# Patient Record
Sex: Female | Born: 1940 | Race: White | Hispanic: No | Marital: Married | Smoking: Never smoker
Health system: Southern US, Community
[De-identification: ages and names within clinical notes are randomized; demographics above are authoritative.]

## PROBLEM LIST (undated history)

## (undated) DIAGNOSIS — F32A Depression, unspecified: Secondary | ICD-10-CM

## (undated) DIAGNOSIS — C49A4 Gastrointestinal stromal tumor of large intestine: Secondary | ICD-10-CM

## (undated) DIAGNOSIS — R251 Tremor, unspecified: Secondary | ICD-10-CM

## (undated) DIAGNOSIS — Z87442 Personal history of urinary calculi: Secondary | ICD-10-CM

## (undated) DIAGNOSIS — K219 Gastro-esophageal reflux disease without esophagitis: Secondary | ICD-10-CM

## (undated) DIAGNOSIS — C801 Malignant (primary) neoplasm, unspecified: Secondary | ICD-10-CM

## (undated) DIAGNOSIS — F329 Major depressive disorder, single episode, unspecified: Secondary | ICD-10-CM

## (undated) DIAGNOSIS — K589 Irritable bowel syndrome without diarrhea: Secondary | ICD-10-CM

## (undated) DIAGNOSIS — F419 Anxiety disorder, unspecified: Secondary | ICD-10-CM

## (undated) DIAGNOSIS — D649 Anemia, unspecified: Secondary | ICD-10-CM

## (undated) DIAGNOSIS — M1990 Unspecified osteoarthritis, unspecified site: Secondary | ICD-10-CM

## (undated) DIAGNOSIS — I10 Essential (primary) hypertension: Secondary | ICD-10-CM

## (undated) DIAGNOSIS — Z8719 Personal history of other diseases of the digestive system: Secondary | ICD-10-CM

## (undated) HISTORY — PX: BREAST BIOPSY: SHX20

## (undated) HISTORY — PX: COLON SURGERY: SHX602

## (undated) HISTORY — PX: STOMACH SURGERY: SHX791

## (undated) HISTORY — PX: ABDOMINAL HYSTERECTOMY: SHX81

## (undated) HISTORY — PX: CHOLECYSTECTOMY: SHX55

---

## 2004-02-20 ENCOUNTER — Ambulatory Visit: Payer: Self-pay | Admitting: Internal Medicine

## 2005-02-24 ENCOUNTER — Ambulatory Visit: Payer: Self-pay | Admitting: Internal Medicine

## 2005-03-09 ENCOUNTER — Ambulatory Visit: Payer: Self-pay | Admitting: Internal Medicine

## 2005-11-03 ENCOUNTER — Ambulatory Visit: Payer: Self-pay | Admitting: Internal Medicine

## 2006-03-01 ENCOUNTER — Ambulatory Visit: Payer: Self-pay | Admitting: Internal Medicine

## 2007-03-07 ENCOUNTER — Ambulatory Visit: Payer: Self-pay | Admitting: Internal Medicine

## 2008-03-12 ENCOUNTER — Ambulatory Visit: Payer: Self-pay | Admitting: Internal Medicine

## 2008-08-21 ENCOUNTER — Ambulatory Visit: Payer: Self-pay | Admitting: Otolaryngology

## 2008-11-06 ENCOUNTER — Ambulatory Visit: Payer: Self-pay | Admitting: Internal Medicine

## 2008-12-25 ENCOUNTER — Encounter: Payer: Self-pay | Admitting: General Practice

## 2009-01-21 ENCOUNTER — Encounter: Payer: Self-pay | Admitting: General Practice

## 2009-03-14 ENCOUNTER — Ambulatory Visit: Payer: Self-pay | Admitting: Internal Medicine

## 2009-10-16 ENCOUNTER — Ambulatory Visit: Payer: Self-pay | Admitting: Obstetrics and Gynecology

## 2009-10-29 ENCOUNTER — Ambulatory Visit: Payer: Self-pay | Admitting: Obstetrics and Gynecology

## 2009-10-31 LAB — PATHOLOGY REPORT

## 2009-11-28 ENCOUNTER — Ambulatory Visit: Payer: Self-pay | Admitting: Obstetrics and Gynecology

## 2009-12-05 ENCOUNTER — Ambulatory Visit: Payer: Self-pay | Admitting: Obstetrics and Gynecology

## 2009-12-06 LAB — PATHOLOGY REPORT

## 2010-03-18 ENCOUNTER — Ambulatory Visit: Payer: Self-pay | Admitting: Internal Medicine

## 2011-04-07 ENCOUNTER — Ambulatory Visit: Payer: Self-pay | Admitting: Internal Medicine

## 2012-02-01 ENCOUNTER — Ambulatory Visit: Payer: Self-pay | Admitting: Unknown Physician Specialty

## 2012-02-03 LAB — PATHOLOGY REPORT

## 2012-04-15 ENCOUNTER — Ambulatory Visit: Payer: Self-pay | Admitting: Internal Medicine

## 2013-04-17 ENCOUNTER — Ambulatory Visit: Payer: Self-pay | Admitting: Internal Medicine

## 2013-11-14 DIAGNOSIS — M5030 Other cervical disc degeneration, unspecified cervical region: Secondary | ICD-10-CM | POA: Insufficient documentation

## 2013-11-14 DIAGNOSIS — K219 Gastro-esophageal reflux disease without esophagitis: Secondary | ICD-10-CM | POA: Insufficient documentation

## 2013-11-14 DIAGNOSIS — E785 Hyperlipidemia, unspecified: Secondary | ICD-10-CM | POA: Insufficient documentation

## 2014-01-29 ENCOUNTER — Ambulatory Visit: Payer: Self-pay | Admitting: Internal Medicine

## 2014-02-06 ENCOUNTER — Ambulatory Visit: Payer: Self-pay | Admitting: Oncology

## 2014-02-06 LAB — COMPREHENSIVE METABOLIC PANEL
ALT: 18 U/L
Albumin: 3.7 g/dL (ref 3.4–5.0)
Alkaline Phosphatase: 67 U/L
Anion Gap: 8 (ref 7–16)
BILIRUBIN TOTAL: 0.3 mg/dL (ref 0.2–1.0)
BUN: 15 mg/dL (ref 7–18)
CALCIUM: 9.2 mg/dL (ref 8.5–10.1)
CHLORIDE: 103 mmol/L (ref 98–107)
Co2: 31 mmol/L (ref 21–32)
Creatinine: 0.68 mg/dL (ref 0.60–1.30)
EGFR (Non-African Amer.): >60
GLUCOSE: 88 mg/dL (ref 65–99)
Osmolality: 283 (ref 275–301)
POTASSIUM: 3.6 mmol/L (ref 3.5–5.1)
SGOT(AST): 19 U/L (ref 15–37)
SODIUM: 142 mmol/L (ref 136–145)
Total Protein: 6.9 g/dL (ref 6.4–8.2)

## 2014-02-06 LAB — CBC CANCER CENTER
Basophil #: 0.1 x10 3/mm (ref 0.0–0.1)
Basophil %: 0.8 %
EOS ABS: 0.2 x10 3/mm (ref 0.0–0.7)
Eosinophil %: 3.2 %
HCT: 32.8 % — ABNORMAL LOW (ref 35.0–47.0)
HGB: 10.6 g/dL — AB (ref 12.0–16.0)
LYMPHS ABS: 2 x10 3/mm (ref 1.0–3.6)
Lymphocyte %: 29.5 %
MCH: 28.4 pg (ref 26.0–34.0)
MCHC: 32.3 g/dL (ref 32.0–36.0)
MCV: 88 fL (ref 80–100)
MONOS PCT: 6.1 %
Monocyte #: 0.4 x10 3/mm (ref 0.2–0.9)
Neutrophil #: 4.2 x10 3/mm (ref 1.4–6.5)
Neutrophil %: 60.4 %
Platelet: 352 x10 3/mm (ref 150–440)
RBC: 3.74 10*6/uL — AB (ref 3.80–5.20)
RDW: 15.2 % — AB (ref 11.5–14.5)
WBC: 6.9 x10 3/mm (ref 3.6–11.0)

## 2014-02-07 LAB — PROTIME-INR
INR: 1
Prothrombin Time: 13.1 secs (ref 11.5–14.7)

## 2014-02-07 LAB — CA 125: CA 125: 11.1 U/mL (ref 0.0–34.0)

## 2014-02-07 LAB — CEA: CEA: 1.4 ng/mL (ref 0.0–4.7)

## 2014-02-07 LAB — CANCER ANTIGEN 19-9: CA 19-9: 31 U/mL (ref 0–35)

## 2014-02-07 LAB — APTT: ACTIVATED PTT: 33.5 s (ref 23.6–35.9)

## 2014-02-07 LAB — LACTATE DEHYDROGENASE: LDH: 652 U/L — ABNORMAL HIGH (ref 81–246)

## 2014-02-16 ENCOUNTER — Inpatient Hospital Stay: Payer: Self-pay | Admitting: Specialist

## 2014-02-16 LAB — CBC
HCT: 31.2 % — ABNORMAL LOW (ref 35.0–47.0)
HGB: 10 g/dL — AB (ref 12.0–16.0)
MCH: 28.5 pg (ref 26.0–34.0)
MCHC: 32.2 g/dL (ref 32.0–36.0)
MCV: 89 fL (ref 80–100)
PLATELETS: 301 10*3/uL (ref 150–440)
RBC: 3.51 10*6/uL — ABNORMAL LOW (ref 3.80–5.20)
RDW: 14.8 % — ABNORMAL HIGH (ref 11.5–14.5)
WBC: 14.4 10*3/uL — ABNORMAL HIGH (ref 3.6–11.0)

## 2014-02-16 LAB — URINALYSIS, COMPLETE
BILIRUBIN, UR: NEGATIVE
BLOOD: NEGATIVE
Bacteria: NONE SEEN
Glucose,UR: NEGATIVE mg/dL (ref 0–75)
Ketone: NEGATIVE
Leukocyte Esterase: NEGATIVE
Nitrite: NEGATIVE
Ph: 5 (ref 4.5–8.0)
Protein: NEGATIVE
RBC,UR: 1 /HPF (ref 0–5)
SPECIFIC GRAVITY: 1.02 (ref 1.003–1.030)
Squamous Epithelial: 1
WBC UR: <1 /HPF (ref 0–5)

## 2014-02-16 LAB — COMPREHENSIVE METABOLIC PANEL
ALT: 36 U/L
ANION GAP: 6 — AB (ref 7–16)
Albumin: 3.4 g/dL (ref 3.4–5.0)
Alkaline Phosphatase: 77 U/L
BUN: 18 mg/dL (ref 7–18)
Bilirubin,Total: 0.8 mg/dL (ref 0.2–1.0)
CALCIUM: 8.7 mg/dL (ref 8.5–10.1)
CHLORIDE: 100 mmol/L (ref 98–107)
CREATININE: 0.79 mg/dL (ref 0.60–1.30)
Co2: 29 mmol/L (ref 21–32)
EGFR (African American): >60
EGFR (Non-African Amer.): >60
Glucose: 133 mg/dL — ABNORMAL HIGH (ref 65–99)
Osmolality: 274 (ref 275–301)
Potassium: 2.8 mmol/L — ABNORMAL LOW (ref 3.5–5.1)
SGOT(AST): 49 U/L — ABNORMAL HIGH (ref 15–37)
SODIUM: 135 mmol/L — AB (ref 136–145)
Total Protein: 6.9 g/dL (ref 6.4–8.2)

## 2014-02-16 LAB — LIPASE, BLOOD: LIPASE: 81 U/L (ref 73–393)

## 2014-02-16 LAB — CLOSTRIDIUM DIFFICILE(ARMC)

## 2014-02-16 LAB — TROPONIN I

## 2014-02-17 LAB — CBC WITH DIFFERENTIAL/PLATELET
BANDS NEUTROPHIL: 30 %
BANDS NEUTROPHIL: 23 %
BASOS ABS: 0 10*3/uL (ref 0.0–0.1)
Basophil %: 0.2 %
EOS ABS: 0.1 10*3/uL (ref 0.0–0.7)
Eosinophil %: 0.3 %
HCT: 28.4 % — ABNORMAL LOW (ref 35.0–47.0)
HCT: 26.7 % — ABNORMAL LOW (ref 35.0–47.0)
HCT: 28.1 % — AB (ref 35.0–47.0)
HGB: 9.1 g/dL — ABNORMAL LOW (ref 12.0–16.0)
HGB: 8.5 g/dL — AB (ref 12.0–16.0)
HGB: 8.8 g/dL — ABNORMAL LOW (ref 12.0–16.0)
LYMPHS PCT: 6 %
LYMPHS PCT: 9 %
LYMPHS PCT: 7.2 %
Lymphocyte #: 1.2 10*3/uL (ref 1.0–3.6)
MCH: 28.7 pg (ref 26.0–34.0)
MCH: 28.4 pg (ref 26.0–34.0)
MCH: 28.4 pg (ref 26.0–34.0)
MCHC: 31.9 g/dL — ABNORMAL LOW (ref 32.0–36.0)
MCHC: 31.9 g/dL — ABNORMAL LOW (ref 32.0–36.0)
MCHC: 31.4 g/dL — ABNORMAL LOW (ref 32.0–36.0)
MCV: 90 fL (ref 80–100)
MCV: 89 fL (ref 80–100)
MCV: 91 fL (ref 80–100)
MONO ABS: 0.8 x10 3/mm (ref 0.2–0.9)
MONOS PCT: 3 %
MONOS PCT: 4.7 %
Metamyelocyte: 7 %
Metamyelocyte: 7 %
Monocytes: 4 %
Myelocyte: 1 %
Neutrophil #: 14.1 10*3/uL — ABNORMAL HIGH (ref 1.4–6.5)
Neutrophil %: 87.6 %
PLATELETS: 270 10*3/uL (ref 150–440)
Platelet: 293 10*3/uL (ref 150–440)
Platelet: 273 10*3/uL (ref 150–440)
RBC: 3.16 10*6/uL — AB (ref 3.80–5.20)
RBC: 3.01 10*6/uL — AB (ref 3.80–5.20)
RBC: 3.1 10*6/uL — ABNORMAL LOW (ref 3.80–5.20)
RDW: 15.1 % — ABNORMAL HIGH (ref 11.5–14.5)
RDW: 15.2 % — ABNORMAL HIGH (ref 11.5–14.5)
RDW: 15.3 % — ABNORMAL HIGH (ref 11.5–14.5)
SEGMENTED NEUTROPHILS: 58 %
Segmented Neutrophils: 52 %
WBC: 9.4 10*3/uL (ref 3.6–11.0)
WBC: 13.4 10*3/uL — ABNORMAL HIGH (ref 3.6–11.0)
WBC: 16.1 10*3/uL — AB (ref 3.6–11.0)

## 2014-02-17 LAB — BASIC METABOLIC PANEL
ANION GAP: 9 (ref 7–16)
ANION GAP: 10 (ref 7–16)
ANION GAP: 10 (ref 7–16)
BUN: 21 mg/dL — ABNORMAL HIGH (ref 7–18)
BUN: 24 mg/dL — ABNORMAL HIGH (ref 7–18)
BUN: 24 mg/dL — ABNORMAL HIGH (ref 7–18)
CHLORIDE: 106 mmol/L (ref 98–107)
CO2: 25 mmol/L (ref 21–32)
CO2: 21 mmol/L (ref 21–32)
CREATININE: 1.35 mg/dL — AB (ref 0.60–1.30)
Calcium, Total: 7.1 mg/dL — ABNORMAL LOW (ref 8.5–10.1)
Calcium, Total: 7.3 mg/dL — ABNORMAL LOW (ref 8.5–10.1)
Calcium, Total: 7.1 mg/dL — ABNORMAL LOW (ref 8.5–10.1)
Chloride: 104 mmol/L (ref 98–107)
Chloride: 106 mmol/L (ref 98–107)
Co2: 22 mmol/L (ref 21–32)
Creatinine: 1.39 mg/dL — ABNORMAL HIGH (ref 0.60–1.30)
Creatinine: 1.14 mg/dL (ref 0.60–1.30)
EGFR (African American): 48 — ABNORMAL LOW
EGFR (African American): 49 — ABNORMAL LOW
EGFR (African American): >60
EGFR (Non-African Amer.): 50 — ABNORMAL LOW
GFR CALC NON AF AMER: 39 — AB
GFR CALC NON AF AMER: 41 — AB
GLUCOSE: 104 mg/dL — AB (ref 65–99)
Glucose: 99 mg/dL (ref 65–99)
Glucose: 128 mg/dL — ABNORMAL HIGH (ref 65–99)
OSMOLALITY: 281 (ref 275–301)
OSMOLALITY: 278 (ref 275–301)
Osmolality: 279 (ref 275–301)
POTASSIUM: 2.4 mmol/L — AB (ref 3.5–5.1)
Potassium: 3 mmol/L — ABNORMAL LOW (ref 3.5–5.1)
Potassium: 3.6 mmol/L (ref 3.5–5.1)
SODIUM: 138 mmol/L (ref 136–145)
SODIUM: 137 mmol/L (ref 136–145)
Sodium: 138 mmol/L (ref 136–145)

## 2014-02-17 LAB — FOLATE: FOLIC ACID: 12.5 ng/mL (ref 3.1–100.0)

## 2014-02-17 LAB — MAGNESIUM
MAGNESIUM: 1.8 mg/dL
Magnesium: 1.1 mg/dL — ABNORMAL LOW

## 2014-02-17 LAB — CLOSTRIDIUM DIFFICILE(ARMC)

## 2014-02-17 LAB — FERRITIN: FERRITIN (ARMC): 291 ng/mL (ref 8–388)

## 2014-02-17 LAB — IRON AND TIBC
Iron Bind.Cap.(Total): 199 ug/dL — ABNORMAL LOW (ref 250–450)
Iron Saturation: 4 %
Iron: 8 ug/dL — ABNORMAL LOW (ref 50–170)
Unbound Iron-Bind.Cap.: 191 ug/dL

## 2014-02-17 LAB — PROTIME-INR
INR: 1.6
Prothrombin Time: 19.1 secs — ABNORMAL HIGH (ref 11.5–14.7)

## 2014-02-17 LAB — LACTATE DEHYDROGENASE: LDH: 417 U/L — ABNORMAL HIGH (ref 81–246)

## 2014-02-17 LAB — TROPONIN I: Troponin-I: 0.03 ng/mL

## 2014-02-17 LAB — OCCULT BLOOD X 1 CARD TO LAB, STOOL: Occult Blood, Feces: POSITIVE

## 2014-02-18 LAB — CBC WITH DIFFERENTIAL/PLATELET
Basophil #: 0 10*3/uL (ref 0.0–0.1)
Basophil %: 0.2 %
Eosinophil #: 0.2 10*3/uL (ref 0.0–0.7)
Eosinophil %: 1.1 %
HCT: 25.1 % — AB (ref 35.0–47.0)
HGB: 8.1 g/dL — AB (ref 12.0–16.0)
LYMPHS PCT: 6.2 %
Lymphocyte #: 1 10*3/uL (ref 1.0–3.6)
MCH: 28.9 pg (ref 26.0–34.0)
MCHC: 32.2 g/dL (ref 32.0–36.0)
MCV: 90 fL (ref 80–100)
MONO ABS: 0.5 x10 3/mm (ref 0.2–0.9)
Monocyte %: 3.2 %
NEUTROS ABS: 13.8 10*3/uL — AB (ref 1.4–6.5)
Neutrophil %: 89.3 %
PLATELETS: 226 10*3/uL (ref 150–440)
RBC: 2.8 10*6/uL — AB (ref 3.80–5.20)
RDW: 15.7 % — AB (ref 11.5–14.5)
WBC: 15.4 10*3/uL — AB (ref 3.6–11.0)

## 2014-02-18 LAB — URINE CULTURE

## 2014-02-18 LAB — BASIC METABOLIC PANEL
Anion Gap: 8 (ref 7–16)
BUN: 25 mg/dL — ABNORMAL HIGH (ref 7–18)
CO2: 22 mmol/L (ref 21–32)
Calcium, Total: 7.1 mg/dL — ABNORMAL LOW (ref 8.5–10.1)
Chloride: 106 mmol/L (ref 98–107)
Creatinine: 0.98 mg/dL (ref 0.60–1.30)
EGFR (African American): >60
EGFR (Non-African Amer.): 59 — ABNORMAL LOW
Glucose: 126 mg/dL — ABNORMAL HIGH (ref 65–99)
Osmolality: 278 (ref 275–301)
Potassium: 3.9 mmol/L (ref 3.5–5.1)
Sodium: 136 mmol/L (ref 136–145)

## 2014-02-19 LAB — CBC WITH DIFFERENTIAL/PLATELET
Basophil #: 0 10*3/uL (ref 0.0–0.1)
Basophil %: 0.2 %
EOS ABS: 0.1 10*3/uL (ref 0.0–0.7)
Eosinophil %: 0.7 %
HCT: 19.4 % — ABNORMAL LOW (ref 35.0–47.0)
HGB: 6.1 g/dL — ABNORMAL LOW (ref 12.0–16.0)
Lymphocyte #: 0.9 10*3/uL — ABNORMAL LOW (ref 1.0–3.6)
Lymphocyte %: 7.4 %
MCH: 28.4 pg (ref 26.0–34.0)
MCHC: 31.5 g/dL — AB (ref 32.0–36.0)
MCV: 90 fL (ref 80–100)
MONO ABS: 0.5 x10 3/mm (ref 0.2–0.9)
Monocyte %: 3.8 %
NEUTROS ABS: 11.2 10*3/uL — AB (ref 1.4–6.5)
NEUTROS PCT: 87.9 %
Platelet: 169 10*3/uL (ref 150–440)
RBC: 2.16 10*6/uL — AB (ref 3.80–5.20)
RDW: 16 % — AB (ref 11.5–14.5)
WBC: 12.8 10*3/uL — ABNORMAL HIGH (ref 3.6–11.0)

## 2014-02-19 LAB — COMPREHENSIVE METABOLIC PANEL
ALT: 15 U/L
ANION GAP: 6 — AB (ref 7–16)
Albumin: 1.5 g/dL — ABNORMAL LOW (ref 3.4–5.0)
Alkaline Phosphatase: 76 U/L
BUN: 15 mg/dL (ref 7–18)
Bilirubin,Total: 0.5 mg/dL (ref 0.2–1.0)
CO2: 18 mmol/L — AB (ref 21–32)
CREATININE: 0.63 mg/dL (ref 0.60–1.30)
Calcium, Total: 6.1 mg/dL — CL (ref 8.5–10.1)
Chloride: 117 mmol/L — ABNORMAL HIGH (ref 98–107)
EGFR (African American): >60
EGFR (Non-African Amer.): >60
GLUCOSE: 155 mg/dL — AB (ref 65–99)
Osmolality: 285 (ref 275–301)
POTASSIUM: 6.3 mmol/L — AB (ref 3.5–5.1)
SGOT(AST): 18 U/L (ref 15–37)
SODIUM: 141 mmol/L (ref 136–145)
Total Protein: 4.2 g/dL — ABNORMAL LOW (ref 6.4–8.2)

## 2014-02-19 LAB — BASIC METABOLIC PANEL
Anion Gap: 7 (ref 7–16)
BUN: 16 mg/dL (ref 7–18)
CALCIUM: 7.7 mg/dL — AB (ref 8.5–10.1)
CHLORIDE: 109 mmol/L — AB (ref 98–107)
CREATININE: 0.84 mg/dL (ref 0.60–1.30)
Co2: 25 mmol/L (ref 21–32)
Glucose: 111 mg/dL — ABNORMAL HIGH (ref 65–99)
Osmolality: 283 (ref 275–301)
POTASSIUM: 3.8 mmol/L (ref 3.5–5.1)
SODIUM: 141 mmol/L (ref 136–145)

## 2014-02-20 ENCOUNTER — Ambulatory Visit: Payer: Self-pay | Admitting: Oncology

## 2014-02-20 LAB — COMPREHENSIVE METABOLIC PANEL
ALK PHOS: 141 U/L — AB
Albumin: 1.9 g/dL — ABNORMAL LOW (ref 3.4–5.0)
Anion Gap: 9 (ref 7–16)
BUN: 17 mg/dL (ref 7–18)
Bilirubin,Total: 1.2 mg/dL — ABNORMAL HIGH (ref 0.2–1.0)
Calcium, Total: 8 mg/dL — ABNORMAL LOW (ref 8.5–10.1)
Chloride: 108 mmol/L — ABNORMAL HIGH (ref 98–107)
Co2: 23 mmol/L (ref 21–32)
Creatinine: 0.83 mg/dL (ref 0.60–1.30)
EGFR (Non-African Amer.): >60
Glucose: 87 mg/dL (ref 65–99)
Osmolality: 280 (ref 275–301)
POTASSIUM: 3.3 mmol/L — AB (ref 3.5–5.1)
SGOT(AST): 24 U/L (ref 15–37)
SGPT (ALT): 15 U/L
Sodium: 140 mmol/L (ref 136–145)
Total Protein: 5.3 g/dL — ABNORMAL LOW (ref 6.4–8.2)

## 2014-02-20 LAB — CBC WITH DIFFERENTIAL/PLATELET
Bands: 3 %
HCT: 31.1 % — AB (ref 35.0–47.0)
HGB: 10.3 g/dL — ABNORMAL LOW (ref 12.0–16.0)
Lymphocytes: 17 %
MCH: 29.2 pg (ref 26.0–34.0)
MCHC: 33.2 g/dL (ref 32.0–36.0)
MCV: 88 fL (ref 80–100)
MONOS PCT: 4 %
Metamyelocyte: 1 %
Myelocyte: 1 %
Platelet: 183 10*3/uL (ref 150–440)
RBC: 3.53 10*6/uL — ABNORMAL LOW (ref 3.80–5.20)
RDW: 15.8 % — ABNORMAL HIGH (ref 11.5–14.5)
Segmented Neutrophils: 74 %
WBC: 12.4 10*3/uL — AB (ref 3.6–11.0)

## 2014-02-20 LAB — APTT: ACTIVATED PTT: 31 s (ref 23.6–35.9)

## 2014-02-20 LAB — PROTIME-INR
INR: 1.1
Prothrombin Time: 14 secs (ref 11.5–14.7)

## 2014-02-21 LAB — BASIC METABOLIC PANEL
Anion Gap: 8 (ref 7–16)
BUN: 19 mg/dL — ABNORMAL HIGH (ref 7–18)
CALCIUM: 8.1 mg/dL — AB (ref 8.5–10.1)
CHLORIDE: 107 mmol/L (ref 98–107)
Co2: 24 mmol/L (ref 21–32)
Creatinine: 0.82 mg/dL (ref 0.60–1.30)
EGFR (Non-African Amer.): >60
GLUCOSE: 106 mg/dL — AB (ref 65–99)
OSMOLALITY: 280 (ref 275–301)
Potassium: 3 mmol/L — ABNORMAL LOW (ref 3.5–5.1)
Sodium: 139 mmol/L (ref 136–145)

## 2014-02-21 LAB — CBC WITH DIFFERENTIAL/PLATELET
Bands: 2 %
EOS PCT: 1 %
HCT: 33.1 % — ABNORMAL LOW (ref 35.0–47.0)
HGB: 11 g/dL — AB (ref 12.0–16.0)
Lymphocytes: 13 %
MCH: 29.2 pg (ref 26.0–34.0)
MCHC: 33.3 g/dL (ref 32.0–36.0)
MCV: 88 fL (ref 80–100)
METAMYELOCYTE: 5 %
MONOS PCT: 7 %
Myelocyte: 2 %
Platelet: 175 10*3/uL (ref 150–440)
RBC: 3.77 10*6/uL — ABNORMAL LOW (ref 3.80–5.20)
RDW: 16.1 % — AB (ref 11.5–14.5)
Segmented Neutrophils: 70 %
WBC: 12.3 10*3/uL — AB (ref 3.6–11.0)

## 2014-02-21 LAB — CULTURE, BLOOD (SINGLE)

## 2014-02-22 LAB — CBC WITH DIFFERENTIAL/PLATELET
Bands: 3 %
Basophil: 1 %
Eosinophil: 2 %
HCT: 35.1 % (ref 35.0–47.0)
HGB: 11.5 g/dL — ABNORMAL LOW (ref 12.0–16.0)
Lymphocytes: 11 %
MCH: 29 pg (ref 26.0–34.0)
MCHC: 32.9 g/dL (ref 32.0–36.0)
MCV: 88 fL (ref 80–100)
Metamyelocyte: 1 %
Monocytes: 7 %
Myelocyte: 4 %
Platelet: 208 10*3/uL (ref 150–440)
RBC: 3.98 10*6/uL (ref 3.80–5.20)
RDW: 16.1 % — ABNORMAL HIGH (ref 11.5–14.5)
Segmented Neutrophils: 71 %
WBC: 17.1 10*3/uL — ABNORMAL HIGH (ref 3.6–11.0)

## 2014-02-22 LAB — BASIC METABOLIC PANEL
Anion Gap: 11 (ref 7–16)
BUN: 14 mg/dL (ref 7–18)
CREATININE: 0.45 mg/dL — AB (ref 0.60–1.30)
Calcium, Total: 5.6 mg/dL — CL (ref 8.5–10.1)
Chloride: 117 mmol/L — ABNORMAL HIGH (ref 98–107)
Co2: 17 mmol/L — ABNORMAL LOW (ref 21–32)
EGFR (African American): >60
Glucose: 75 mg/dL (ref 65–99)
OSMOLALITY: 288 (ref 275–301)
Potassium: 2.3 mmol/L — CL (ref 3.5–5.1)
SODIUM: 145 mmol/L (ref 136–145)

## 2014-02-22 LAB — MAGNESIUM: Magnesium: 1.1 mg/dL — ABNORMAL LOW

## 2014-02-26 LAB — CBC CANCER CENTER
Basophil #: 0 x10 3/mm (ref 0.0–0.1)
Basophil %: 0.2 %
EOS ABS: 0.1 x10 3/mm (ref 0.0–0.7)
EOS PCT: 0.6 %
HCT: 35.2 % (ref 35.0–47.0)
HGB: 11.5 g/dL — AB (ref 12.0–16.0)
LYMPHS ABS: 1.7 x10 3/mm (ref 1.0–3.6)
Lymphocyte %: 10 %
MCH: 28.7 pg (ref 26.0–34.0)
MCHC: 32.7 g/dL (ref 32.0–36.0)
MCV: 88 fL (ref 80–100)
MONO ABS: 0.7 x10 3/mm (ref 0.2–0.9)
MONOS PCT: 4 %
Neutrophil #: 14.6 x10 3/mm — ABNORMAL HIGH (ref 1.4–6.5)
Neutrophil %: 85.2 %
PLATELETS: 399 x10 3/mm (ref 150–440)
RBC: 4.01 10*6/uL (ref 3.80–5.20)
RDW: 16.4 % — ABNORMAL HIGH (ref 11.5–14.5)
WBC: 17.2 x10 3/mm — ABNORMAL HIGH (ref 3.6–11.0)

## 2014-02-26 LAB — COMPREHENSIVE METABOLIC PANEL
ALBUMIN: 2.3 g/dL — AB (ref 3.4–5.0)
ALK PHOS: 126 U/L — AB
Anion Gap: 9 (ref 7–16)
BILIRUBIN TOTAL: 0.3 mg/dL (ref 0.2–1.0)
BUN: 8 mg/dL (ref 7–18)
CO2: 28 mmol/L (ref 21–32)
Calcium, Total: 8.7 mg/dL (ref 8.5–10.1)
Chloride: 102 mmol/L (ref 98–107)
Creatinine: 0.75 mg/dL (ref 0.60–1.30)
EGFR (Non-African Amer.): >60
Glucose: 92 mg/dL (ref 65–99)
Osmolality: 276 (ref 275–301)
POTASSIUM: 4.4 mmol/L (ref 3.5–5.1)
SGOT(AST): 22 U/L (ref 15–37)
SGPT (ALT): 19 U/L
Sodium: 139 mmol/L (ref 136–145)
Total Protein: 6.1 g/dL — ABNORMAL LOW (ref 6.4–8.2)

## 2014-03-01 ENCOUNTER — Ambulatory Visit: Payer: Self-pay | Admitting: Oncology

## 2014-03-09 ENCOUNTER — Ambulatory Visit: Payer: Self-pay | Admitting: Gastroenterology

## 2014-03-14 LAB — CBC CANCER CENTER
Basophil #: 0 x10 3/mm (ref 0.0–0.1)
Basophil %: 0.6 %
EOS PCT: 4.2 %
Eosinophil #: 0.3 x10 3/mm (ref 0.0–0.7)
HCT: 34 % — ABNORMAL LOW (ref 35.0–47.0)
HGB: 11.1 g/dL — ABNORMAL LOW (ref 12.0–16.0)
Lymphocyte #: 1.7 x10 3/mm (ref 1.0–3.6)
Lymphocyte %: 23.2 %
MCH: 29 pg (ref 26.0–34.0)
MCHC: 32.6 g/dL (ref 32.0–36.0)
MCV: 89 fL (ref 80–100)
MONOS PCT: 7.5 %
Monocyte #: 0.5 x10 3/mm (ref 0.2–0.9)
Neutrophil #: 4.7 x10 3/mm (ref 1.4–6.5)
Neutrophil %: 64.5 %
PLATELETS: 361 x10 3/mm (ref 150–440)
RBC: 3.82 10*6/uL (ref 3.80–5.20)
RDW: 16.5 % — AB (ref 11.5–14.5)
WBC: 7.2 x10 3/mm (ref 3.6–11.0)

## 2014-03-23 ENCOUNTER — Ambulatory Visit: Payer: Self-pay | Admitting: Oncology

## 2014-04-02 LAB — CBC CANCER CENTER
BASOS ABS: 0.1 x10 3/mm (ref 0.0–0.1)
BASOS PCT: 0.8 %
Eosinophil #: 0.2 x10 3/mm (ref 0.0–0.7)
Eosinophil %: 1.4 %
HCT: 32.4 % — AB (ref 35.0–47.0)
HGB: 10.7 g/dL — AB (ref 12.0–16.0)
Lymphocyte #: 1.9 x10 3/mm (ref 1.0–3.6)
Lymphocyte %: 17.4 %
MCH: 29.2 pg (ref 26.0–34.0)
MCHC: 33.1 g/dL (ref 32.0–36.0)
MCV: 88 fL (ref 80–100)
Monocyte #: 0.7 x10 3/mm (ref 0.2–0.9)
Monocyte %: 6.5 %
NEUTROS ABS: 8 x10 3/mm — AB (ref 1.4–6.5)
Neutrophil %: 73.9 %
Platelet: 480 x10 3/mm — ABNORMAL HIGH (ref 150–440)
RBC: 3.68 10*6/uL — ABNORMAL LOW (ref 3.80–5.20)
RDW: 15.8 % — ABNORMAL HIGH (ref 11.5–14.5)
WBC: 10.8 x10 3/mm (ref 3.6–11.0)

## 2014-04-02 LAB — COMPREHENSIVE METABOLIC PANEL
ALT: 17 U/L
AST: 16 U/L (ref 15–37)
Albumin: 2.4 g/dL — ABNORMAL LOW (ref 3.4–5.0)
Alkaline Phosphatase: 75 U/L
Anion Gap: 8 (ref 7–16)
BUN: 12 mg/dL (ref 7–18)
Bilirubin,Total: 0.2 mg/dL (ref 0.2–1.0)
CALCIUM: 7.7 mg/dL — AB (ref 8.5–10.1)
CHLORIDE: 101 mmol/L (ref 98–107)
Co2: 29 mmol/L (ref 21–32)
Creatinine: 0.87 mg/dL (ref 0.60–1.30)
EGFR (African American): >60
EGFR (Non-African Amer.): >60
Glucose: 118 mg/dL — ABNORMAL HIGH (ref 65–99)
Osmolality: 277 (ref 275–301)
POTASSIUM: 3.1 mmol/L — AB (ref 3.5–5.1)
Sodium: 138 mmol/L (ref 136–145)
Total Protein: 5.9 g/dL — ABNORMAL LOW (ref 6.4–8.2)

## 2014-04-04 DIAGNOSIS — C49A0 Gastrointestinal stromal tumor, unspecified site: Secondary | ICD-10-CM | POA: Insufficient documentation

## 2014-04-04 DIAGNOSIS — C269 Malignant neoplasm of ill-defined sites within the digestive system: Secondary | ICD-10-CM | POA: Insufficient documentation

## 2014-04-23 ENCOUNTER — Ambulatory Visit: Payer: Self-pay | Admitting: Oncology

## 2014-04-23 LAB — COMPREHENSIVE METABOLIC PANEL WITH GFR
Albumin: 2.5 g/dL — ABNORMAL LOW
Alkaline Phosphatase: 68 U/L
Anion Gap: 6 — ABNORMAL LOW
BUN: 11 mg/dL
Bilirubin,Total: 0.3 mg/dL
Calcium, Total: 7.9 mg/dL — ABNORMAL LOW
Chloride: 103 mmol/L
Co2: 33 mmol/L — ABNORMAL HIGH
Creatinine: 0.81 mg/dL
EGFR (African American): >60
EGFR (Non-African Amer.): >60
Glucose: 131 mg/dL — ABNORMAL HIGH
Osmolality: 284
Potassium: 3.1 mmol/L — ABNORMAL LOW
SGOT(AST): 19 U/L
SGPT (ALT): 12 U/L — ABNORMAL LOW
Sodium: 142 mmol/L
Total Protein: 6 g/dL — ABNORMAL LOW

## 2014-04-23 LAB — CBC CANCER CENTER
Basophil #: 0.1 10*3/uL
Basophil %: 1 %
Eosinophil #: 0.1 10*3/uL
Eosinophil %: 2.6 %
HCT: 30.2 % — ABNORMAL LOW
HGB: 10.1 g/dL — ABNORMAL LOW
Lymphocyte %: 27.4 %
Lymphs Abs: 1.5 10*3/uL
MCH: 30.6 pg
MCHC: 33.3 g/dL
MCV: 92 fL
Monocyte #: 0.4 10*3/uL
Monocyte %: 8 %
Neutrophil #: 3.3 10*3/uL
Neutrophil %: 61 %
Platelet: 246 10*3/uL
RBC: 3.29 10*6/uL — ABNORMAL LOW
RDW: 18.8 % — ABNORMAL HIGH
WBC: 5.5 10*3/uL

## 2014-04-23 LAB — IRON AND TIBC
Iron Bind.Cap.(Total): 201 ug/dL — ABNORMAL LOW
Iron Saturation: 28 %
Iron: 57 ug/dL
Unbound Iron-Bind.Cap.: 144 ug/dL

## 2014-04-23 LAB — FERRITIN: Ferritin (ARMC): 124 ng/mL

## 2014-04-25 ENCOUNTER — Ambulatory Visit: Payer: Self-pay | Admitting: Internal Medicine

## 2014-05-11 DIAGNOSIS — K5792 Diverticulitis of intestine, part unspecified, without perforation or abscess without bleeding: Secondary | ICD-10-CM | POA: Insufficient documentation

## 2014-05-22 ENCOUNTER — Ambulatory Visit
Admit: 2014-05-22 | Disposition: A | Discharge status: Home / Self Care | Payer: Self-pay | Attending: Oncology | Admitting: Oncology

## 2014-06-22 ENCOUNTER — Ambulatory Visit
Admit: 2014-06-22 | Disposition: A | Discharge status: Home / Self Care | Payer: Self-pay | Attending: Oncology | Admitting: Oncology

## 2014-07-05 LAB — CBC CANCER CENTER
Basophil #: 0.1 x10 3/mm (ref 0.0–0.1)
Basophil %: 1 %
EOS PCT: 1.6 %
Eosinophil #: 0.1 x10 3/mm (ref 0.0–0.7)
HCT: 34.5 % — ABNORMAL LOW (ref 35.0–47.0)
HGB: 11.6 g/dL — ABNORMAL LOW (ref 12.0–16.0)
LYMPHS ABS: 1.3 x10 3/mm (ref 1.0–3.6)
Lymphocyte %: 19.6 %
MCH: 30.1 pg (ref 26.0–34.0)
MCHC: 33.7 g/dL (ref 32.0–36.0)
MCV: 90 fL (ref 80–100)
MONO ABS: 0.6 x10 3/mm (ref 0.2–0.9)
Monocyte %: 8.3 %
NEUTROS ABS: 4.7 x10 3/mm (ref 1.4–6.5)
Neutrophil %: 69.5 %
Platelet: 532 x10 3/mm — ABNORMAL HIGH (ref 150–440)
RBC: 3.86 10*6/uL (ref 3.80–5.20)
RDW: 15.6 % — ABNORMAL HIGH (ref 11.5–14.5)
WBC: 6.8 x10 3/mm (ref 3.6–11.0)

## 2014-07-05 LAB — COMPREHENSIVE METABOLIC PANEL
ALBUMIN: 3 g/dL — AB
ALT: 10 U/L — AB
AST: 18 U/L
Alkaline Phosphatase: 94 U/L
Anion Gap: 8 (ref 7–16)
BILIRUBIN TOTAL: 0.6 mg/dL
BUN: 9 mg/dL
CREATININE: 0.59 mg/dL
Calcium, Total: 8.8 mg/dL — ABNORMAL LOW
Chloride: 97 mmol/L — ABNORMAL LOW
Co2: 33 mmol/L — ABNORMAL HIGH
Glucose: 134 mg/dL — ABNORMAL HIGH
POTASSIUM: 2.9 mmol/L — AB
Sodium: 138 mmol/L
Total Protein: 6.5 g/dL

## 2014-07-05 LAB — MAGNESIUM: MAGNESIUM: 1.1 mg/dL — AB

## 2014-07-12 LAB — CBC CANCER CENTER
BASOS PCT: 0.8 %
Basophil #: 0 x10 3/mm (ref 0.0–0.1)
Eosinophil #: 0.1 x10 3/mm (ref 0.0–0.7)
Eosinophil %: 2.2 %
HCT: 36.3 % (ref 35.0–47.0)
HGB: 12 g/dL (ref 12.0–16.0)
LYMPHS ABS: 1.3 x10 3/mm (ref 1.0–3.6)
Lymphocyte %: 21.3 %
MCH: 30.2 pg (ref 26.0–34.0)
MCHC: 33 g/dL (ref 32.0–36.0)
MCV: 92 fL (ref 80–100)
MONO ABS: 0.4 x10 3/mm (ref 0.2–0.9)
Monocyte %: 6.5 %
Neutrophil #: 4.1 x10 3/mm (ref 1.4–6.5)
Neutrophil %: 69.2 %
Platelet: 431 x10 3/mm (ref 150–440)
RBC: 3.96 10*6/uL (ref 3.80–5.20)
RDW: 16.2 % — ABNORMAL HIGH (ref 11.5–14.5)
WBC: 5.9 x10 3/mm (ref 3.6–11.0)

## 2014-07-12 LAB — COMPREHENSIVE METABOLIC PANEL
ALK PHOS: 73 U/L
ALT: 11 U/L — AB
Albumin: 3.3 g/dL — ABNORMAL LOW
Anion Gap: 5 — ABNORMAL LOW (ref 7–16)
BILIRUBIN TOTAL: 0.5 mg/dL
BUN: 13 mg/dL
CREATININE: 0.49 mg/dL
Calcium, Total: 9.1 mg/dL
Chloride: 104 mmol/L
Co2: 31 mmol/L
EGFR (African American): >60
EGFR (Non-African Amer.): >60
GLUCOSE: 100 mg/dL — AB
POTASSIUM: 3.1 mmol/L — AB
SGOT(AST): 15 U/L
SODIUM: 140 mmol/L
TOTAL PROTEIN: 6.8 g/dL

## 2014-07-12 LAB — MAGNESIUM: MAGNESIUM: 1.3 mg/dL — AB

## 2014-07-14 NOTE — H&P (Signed)
PATIENT NAME:  Courtney [name], Courtney [name] MR#:  [id number] DATE OF BIRTH:  [birth year]  DATE OF ADMISSION:  02/16/2014  PRIMARY CARE PHYSICIAN:  Dr. Emily Filbert.   CHIEF COMPLAINT: Altered mental status and fever.   HISTORY OF PRESENT ILLNESS: This is a 74 year old female who was sent over to the ER from special [facility] due to fever and altered mental status. The patient herself presently is not a good historian, therefore most of the history obtained from the son at bedside. As per the son the patient was in her usual state of health and doing well yesterday evening when he had dinner with her. She was recently diagnosed with an intra-abdominal mass and was going to get a biopsy done today at [facility]. Prior to starting the procedure the patient was lethargic, noted to be febrile and altered. She was therefore sent over to the ER for further evaluation. In the ER the patient had a fever of 101, was noted to be tachycardic and tachypneic, diagnosed with suspected sepsis but the source still being unclear. Hospitalist services were contacted for treatment and evaluation.   REVIEW OF SYSTEMS:   CONSTITUTIONAL: Positive documented fever of 101. No weight gain, no weight loss.  EYES: No blurry or double vision.  EARS, NOSE, AND THROAT: No tinnitus. No postnasal drip. No redness of the oropharynx.  RESPIRATORY: No cough, no wheeze, no hemoptysis, no dyspnea.  CARDIOVASCULAR: No chest pain, no orthopnea, no palpitation, no syncope.  GASTROINTESTINAL: No nausea. No vomiting. No diarrhea. Positive abdominal pain. No melena or hematochezia.  GENITOURINARY: No dysuria or hematuria.  ENDOCRINE: No polyuria, nocturia, heat or intolerance.  HEMATOLOGIC: No anemia, no bruising, no bleeding.  INTEGUMENTARY: No rashes. No lesions.  MUSCULOSKELETAL: No arthritis. No swelling. No gout.  NEUROLOGIC: No numbness, tingling, or ataxia. No seizure-type activity.   PSYCHIATRIC: No anxiety, no insomnia. No ADD.   PAST  MEDICAL HISTORY: Consistent with hypertension, GERD, hyperlipidemia, recently diagnosed intra-abdominal mass.   ALLERGIES: PENICILLIN, WHICH CAUSES HIVES.   SOCIAL HISTORY: No smoking. No alcohol abuse. No illicit drug abuse. Lives with her husband.   FAMILY HISTORY: Mother and father are both deceased. They both died from complications of heart disease. Diabetes does run in the mother's side of family.   CURRENT MEDICATIONS: Hydrochlorothiazide/lisinopril 12.5/20 one tab daily, Nexium 40 mg daily, Ocuvite 2 tabs daily, simvastatin 20 mg daily, vitamin B12 500 mcg b.i.d., vitamin D3 1000 international units daily, Zyrtec 10 mg daily.   PHYSICAL EXAMINATION:  VITAL SIGNS:  Temperature is 101.3, pulse 113, respirations 28, blood pressure 135/54, saturation is 94% on room air.  GENERAL: The patient is a pleasant-appearing female, confused, in mild distress.  HEAD, EYES, EARS, NOSE, THROAT:  She is atraumatic, normocephalic. Her extraocular muscles are intact. Pupils reactive to light. Her sclerae anicteric. No conjunctival injection. No oropharyngeal erythema.  NECK: Supple. There is no jugular venous distention. No bruits, no lymphadenopathy, no thyromegaly.  HEART: Regular rate and rhythm, tachycardic. She does have a 2/6 systolic ejection murmur heard at the apex.  LUNGS: Clear to auscultation bilaterally. No rales or rhonchi. No wheezes.  ABDOMEN: Soft, flat, nontender, nondistended. Has good bowel sounds. No hepatosplenomegaly appreciated.  EXTREMITIES: No evidence of any cyanosis, clubbing, or peripheral edema. Has + 2 pedal and radial pulses bilaterally.  NEUROLOGICAL: The patient is alert, awake, oriented x 1. Difficult to do full neurological exam as she is confused and encephalopathic. Moves all extremities spontaneously. No focal motor or sensory deficits appreciated  bilaterally.  SKIN: Moist, warm, with no rashes.  LYMPHATIC: There is no cervical or axillary lymphadenopathy.    LABORATORY DATA: Serum glucose of 133, BUN 18, creatinine 0.7, sodium 135, potassium 2.8, chloride 100, bicarbonate 29. LFTs are within normal limits. Troponin less than 0.02. White cell count 14.4, hemoglobin 10.0, hematocrit 31.2, platelet count of 301,000. Urinalysis within normal limits.   The patient did have a chest x-ray done which showed no focal airspace consolidation, moderate size hiatal hernia.  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   ASSESSMENT AND PLAN: This is a 74 year old female with history of hypertension, gastroesophageal reflux disease, hyperlipidemia, who was sent over from special procedures to the Emergency Room due to altered mental status and confusion.   1.  Altered mental status. This is likely metabolic encephalopathy from the sepsis as patient presented with fever, tachycardia, and leukocytosis. I will get a CT head to rule out brain metastases as she does have intra-abdominal mass which is thought to be malignant, but the diagnosis is still unclear. I will empirically start the patient on IV fluids and also IV broad-spectrum antibiotics with Zosyn and follow her mental status.  2.  Sepsis. The patient presented with fever, tachycardia, and leukocytosis. The source of the sepsis is unclear. Her chest x-ray is negative for pneumonia. Urinalysis is negative. Given her intra-abdominal mass I suspect the source is likely GI in nature, maybe it is the mass itself. I will empirically start her on IV Zosyn, follow blood cultures, follow fever curve, follow hemodynamics, continue IV fluids.  3.  Hypertension. The patient is presently  hemodynamically stable. I will hold antihypertensives given her sepsis.  4.  Gastroesophageal reflux disease. Continue Protonix.  5.  Hyperlipidemia. Continue simvastatin.  6.  Hypokalemia. We will replace her potassium accordingly and repeat in the morning.   CODE STATUS: The patient is a full code.   The patient will be transferred over to Dr. Myrtis Ser service.   TIME SPENT: 50 minutes.     ____________________________ Belia Heman. Verdell Carmine, MD vjs:bu D: 02/16/2014 13:58:26 ET T: 02/16/2014 14:28:41 ET JOB#: [id number]  cc: Belia Heman. Verdell Carmine, MD, <Dictator> Henreitta Leber MD ELECTRONICALLY SIGNED 02/24/2014 15:57

## 2014-07-14 NOTE — Consult Note (Signed)
Details:   - GI Note:  s/p CT guided biopsy of omental mass today.   Radiologist noted new gas in the mass.  One dark stool this am.  Hgb stable.  She denies abd pain, n/v.   Exam: vss Gen: a and o x 3, no distress abd: still distended but not tight, decrased bowels sounds, no r/g, site draiing some serosanguinous fluid  A/P: 1) anemia, dark stools.   If hgb drops again, in conjunction with the dark stools, will perform EGD.  However, Hgb appears quite stable currently.  2.) New gas noted in omental mass - will obtain abdominal films to eval for free air.   f/u path Will follow.   Electronic Signatures: Arther Dames (MD)  (Signed 02-Dec-15 17:45)  Authored: Details   Last Updated: 02-Dec-15 17:45 by Arther Dames (MD)

## 2014-07-14 NOTE — Consult Note (Signed)
Chief Complaint:  Subjective/Chief Complaint Patient denies being confused but thinks it is the year 1915 and [facility] She had a KUB that was normal. Still with some LLQ abd pain.   VITAL SIGNS/ANCILLARY NOTES: **Vital Signs.:   29-Nov-15 00:11  Vital Signs Type Routine    05:00  Vital Signs Type Routine  Temperature Temperature (F) 99.1  Celsius 37.2  Temperature Source oral  Pulse Pulse 101  Respirations Respirations 23  Systolic BP Systolic BP 92  Diastolic BP (mmHg) Diastolic BP (mmHg) 48  Mean BP 62  Pulse Ox % Pulse Ox % 99  Oxygen Delivery 2L; Nasal Cannula   Brief Assessment:  GEN well developed, well nourished, no acute distress   Cardiac Regular   Respiratory normal resp effort  clear BS  no use of accessory muscles   Gastrointestinal details normal Soft  Mild tenderness on LLQ   EXTR negative cyanosis/clubbing   Lab Results: Routine Chem:  29-Nov-15 04:23   Glucose, Serum  126  BUN  25  Creatinine (comp) 0.98  Sodium, Serum 136  Potassium, Serum 3.9  Chloride, Serum 106  CO2, Serum 22  Calcium (Total), Serum  7.1  Anion Gap 8  Osmolality (calc) 278  eGFR (African American) >60  eGFR (Non-African American)  59 (eGFR values <24m/min/1.73 m2 may be an indication of chronic kidney disease (CKD). Calculated eGFR, using the MRDR Study equation, is useful in  patients with stable renal function. The eGFR calculation will not be reliable in acutely ill patients when serum creatinine is changing rapidly. It is not useful in patients on dialysis. The eGFR calculation may not be applicable to patients at the low and high extremes of body sizes, pregnant women, and vegetarians.)  Result Comment LABS - This specimen was collected through an   - indwelling catheter or arterial line.  - A minimum of 550m of blood was wasted prior    - to collecting the sample.  Interpret  - results with caution.  Result(s) reported on 18 Feb 2014 at 04:42AM.  Routine Hem:  29-Nov-15 04:23   WBC (CBC)  15.4  RBC (CBC)  2.80  Hemoglobin (CBC)  8.1  Hematocrit (CBC)  25.1  Platelet Count (CBC) 226  MCV 90  MCH 28.9  MCHC 32.2  RDW  15.7  Neutrophil % 89.3  Lymphocyte % 6.2  Monocyte % 3.2  Eosinophil % 1.1  Basophil % 0.2  Neutrophil #  13.8  Lymphocyte # 1.0  Monocyte # 0.5  Eosinophil # 0.2  Basophil # 0.0   Assessment/Plan:  Assessment/Plan:  Assessment Abdominal pain with known mesenteric mass. KUB did not show any sign of obstruction.   Plan Awaiting biopsy of mass for pathology.  No sign of obstruction.  Appears less confused then discribed on admission.   Electronic Signatures: WoLucilla LameMD)  (Signed [id number])  Authored: Chief Complaint, VITAL SIGNS/ANCILLARY NOTES, Brief Assessment, Lab Results, Assessment/Plan   Last Updated: 29-Nov-15 06:49 by WoLucilla LameMD)

## 2014-07-14 NOTE — Consult Note (Signed)
PATIENT NAME:  Courtney [name], Courtney [name] MR#:  [id number] DATE OF BIRTH:  1940-06-28  DATE OF CONSULTATION:  02/17/2014  REFERRING PHYSICIAN:  Tracie Harrier, MD  CONSULTING PHYSICIAN:  Arther Dames, MD  REASON FOR CONSULTATION: Mesenteric mass.   HISTORY OF PRESENT ILLNESS: Courtney [name] is a 74 year old female with a past medical history notable for hypertension, known mesenteric mass, who actually was brought to the Emergency Room in the setting of being altered and hypotensive. She was actually supposed to get a biopsy of the known mesenteric mass but unfortunately was too ill to have this done. She appeared to be septic and was admitted to the CCU for further management.   Here in the hospital she continues to be tachycardic and somewhat hypotensive. It seems her altered mental status has improved some.   PAST MEDICAL HISTORY:  1.  Hypertension.  2.  GERD.  3.  Hyperlipidemia.  4.  Mesenteric mass.   PAST SURGICAL HISTORY: Unknown.   ALLERGIES: PENICILLIN, WHICH CAUSES HIVES.   SOCIAL HISTORY: She denies alcohol or smoking.   FAMILY HISTORY: No family history of GI malignancy.   HOME MEDICATIONS:  1.  Hydrochlorothiazide/lisinopril 12.5/20, 1 tab daily.  2.  Nexium 40 mg daily.  3.  Ocuvite 2 tabs daily. 4.  Simvastatin 20 mg daily.  5.  Vitamin B12, 500 mg b.i.d.  6.  Vitamin D3, 1000 units daily.  7.  Zyrtec 10 mg daily.  REVIEW OF SYSTEMS: A 10-system review was completed. It is negative except as stated in the HPI.   PHYSICAL EXAMINATION:  VITAL SIGNS: Temperature is currently 99, heart rate is 137, respirations are 29, blood pressure is 98/50, pulse oximetry is 96% on 2 L. GENERAL: Appears to be in mild distress. She is alert and oriented x 3. Appears stated age. HEENT: Normocephalic/atraumatic. Extraocular movements are intact. Anicteric. NECK: Soft, supple. JVP appears normal. No adenopathy. CHEST: Clear to auscultation. No wheeze or crackle. Respirations unlabored. HEART:  Regular. No murmur, rub, or gallop.  Normal S1 and S2. ABDOMEN: Positive distention, but not taut. There is significant tympany to percussion. There is also significant tenderness to palpation with some guarding. EXTREMITIES: No swelling, well perfused. SKIN: No rash or lesion. Skin color, texture, turgor normal. NEUROLOGICAL: Grossly intact. PSYCHIATRIC: Normal tone and affect. MUSCULOSKELETAL: No joint swelling or erythema.   LABORATORY DATA: Currently her sodium is 137, potassium is 3, BUN is 24, creatinine 1.14. Currently white count is 16, hemoglobin is 8.8, hematocrit is 28, platelets are 273,000. She is 87% neutrophils. INR is 1.6. Clostridium difficile is negative. FOBT is positive. Her urine is unremarkable. Lactic acid is currently 1.4.  IMAGING: She had a CT scan showing a mesenteric mass with compression effect on the small bowel.   ASSESSMENT AND PLAN: 1.  Mesenteric mass: I do agree with a CT-guided biopsy of this. Hopefully this will be a core biopsy to help establish the diagnosis of a lymphoma.  2.  Hypotension, tachycardia: I do suspect, it does appear, I agree she is septic. She does have elevated white count with neutrophil predominance. I agree with broad-spectrum antibiotics, as she continues to appear to be ill at this time.  3.  Tympanic abdomen with significant tenderness to palpation. I am worried about the mass effect causing a possible small-bowel obstruction. I am going to go ahead and check a KUB on her at this time to evaluate for evidence of small-bowel dilation. I will also go ahead and check a lactic acid.  Further recommendations pending above. Will continue to follow. Thank you for this consult.    ____________________________ Arther Dames, MD mr:ST D: 02/17/2014 16:37:22 ET T: 02/17/2014 22:16:38 ET JOB#: [id number]  cc: Arther Dames, MD, <Dictator> Mellody Life MD ELECTRONICALLY SIGNED 02/20/2014 18:05

## 2014-07-14 NOTE — Consult Note (Signed)
Note Type Consult   Subjective: Chief Complaint/Diagnosis:   omental mass, concerning for malignancy. Hypotension likely secondary to sepsis. HPI:   Patient is a 74 year old female who was about to undergo a CT-guided biopsy for omental mass concerning for underlying lymphoma when she is noted to be very lethargic and have hypotension. Her biopsy was not performed and she was transferred the ER and found to have symptoms concerning for sepsis. She currently is in the intensive care unit still requiring pressors, but states she feels improved since admission. She is no further neurologic complaints. She has a fair appetite. She denies any fevers. She has mild abdominal pain. She has no chest pain or shortness of breath. She denies any nausea, vomiting, constipation, or diarrhea. She has no urinary complaints. Patient otherwise feels well and offers no further specific complaints.   Review of Systems:  Performance Status (ECOG): 2  Review of Systems:   As per HPI. Otherwise, 10 point system review was negative.   Allergies:  Penicillin: Hives  Preventive Screening:  Has patient had any of the following test? Colonscopy  Mammography  Pap Smear   Last Colonoscopy: 2013   Last Mammography: 2015   Last Pap Smear: unknown hysterectomy 2012   [place]: Additional Past Medical and Surgical History: hypertension, GERD, hyperlipidemia.    Family history: CAD, lung cancer.    Social history: Patient denies tobacco or alcohol.   Home Medications: Medication Instructions Last Modified Date/Time  simvastatin 20 mg oral tablet 1 tab(s) orally once a day AM 27-Nov-15 11:21  Nexium 40 mg oral delayed release capsule 1 cap(s) orally once a day AM 27-Nov-15 11:21  Zyrtec 10 mg oral tablet 1 tab(s) orally once a day AM 27-Nov-15 11:21  Vitamin D3 1000 intl units oral tablet 1 tab(s) orally once a day AM 27-Nov-15 11:21  Ocuvite 2 tab(s) orally once a day AM 27-Nov-15 11:21   hydrochlorothiazide-lisinopril 12.5 mg-20 mg oral tablet 1  orally  AM 27-Nov-15 11:21  Vitamin B-12 500 mcg oral tablet 1 tab(s) orally 2 times a day 27-Nov-15 11:21   Vital Signs:  :: adequate blood pressure on pressors. Otherwise vital signs stable.   Physical Exam:  General: well developed, well nourished, and in no acute distress  Mental Status: normal affect  Eyes: anicteric sclera  Head, Ears, Nose,Throat: Normocephalic, moist mucous membranes, clear oropharynx without erythema or thrush.  Neck, Thyroid: No palpable lymphadenopathy, thyroid midline without nodules.  Respiratory: clear to auscultation bilaterally  Cardiovascular: regular rate and rhythm, no murmur, rub, or gallop  Breast: Breast palpated in a circular manner in the sitting and supine positions.  No masses or fullness palpated.  Axilla palpated in both positions with no masses or fullness palpated.  Examination performed in the presence of the nurse  Gastrointestinal: soft, nondistended, nontender, no organomegaly.  normal active bowel sounds  Genitourinary: normal appearance, no lesions or discharge  Musculoskeletal: No edema  Skin: No rash or petechiae noted  Neurological: alert, answering all questions appropriately.  Cranial nerves grossly intact  Lymphatics: no cervical, axillary, or inguinal lymphadenopathy   Laboratory Results: Routine Chem:  28-Nov-15 06:04   Potassium, Serum  3.0  Magnesium, Serum 1.8 (1.8-2.4 THERAPEUTIC RANGE: 4-7 mg/dL TOXIC: > 10 mg/dL  -----------------------)  Glucose, Serum  128  BUN  24  Creatinine (comp)  1.35  Sodium, Serum 138  Chloride, Serum 106  CO2, Serum 22  Calcium (Total), Serum  7.3  Anion Gap 10  Osmolality (calc) 281  eGFR (African American)  49  eGFR (Non-African American)  41 (eGFR values <30m/min/1.73 m2 may be an indication of chronic kidney disease (CKD). Calculated eGFR, using the MRDR Study equation, is useful in  patients with stable renal  function. The eGFR calculation will not be reliable in acutely ill patients when serum creatinine is changing rapidly. It is not useful in patients on dialysis. The eGFR calculation may not be applicable to patients at the low and high extremes of body sizes, pregnant women, and vegetarians.)  Routine Hem:  28-Nov-15 06:04   WBC (CBC)  13.4  RBC (CBC)  3.01  Hemoglobin (CBC)  8.5  Hematocrit (CBC)  26.7  Platelet Count (CBC) 293 (Result(s) reported on 17 Feb 2014 at 06:28AM.)  MCV 89  MCH 28.4  MCHC  31.9  RDW  15.2  Bands 23  Segmented Neutrophils 58  Lymphocytes 9  Monocytes 3  Metamyelocyte 7  Diff Comment 1 ANISOCYTOSIS  Diff Comment 2 HYPOCHROMIA  Diff Comment 3 PLTS VARIED IN SIZE  Result(s) reported on 17 Feb 2014 at 06:28AM.   Assessment and Plan: Impression:   omental mass, concerning for malignancy. Hypotension likely secondary to sepsis. Plan:   1. Omental mass: CT-guided biopsy was not performed secondary to underlying hypotension and altered mental status. This is highly concerning for malignancy and will likely need to be rescheduled once patient's acute symptoms resolve.Anemia: Patient's hemoglobin is trending down. Iron stores, B12, folate, and hemolysis labs have been ordered and are currently pending. She does not require transfusion at this time.Hypotension: Likely secondary to underlying sepsis, continue pressors as indicated.Sepsis: Agree with current antibiotics. consult, will follow.  Advance Directive:  [place]   Do you want to revise or change your advance directive? No   Electronic Signatures: FDelight Hoh(MD)  (Signed [id number])  Authored: Note Type, CC/HPI, Review of Systems, ALLERGIES, Preventive Screening, Patient Family Social History, HOME MEDICATIONS, Vital Signs, Physical Exam, Lab Results Review, Assessment and Plan, Advance Directive   Last Updated: 28-Nov-15 15:15 by FDelight Hoh (MD)

## 2014-07-14 NOTE — Discharge Summary (Signed)
PATIENT NAME:  Courtney [name], Courtney [name] MR#:  [id number] DATE OF BIRTH:  02/13/1941  DATE OF ADMISSION:  02/16/2014 DATE OF DISCHARGE: 02/22/2014  DISCHARGE DIAGNOSES: 1. Septic shock.  2. Acute gastrointestinal bleed.  3. 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  4. Severe anemia.  5. Significant hypokalemia/hypocalcemia/hypomagnesemia.   DISCHARGE MEDICATIONS:  1. Simvastatin 20 mg at bedtime. 2. Nexium 40 mg daily.  3. Vitamin D3 1000 units daily. 4. Vitamin B12 500 mg b.i.d. 5. Xanax 0.25 mg b.i.d. p.r.n. anxiety.  6. Levaquin 500 mg daily x 10 days.  7. Norco 5/325 t.i.d. p.r.n. pain.  8. Potassium chloride 20 mEq t.i.d.   REASON FOR ADMISSION: A 74 year old female who presents with obtundation and hypotension with probable septic shock. Please see history and physical for HPI, past medical history and physical examination.   HOSPITAL COURSE: The patient was admitted and intubated. She had significant hypotension. She was extubated and came off pressors. She was on broad-spectrum antibiotics. Her blood pressure improved. She was transfused with 2 units of PRBCs and discharge hemoglobin of 11.5. Her potassium continued to drop despite p.o. therapy and she received IV therapy for a low potassium, low magnesium and low calcium. She underwent a biopsy of the mass. Those results are pending. There is some question about whether she had bowel perforation; however, only 1 x-ray showed that and she is eating well. Her bowels are moving. Despite her electrolyte abnormalities, the patient really desires to go home in a strong way, and thus we will replace her potassium, magnesium, calcium, and then let her go home as long as she can tolerate p.o.   Overall prognosis is poor.    ____________________________ Rusty Aus, MD mfm:JT D: 02/22/2014 08:15:22 ET T: 02/22/2014 12:49:47 ET JOB#: [id number]  cc: Rusty Aus, MD, <Dictator> Keondra Haydu Roselee Culver MD ELECTRONICALLY SIGNED 02/23/2014 8:15

## 2014-07-14 NOTE — Consult Note (Signed)
Note Type Consult   Subjective: Chief Complaint/Diagnosis:   omental mass, concerning for malignancy. Hypotension likely secondary to sepsis. HPI:   Patient more lethargic today, but alert. Still mildly confused. Her only complaint is of difficulty sleeping and mild abdominal pain.   Review of Systems:  Performance Status (ECOG): 2  Review of Systems:   As per HPI. Otherwise, 10 point system review was negative.   Allergies:  Penicillin: Hives  Preventive Screening:  Has patient had any of the following test? Colonscopy  Mammography  Pap Smear   Last Colonoscopy: 2013   Last Mammography: 2015   Last Pap Smear: unknown hysterectomy 2012   Rayne: Additional Past Medical and Surgical History: hypertension, GERD, hyperlipidemia.    Family history: CAD, lung cancer.    Social history: Patient denies tobacco or alcohol.   Home Medications: Medication Instructions Last Modified Date/Time  simvastatin 20 mg oral tablet 1 tab(s) orally once a day AM 27-Nov-15 11:21  Nexium 40 mg oral delayed release capsule 1 cap(s) orally once a day AM 27-Nov-15 11:21  Zyrtec 10 mg oral tablet 1 tab(s) orally once a day AM 27-Nov-15 11:21  Vitamin D3 1000 intl units oral tablet 1 tab(s) orally once a day AM 27-Nov-15 11:21  Ocuvite 2 tab(s) orally once a day AM 27-Nov-15 11:21  hydrochlorothiazide-lisinopril 12.5 mg-20 mg oral tablet 1  orally  AM 27-Nov-15 11:21  Vitamin B-12 500 mcg oral tablet 1 tab(s) orally 2 times a day 27-Nov-15 11:21   Physical Exam:  General: no acute distress  Mental Status: normal affect  Eyes: anicteric sclera  Respiratory: clear to auscultation bilaterally  Cardiovascular: regular rate and rhythm, no murmur, rub, or gallop  Gastrointestinal: mildly tender, normoactive bowel sounds.  Genitourinary: normal appearance, no lesions or discharge  Musculoskeletal: No edema  Skin: No rash or petechiae noted  Neurological: alert, answering all questions  appropriately.  Cranial nerves grossly intact   Laboratory Results: Routine Chem:  29-Nov-15 04:23   Glucose, Serum  126  BUN  25  Creatinine (comp) 0.98  Sodium, Serum 136  Potassium, Serum 3.9  Chloride, Serum 106  CO2, Serum 22  Calcium (Total), Serum  7.1  Anion Gap 8  Osmolality (calc) 278  eGFR (African American) >60  eGFR (Non-African American)  59 (eGFR values <43m/min/1.73 m2 may be an indication of chronic kidney disease (CKD). Calculated eGFR, using the MRDR Study equation, is useful in  patients with stable renal function. The eGFR calculation will not be reliable in acutely ill patients when serum creatinine is changing rapidly. It is not useful in patients on dialysis. The eGFR calculation may not be applicable to patients at the low and high extremes of body sizes, pregnant women, and vegetarians.)  Result Comment LABS - This specimen was collected through an   - indwelling catheter or arterial line.  - A minimum of 534m of blood was wasted prior    - to collecting the sample.  Interpret  - results with caution.  Result(s) reported on 18 Feb 2014 at 04:42AM.  Routine Hem:  29-Nov-15 04:23   WBC (CBC)  15.4  RBC (CBC)  2.80  Hemoglobin (CBC)  8.1  Hematocrit (CBC)  25.1  Platelet Count (CBC) 226  MCV 90  MCH 28.9  MCHC 32.2  RDW  15.7  Neutrophil % 89.3  Lymphocyte % 6.2  Monocyte % 3.2  Eosinophil % 1.1  Basophil % 0.2  Neutrophil #  13.8  Lymphocyte # 1.0  Monocyte #  0.5  Eosinophil # 0.2  Basophil # 0.0   Orders: Chemotherapy Orders:   1 Ferumoxytol injection                    Order date: null  510 mg, IV Piggyback, once, 468 ml/hr, 15 minute(s)  Assessment and Plan: Impression:   omental mass, concerning for malignancy. Hypotension likely secondary to sepsis, iron deficiency anemia. Plan:   1. Omental mass: CT-guided biopsy was not performed secondary to underlying hypotension and altered mental status. This is highly concerning for  malignancy and will likely need to be rescheduled once patient's acute symptoms resolve.Anemia: Patient's hemoglobin is trending down. Iron stores are also decreased and patient will receive 510 mg IV Feraheme today. She will also benefit from a second infusion in the next several weeks. Patient also noted to have heme positive stools and GI is following. She does not require transfusion at this time.Hypotension: Likely secondary to underlying sepsis, continue pressors as indicated.Sepsis: Agree with current antibiotics. follow.  Advance Directive:  Advance Directive ([place]) yes   Do you want to revise or change your advance directive? No   Electronic Signatures: Delight Hoh (MD)  (Signed [phone] 09:03)  Authored: Note Type, CC/HPI, Review of Systems, ALLERGIES, Preventive Screening, Patient Family Social History, HOME MEDICATIONS, Physical Exam, Lab Results Review, Chemo Orders, Assessment and Plan, Advance Directive   Last Updated: 29-Nov-15 09:03 by Delight Hoh (MD)

## 2014-07-16 LAB — SURGICAL PATHOLOGY

## 2014-07-18 NOTE — Consult Note (Signed)
PATIENT NAME:  Courtney [name], POLO MR#:  [id number] DATE OF BIRTH:  1940-11-11  DATE OF CONSULTATION:  02/17/2014  REFERRING PHYSICIAN:  Tracie Harrier, MD  CONSULTING PHYSICIAN:  Cheral Marker. Ola Spurr, MD  REASON FOR CONSULTATION: Sepsis.   HISTORY OF PRESENT ILLNESS: A 74 year old female, history of hypertension as well as recently diagnosed mesenteric mass, who was about to undergo IR biopsy of the mass when she was noted to be hypotensive and septic-appearing. She was brought to the Emergency Room and admitted and has been started on broad-spectrum antibiotics. She also had altered mental status and fever, although she is currently much more alert.   PAST MEDICAL HISTORY: Hypertension, GERD, hyperlipidemia, and recently diagnosed intra-abdominal mass.   SOCIAL HISTORY: Lives with husband. No tobacco, alcohol, or drugs.   FAMILY HISTORY: Positive for diabetes and heart disease.   ALLERGIES: PENICILLIN, WHICH CAUSES HIVES.  REVIEW OF SYSTEMS: Eleven systems reviewed and negative except as per HPI.  ANTIBIOTICS SINCE ADMISSION: Include Zosyn. She also received vancomycin.   PHYSICAL EXAMINATION:  VITAL SIGNS: Temperature 99, pulse 123, blood pressure 91/44 on vasopressors, respirations 22, saturating 91% on room air, maximum temperature since admission is 101.4.  GENERAL: She is alert and oriented but appears moderately ill. She is talking.  HEENT: Pupils equal, round, and reactive to light and accommodation. Extraocular movements are intact. Sclerae are anicteric.  OROPHARYNX: Clear. No thrush. No lesions.  NECK: Supple.  HEART: Quite tachycardic.  LUNGS: Clear.  ABDOMEN: Soft, somewhat distended, somewhat tender, especially over her area of her mass, which is palpable.  EXTREMITIES: No clubbing, cyanosis, or edema.  NEUROLOGIC: She is alert and oriented x 3. Grossly nonfocal neurologic exam.   DATA: CT of the abdomen done November 27 compared with November 9 showed some dependent  atelectasis in the lower chest and a moderate hiatal hernia. There is mild intrahepatic biliary ductal dilatation, likely within normal limits. There is a small-bowel mesenteric mass, 7.8 x 10.9 cm, which is stable in size with mild haziness in the surrounding fat. There are adjacent lymph nodes as well as a peritoneal nodule noted. There are mixed lytic and sclerotic lesions of the medial right iliac wing. CT of the head showed minimal cerebral atrophy, mild patchy subcortical white matter disease. White blood count on admission was 14.4, currently 16.1; hemoglobin 8.8; platelets 273,000. Differential shows 14.1 thousand neutrophils. Blood cultures 2/2 are no growth to date. C. difficile negative x 2. Urine culture negative. Urinalysis less than 1 white cell. Fecal occult blood test was positive. Lactic acidosis was 1.6. Renal function shows BUN and creatinine 24/1.14. LDH elevated at 417. LFTs were normal except mild elevation of the AST at 49. Troponins were negative.   IMPRESSION: A 74 year old female with a known mesenteric mass admitted just prior to her biopsy with hypotension, sepsis, leukocytosis, and fever. Blood cultures are negative. She has been on broad-spectrum antibiotics with Zosyn and vancomycin.   I suspect she has an abdominal source of sepsis. There does not seem to be any perforation noted on her recent CT scan. There is no free air noted on the KUB.   RECOMMENDATIONS:  1.  Continue broad-spectrum antibiotics pending cultures. I have added ciprofloxacin to the Zosyn in case there is gram-negative sepsis to cover her until cultures.  2.  Continue aggressive resuscitation and vasopressors.  Thank you for the consult. I will be glad to follow with you.    ____________________________ Cheral Marker. Ola Spurr, MD dpf:ST D: 02/17/2014 23:20:06 ET T:  02/18/2014 00:13:56 ET JOB#: [id number]  cc: Cheral Marker. Ola Spurr, MD, <Dictator> Nafis Farnan Ola Spurr MD ELECTRONICALLY SIGNED 03/25/2014 21:19

## 2014-09-06 ENCOUNTER — Other Ambulatory Visit: Payer: Self-pay | Admitting: *Deleted

## 2014-09-06 DIAGNOSIS — C49A0 Gastrointestinal stromal tumor, unspecified site: Secondary | ICD-10-CM

## 2014-09-10 ENCOUNTER — Inpatient Hospital Stay: Payer: Medicare PPO | Attending: Oncology

## 2014-09-10 ENCOUNTER — Encounter: Payer: Self-pay | Admitting: Oncology

## 2014-09-10 ENCOUNTER — Inpatient Hospital Stay (HOSPITAL_BASED_OUTPATIENT_CLINIC_OR_DEPARTMENT_OTHER): Payer: Medicare PPO | Admitting: Oncology

## 2014-09-10 VITALS — BP 149/82 | HR 91 | Temp 95.7°F | Wt 111.1 lb

## 2014-09-10 DIAGNOSIS — Z79899 Other long term (current) drug therapy: Secondary | ICD-10-CM | POA: Diagnosis not present

## 2014-09-10 DIAGNOSIS — C169 Malignant neoplasm of stomach, unspecified: Secondary | ICD-10-CM | POA: Insufficient documentation

## 2014-09-10 DIAGNOSIS — C49A0 Gastrointestinal stromal tumor, unspecified site: Secondary | ICD-10-CM

## 2014-09-10 DIAGNOSIS — Z9889 Other specified postprocedural states: Secondary | ICD-10-CM | POA: Insufficient documentation

## 2014-09-10 LAB — CBC WITH DIFFERENTIAL/PLATELET
BASOS PCT: 1 %
Basophils Absolute: 0.1 10*3/uL (ref 0–0.1)
EOS ABS: 0.2 10*3/uL (ref 0–0.7)
Eosinophils Relative: 3 %
HCT: 32.6 % — ABNORMAL LOW (ref 35.0–47.0)
Hemoglobin: 10.9 g/dL — ABNORMAL LOW (ref 12.0–16.0)
LYMPHS ABS: 1.4 10*3/uL (ref 1.0–3.6)
Lymphocytes Relative: 24 %
MCH: 30.6 pg (ref 26.0–34.0)
MCHC: 33.6 g/dL (ref 32.0–36.0)
MCV: 91.1 fL (ref 80.0–100.0)
Monocytes Absolute: 0.4 10*3/uL (ref 0.2–0.9)
Monocytes Relative: 7 %
NEUTROS PCT: 65 %
Neutro Abs: 3.9 10*3/uL (ref 1.4–6.5)
Platelets: 461 10*3/uL — ABNORMAL HIGH (ref 150–440)
RBC: 3.58 MIL/uL — ABNORMAL LOW (ref 3.80–5.20)
RDW: 13.3 % (ref 11.5–14.5)
WBC: 5.9 10*3/uL (ref 3.6–11.0)

## 2014-09-10 LAB — COMPREHENSIVE METABOLIC PANEL WITH GFR
ALT: 24 U/L (ref 14–54)
AST: 15 U/L (ref 15–41)
Albumin: 3.4 g/dL — ABNORMAL LOW (ref 3.5–5.0)
Alkaline Phosphatase: 123 U/L (ref 38–126)
Anion gap: 5 (ref 5–15)
BUN: 16 mg/dL (ref 6–20)
CO2: 31 mmol/L (ref 22–32)
Calcium: 8.7 mg/dL — ABNORMAL LOW (ref 8.9–10.3)
Chloride: 103 mmol/L (ref 101–111)
Creatinine, Ser: 0.55 mg/dL (ref 0.44–1.00)
GFR calc Af Amer: >60 mL/min
GFR calc non Af Amer: >60 mL/min
Glucose, Bld: 85 mg/dL (ref 65–99)
Potassium: 3.2 mmol/L — ABNORMAL LOW (ref 3.5–5.1)
Sodium: 139 mmol/L (ref 135–145)
Total Bilirubin: 0.4 mg/dL (ref 0.3–1.2)
Total Protein: 6.4 g/dL — ABNORMAL LOW (ref 6.5–8.1)

## 2014-09-10 NOTE — Progress Notes (Signed)
Patient does have living will. Never smoked. 

## 2014-09-15 ENCOUNTER — Encounter: Payer: Self-pay | Admitting: Oncology

## 2014-09-15 NOTE — Progress Notes (Signed)
[place] @ [facility] [phone]  Fax:([phone] [place]: January 31, 1941  MR#: [id number]  DDU#:[id number]  Patient Care Team: Rusty Aus, MD as PCP - General (Internal Medicine)  CHIEF COMPLAINT:  Chief Complaint  Patient presents with  . Follow-up     No history exists.    No flowsheet data found.  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  Dissection was done at [facility] this was followed by multiple complication patient is gradually recovering from that.  Nausea and vomiting is improved.  Recently had a CT scan of abdomen at [facility] here for further follow-up and consideration of treatment with [place].  He had some poor tolerance with nausea in the past from [facility].   Marland KitchenREVIEW OF SYSTEM Gen. status patient is feeling somewhat stronger.  Appetite is improved.  No abdominal pain. HEENT no soreness in the mouth. GI no nausea.  No vomiting.  No diarrhea. Cardiovascular system: No chest pain.  No palpitation.  No paroxysmal nocturnal dyspnea. Neurological system: No dizziness.  No tingling.  His was.  no tingling numbness.  no focal weakness or any focal signs. Skin: No evidence of ecchymosis or rash. Lower extremity no edema As per HPI. Otherwise, a complete review of systems is negatve.  PAST MEDICAL HISTORY: No past medical history on file.  PAST SURGICAL HISTORY: No past surgical history on file.  FAMILY HISTORY No family history on file.  ADVANCED DIRECTIVES:  She does have a living will  HEALTH MAINTENANCE: History  Substance Use Topics  . Smoking status: Never Smoker   . Smokeless tobacco: Not on file  . Alcohol Use: Not on file      No Known Allergies  Current Outpatient Prescriptions  Medication Sig Dispense Refill  . acetaminophen (TYLENOL) 325 MG tablet Take 650 mg by mouth.    . imatinib (GLEEVEC) 400 MG tablet Take 400 mg by mouth at  bedtime. Take with meals and large glass of water.Caution:Chemotherapy.    . Multiple Vitamin (MULTIVITAMIN) tablet Take 1 tablet by mouth daily.    Marland Kitchen oxyCODONE (OXY IR/ROXICODONE) 5 MG immediate release tablet Take 5-10 mg by mouth.    . pantoprazole (PROTONIX) 40 MG tablet TAKE 1 TABLET BY MOUTH EACH DAY    . docusate sodium (COLACE) 100 MG capsule Take by mouth.    . mometasone (NASONEX) 50 MCG/ACT nasal spray 2 sprays by Each Nare route daily.    . ondansetron (ZOFRAN) 4 MG tablet      No current facility-administered medications for this visit.    OBJECTIVE:  Filed Vitals:   09/10/14 1214  BP: 149/82  Pulse: 91  Temp: 95.7 F (35.4 C)     There is no height on file to calculate BMI.    ECOG FS:1 - Symptomatic but completely ambulatory  PHYSICAL EXAM: Gen. status: Patient is alert and oriented not in acute distress Head exam was generally normal. There was no scleral icterus or corneal arcus. Mucous membranes were moist. Examination of the chest was unremarkable. There were no bony deformities, no asymmetry, and no other abnormalities. Cardiac exam revealed the PMI to be normally situated and sized. The rhythm was regular and no extrasystoles were noted during several minutes of auscultation. The first and second heart sounds were normal and physiologic splitting of the second heart sound was noted. There were no murmurs, rubs, clicks, or gallops. Abdomen:  Soft.  Liver and spleen not palpable.  No ascites. Lower extremity no edema. Examination of the skin revealed no evidence of significant rashes, suspicious appearing nevi or other concerning lesions. Neurologically, the patient was awake, alert, and oriented to person, place and time. There were no obvious focal neurologic abnormalities.   LAB RESULTS:  Appointment on 09/10/2014  Component Date Value Ref Range Status  . WBC 09/10/2014 5.9  3.6 - 11.0 K/uL Final  . RBC 09/10/2014 3.58* 3.80 - 5.20 MIL/uL Final  . Hemoglobin  09/10/2014 10.9* 12.0 - 16.0 g/dL Final  . HCT 09/10/2014 32.6* 35.0 - 47.0 % Final  . MCV 09/10/2014 91.1  80.0 - 100.0 fL Final  . MCH 09/10/2014 30.6  26.0 - 34.0 pg Final  . MCHC 09/10/2014 33.6  32.0 - 36.0 g/dL Final  . RDW 09/10/2014 13.3  11.5 - 14.5 % Final  . Platelets 09/10/2014 461* 150 - 440 K/uL Final  . Neutrophils Relative % 09/10/2014 65   Final  . Neutro Abs 09/10/2014 3.9  1.4 - 6.5 K/uL Final  . Lymphocytes Relative 09/10/2014 24   Final  . Lymphs Abs 09/10/2014 1.4  1.0 - 3.6 K/uL Final  . Monocytes Relative 09/10/2014 7   Final  . Monocytes Absolute 09/10/2014 0.4  0.2 - 0.9 K/uL Final  . Eosinophils Relative 09/10/2014 3   Final  . Eosinophils Absolute 09/10/2014 0.2  0 - 0.7 K/uL Final  . Basophils Relative 09/10/2014 1   Final  . Basophils Absolute 09/10/2014 0.1  0 - 0.1 K/uL Final  . Sodium 09/10/2014 139  135 - 145 mmol/L Final  . Potassium 09/10/2014 3.2* 3.5 - 5.1 mmol/L Final  . Chloride 09/10/2014 103  101 - 111 mmol/L Final  . CO2 09/10/2014 31  22 - 32 mmol/L Final  . Glucose, Bld 09/10/2014 85  65 - 99 mg/dL Final  . BUN 09/10/2014 16  6 - 20 mg/dL Final  . Creatinine, Ser 09/10/2014 0.55  0.44 - 1.00 mg/dL Final  . Calcium 09/10/2014 8.7* 8.9 - 10.3 mg/dL Final  . Total Protein 09/10/2014 6.4* 6.5 - 8.1 g/dL Final  . Albumin 09/10/2014 3.4* 3.5 - 5.0 g/dL Final  . AST 09/10/2014 15  15 - 41 U/L Final  . ALT 09/10/2014 24  14 - 54 U/L Final  . Alkaline Phosphatase 09/10/2014 123  38 - 126 U/L Final  . Total Bilirubin 09/10/2014 0.4  0.3 - 1.2 mg/dL Final  . GFR calc non Af Amer 09/10/2014 >60  >60 mL/min Final  . GFR calc Af Amer 09/10/2014 >60  >60 mL/min Final   Comment: (NOTE) The eGFR has been calculated using the CKD EPI equation. This calculation has not been validated in all clinical situations. eGFR's persistently <60 mL/min signify possible Chronic Kidney Disease.   . Anion gap 09/10/2014 5  5 - 15 Final      STUDIES: No results  found.  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 Patient does have anemia which will be followed carefully I had a long discussion with patient regarding starting tGLEEVAC .  Considering patient had a previous nausea was decided that patient's who take Zofran and hour before the [facility] continues to start you at the nighttime.  Gleevec 400 mg by mouth daily will be started.   Patient expressed understanding and was in agreement with this plan. She also  understands that She can call clinic at any time with any questions, concerns, or complaints.    No matching staging information was found for the patient.  Forest Gleason, MD   09/15/2014 7:05 AM

## 2014-09-25 ENCOUNTER — Other Ambulatory Visit: Payer: Self-pay | Admitting: *Deleted

## 2014-09-25 NOTE — Telephone Encounter (Signed)
Patient states the pills she has at home are only 100 mg pills and not the 400 mg she thought she had. States she vomited 1 hour after taking 200 mg Friday and she is only taking 200 mg right now until you order her 400 mg pills. Asking for a call back to let her know how you want her to take the 100 mg pills since she has a limited qty

## 2014-09-25 NOTE — Telephone Encounter (Signed)
Pt instructed to take 2 tabs of 100 mg at bedtime. She reports that she has 56 more tabs so I told her she does not need a refill at this time, she has a fu appt in 7/18

## 2014-10-05 ENCOUNTER — Other Ambulatory Visit: Payer: Self-pay | Admitting: *Deleted

## 2014-10-05 DIAGNOSIS — C49A0 Gastrointestinal stromal tumor, unspecified site: Secondary | ICD-10-CM

## 2014-10-08 ENCOUNTER — Inpatient Hospital Stay: Payer: Medicare PPO | Attending: Oncology

## 2014-10-08 ENCOUNTER — Telehealth: Payer: Self-pay | Admitting: *Deleted

## 2014-10-08 ENCOUNTER — Inpatient Hospital Stay (HOSPITAL_BASED_OUTPATIENT_CLINIC_OR_DEPARTMENT_OTHER): Payer: Medicare PPO | Admitting: Oncology

## 2014-10-08 VITALS — BP 153/57 | HR 79 | Temp 95.6°F | Wt 120.8 lb

## 2014-10-08 DIAGNOSIS — M1990 Unspecified osteoarthritis, unspecified site: Secondary | ICD-10-CM | POA: Insufficient documentation

## 2014-10-08 DIAGNOSIS — R112 Nausea with vomiting, unspecified: Secondary | ICD-10-CM | POA: Diagnosis not present

## 2014-10-08 DIAGNOSIS — H05229 Edema of unspecified orbit: Secondary | ICD-10-CM | POA: Diagnosis not present

## 2014-10-08 DIAGNOSIS — Z79899 Other long term (current) drug therapy: Secondary | ICD-10-CM | POA: Insufficient documentation

## 2014-10-08 DIAGNOSIS — C169 Malignant neoplasm of stomach, unspecified: Secondary | ICD-10-CM | POA: Insufficient documentation

## 2014-10-08 DIAGNOSIS — C49A0 Gastrointestinal stromal tumor, unspecified site: Secondary | ICD-10-CM

## 2014-10-08 DIAGNOSIS — K5790 Diverticulosis of intestine, part unspecified, without perforation or abscess without bleeding: Secondary | ICD-10-CM | POA: Diagnosis not present

## 2014-10-08 LAB — CBC WITH DIFFERENTIAL/PLATELET
Basophils Absolute: 0 10*3/uL (ref 0–0.1)
Basophils Relative: 1 %
EOS PCT: 3 %
Eosinophils Absolute: 0.1 10*3/uL (ref 0–0.7)
HCT: 32.3 % — ABNORMAL LOW (ref 35.0–47.0)
HEMOGLOBIN: 10.7 g/dL — AB (ref 12.0–16.0)
Lymphocytes Relative: 27 %
Lymphs Abs: 1.1 10*3/uL (ref 1.0–3.6)
MCH: 30.5 pg (ref 26.0–34.0)
MCHC: 33.2 g/dL (ref 32.0–36.0)
MCV: 91.9 fL (ref 80.0–100.0)
MONO ABS: 0.3 10*3/uL (ref 0.2–0.9)
Monocytes Relative: 7 %
NEUTROS ABS: 2.6 10*3/uL (ref 1.4–6.5)
NEUTROS PCT: 62 %
Platelets: 219 10*3/uL (ref 150–440)
RBC: 3.51 MIL/uL — AB (ref 3.80–5.20)
RDW: 14.9 % — AB (ref 11.5–14.5)
WBC: 4.2 10*3/uL (ref 3.6–11.0)

## 2014-10-08 LAB — COMPREHENSIVE METABOLIC PANEL
ALBUMIN: 3.4 g/dL — AB (ref 3.5–5.0)
ALK PHOS: 54 U/L (ref 38–126)
ALT: 10 U/L — ABNORMAL LOW (ref 14–54)
AST: 17 U/L (ref 15–41)
Anion gap: 5 (ref 5–15)
BUN: 12 mg/dL (ref 6–20)
CHLORIDE: 104 mmol/L (ref 101–111)
CO2: 31 mmol/L (ref 22–32)
Calcium: 7.8 mg/dL — ABNORMAL LOW (ref 8.9–10.3)
Creatinine, Ser: 0.66 mg/dL (ref 0.44–1.00)
GFR calc non Af Amer: >60 mL/min (ref >60)
Glucose, Bld: 92 mg/dL (ref 65–99)
POTASSIUM: 3 mmol/L — AB (ref 3.5–5.1)
Sodium: 140 mmol/L (ref 135–145)
Total Bilirubin: 0.5 mg/dL (ref 0.3–1.2)
Total Protein: 5.9 g/dL — ABNORMAL LOW (ref 6.5–8.1)

## 2014-10-08 LAB — MAGNESIUM: Magnesium: 1.1 mg/dL — ABNORMAL LOW (ref 1.7–2.4)

## 2014-10-08 MED ORDER — HYDROCORTISONE ACETATE 25 MG RE SUPP: 25.0000 mg | Freq: Every day | RECTAL | Status: DC | PRN

## 2014-10-08 MED ORDER — IMATINIB MESYLATE 100 MG PO TABS: 300.0000 mg | ORAL_TABLET | Freq: Every day | ORAL | Status: DC

## 2014-10-08 NOTE — Telephone Encounter (Signed)
Per Dr Oliva Bustard she can use OTC suppository. Pt informed of this and she thanked me for calling

## 2014-10-08 NOTE — Progress Notes (Signed)
Patient does have living will.  Never smoker.  Patient complains of having burning in her rectum with each BM.  Wants to know if she can take a probiotic.

## 2014-10-10 ENCOUNTER — Inpatient Hospital Stay: Payer: Medicare PPO

## 2014-10-10 VITALS — BP 150/59 | HR 76 | Temp 96.1°F | Resp 18

## 2014-10-10 DIAGNOSIS — C169 Malignant neoplasm of stomach, unspecified: Secondary | ICD-10-CM | POA: Diagnosis not present

## 2014-10-10 DIAGNOSIS — C49A0 Gastrointestinal stromal tumor, unspecified site: Secondary | ICD-10-CM

## 2014-10-10 MED ORDER — SODIUM CHLORIDE 0.9 % IV SOLN
Freq: Once | INTRAVENOUS | Status: AC
  Administered 2014-10-10: 12:00:00 via INTRAVENOUS
  Filled 2014-10-10: qty 500

## 2014-10-12 ENCOUNTER — Telehealth: Payer: Self-pay | Admitting: *Deleted

## 2014-10-12 DIAGNOSIS — C49A0 Gastrointestinal stromal tumor, unspecified site: Secondary | ICD-10-CM

## 2014-10-12 MED ORDER — IMATINIB MESYLATE 100 MG PO TABS: 300.0000 mg | ORAL_TABLET | Freq: Every day | ORAL | Status: DC

## 2014-10-12 NOTE — Addendum Note (Signed)
Addended by: Telford Nab on: 10/12/2014 03:36 PM   Modules accepted: Orders

## 2014-10-12 NOTE — Telephone Encounter (Signed)
Patient states she is getting calls from [facility].  She does not want anything from them and is requesting we call them and cancel all medications coming from them.

## 2014-10-12 NOTE — Telephone Encounter (Signed)
Spoke with patient who wishes that prescription be sent to [facility] instead of [facility]. Informed pt will send prescription for gleevec to [facility] and cancel the rx that is at [place]. Pt verbalized understanding and thanked me for calling her back.

## 2014-10-12 NOTE — Telephone Encounter (Signed)
Called patient back and left message instructing her to call [facility] back because this is where she gets her Gleevac from.  MD has put her back on the drug at a lower dosage

## 2014-10-14 ENCOUNTER — Encounter: Payer: Self-pay | Admitting: Oncology

## 2014-10-14 NOTE — Progress Notes (Signed)
[place] @ [facility] Telephone:(336) [phone]  Fax:(336) [place] OB: April 02, 1940  MR#: [id number]  XJO#:[id number]  Patient Care Team: Rusty Aus, MD as PCP - General (Internal Medicine)  CHIEF COMPLAINT:  Chief Complaint  Patient presents with  . Follow-up     No history exists.    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  Patient has been started on [place] therapy from June of 2016 INTERVAL HISTORY:  74 year old lady with a history of gastrointestinal stromal tumor patient started back on Gleevec therapy could tolerate only 200 mg.  Taking a nausea pill at night followed by [place] therapy.  When she tried to take 300 patient became sick.  Has developed periorbital edema. Gradually gaining weight appetite is improving.  Review of systems: Gen. status patient is feeling somewhat stronger.  Appetite is improved.  No abdominal pain. HEENT no soreness in the mouth. GI no nausea.  No vomiting.  No diarrhea. Cardiovascular system: No chest pain.  No palpitation.  No paroxysmal nocturnal dyspnea. Neurological system: No dizziness.  No tingling.  His was.  no tingling numbness.  no focal weakness or any focal signs. Skin: No evidence of ecchymosis or rash. Lower extremity no edema As per HPI. Otherwise, a complete review of systems is negatve.  PAST MEDICAL HISTORY: Diverticulitis. Degenerative arthritis disease Gastroesophageal reflux disease  PAST SURGICAL HISTORY: Recent surgery for gastrointestinal stromal tumor followed by diverting colostomy because of complications now colostomy had been reversed. FAMILY HISTORY There is no significant family history of breast cancer, ovarian cancer, colon cancer ADVANCED DIRECTIVES:  She does have a living will  HEALTH MAINTENANCE: History  Substance Use Topics  . Smoking status: Never Smoker   . Smokeless tobacco: Not on  file  . Alcohol Use: Not on file      No Known Allergies  Current Outpatient Prescriptions  Medication Sig Dispense Refill  . acetaminophen (TYLENOL) 325 MG tablet Take 650 mg by mouth.    . mometasone (NASONEX) 50 MCG/ACT nasal spray 2 sprays by Each Nare route daily.    . Multiple Vitamin (MULTIVITAMIN) tablet Take 1 tablet by mouth daily.    . ondansetron (ZOFRAN) 4 MG tablet     . pantoprazole (PROTONIX) 40 MG tablet TAKE 1 TABLET BY MOUTH EACH DAY    . vitamin B-12 (CYANOCOBALAMIN) 50 MCG tablet Take 50 mcg by mouth daily.    Marland Kitchen docusate sodium (COLACE) 100 MG capsule Take by mouth.    . hydrocortisone (ANUSOL-HC) 25 MG suppository Place 1 suppository (25 mg total) rectally daily as needed for hemorrhoids or itching. 12 suppository 3  . imatinib (GLEEVEC) 100 MG tablet Take 3 tablets (300 mg total) by mouth at bedtime. Take with meals and large glass of water.Caution:Chemotherapy 90 tablet 3  . oxyCODONE (OXY IR/ROXICODONE) 5 MG immediate release tablet Take 5-10 mg by mouth.     No current facility-administered medications for this visit.    OBJECTIVE:  Filed Vitals:   10/08/14 1120  BP: 153/57  Pulse: 79  Temp: 95.6 F (35.3 C)     There is no height on file to calculate BMI.    ECOG FS:1 - Symptomatic but completely ambulatory  PHYSICAL EXAM: Gen. status: Patient is alert and oriented not in acute distress Head exam was generally normal. There was no scleral icterus or corneal arcus. Mucous membranes were moist. Periorbital edema Examination of the chest  was unremarkable. There were no bony deformities, no asymmetry, and no other abnormalities. Cardiac exam revealed the PMI to be normally situated and sized. The rhythm was regular and no extrasystoles were noted during several minutes of auscultation. The first and second heart sounds were normal and physiologic splitting of the second heart sound was noted. There were no murmurs, rubs, clicks, or gallops. Abdomen: Soft.   Liver and spleen not palpable.  No ascites. Lower extremity no edema. Examination of the skin revealed no evidence of significant rashes, suspicious appearing nevi or other concerning lesions. Neurologically, the patient was awake, alert, and oriented to person, place and time. There were no obvious focal neurologic abnormalities.   LAB RESULTS:  Appointment on 10/08/2014  Component Date Value Ref Range Status  . WBC 10/08/2014 4.2  3.6 - 11.0 K/uL Final  . RBC 10/08/2014 3.51* 3.80 - 5.20 MIL/uL Final  . Hemoglobin 10/08/2014 10.7* 12.0 - 16.0 g/dL Final  . HCT 10/08/2014 32.3* 35.0 - 47.0 % Final  . MCV 10/08/2014 91.9  80.0 - 100.0 fL Final  . MCH 10/08/2014 30.5  26.0 - 34.0 pg Final  . MCHC 10/08/2014 33.2  32.0 - 36.0 g/dL Final  . RDW 10/08/2014 14.9* 11.5 - 14.5 % Final  . Platelets 10/08/2014 219  150 - 440 K/uL Final  . Neutrophils Relative % 10/08/2014 62   Final  . Neutro Abs 10/08/2014 2.6  1.4 - 6.5 K/uL Final  . Lymphocytes Relative 10/08/2014 27   Final  . Lymphs Abs 10/08/2014 1.1  1.0 - 3.6 K/uL Final  . Monocytes Relative 10/08/2014 7   Final  . Monocytes Absolute 10/08/2014 0.3  0.2 - 0.9 K/uL Final  . Eosinophils Relative 10/08/2014 3   Final  . Eosinophils Absolute 10/08/2014 0.1  0 - 0.7 K/uL Final  . Basophils Relative 10/08/2014 1   Final  . Basophils Absolute 10/08/2014 0.0  0 - 0.1 K/uL Final  . Sodium 10/08/2014 140  135 - 145 mmol/L Final  . Potassium 10/08/2014 3.0* 3.5 - 5.1 mmol/L Final  . Chloride 10/08/2014 104  101 - 111 mmol/L Final  . CO2 10/08/2014 31  22 - 32 mmol/L Final  . Glucose, Bld 10/08/2014 92  65 - 99 mg/dL Final  . BUN 10/08/2014 12  6 - 20 mg/dL Final  . Creatinine, Ser 10/08/2014 0.66  0.44 - 1.00 mg/dL Final  . Calcium 10/08/2014 7.8* 8.9 - 10.3 mg/dL Final  . Total Protein 10/08/2014 5.9* 6.5 - 8.1 g/dL Final  . Albumin 10/08/2014 3.4* 3.5 - 5.0 g/dL Final  . AST 10/08/2014 17  15 - 41 U/L Final  . ALT 10/08/2014 10* 14 -  54 U/L Final  . Alkaline Phosphatase 10/08/2014 54  38 - 126 U/L Final  . Total Bilirubin 10/08/2014 0.5  0.3 - 1.2 mg/dL Final  . GFR calc non Af Amer 10/08/2014 >60  >60 mL/min Final  . GFR calc Af Amer 10/08/2014 >60  >60 mL/min Final   Comment: (NOTE) The eGFR has been calculated using the CKD EPI equation. This calculation has not been validated in all clinical situations. eGFR's persistently <60 mL/min signify possible Chronic Kidney Disease.   . Anion gap 10/08/2014 5  5 - 15 Final  . Magnesium 10/08/2014 1.1* 1.7 - 2.4 mg/dL Final      STUDIES: No results found.  ASSESSMENT: Gastrointestinal stromal tumor status post resection with multiple postoperative complications Patient is back on Gleevec therapy with side effect of nausea  vomiting if those is exceeded more than 200 mg and periorbital edema.  MEDICAL DECISION MAKING:  All lab data has been reviewed.  Gradually increase   Glevac  to 300 mg. Continued on a train of blood test and side effect.    Patient expressed understanding and was in agreement with this plan. She also understands that She can call clinic at any time with any questions, concerns, or complaints.    No matching staging information was found for the patient.  Forest Gleason, MD   10/14/2014 9:00 AM

## 2014-10-15 ENCOUNTER — Telehealth: Payer: Self-pay | Admitting: *Deleted

## 2014-10-15 NOTE — Telephone Encounter (Signed)
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

## 2014-10-29 ENCOUNTER — Inpatient Hospital Stay: Payer: Medicare PPO

## 2014-10-29 ENCOUNTER — Inpatient Hospital Stay (HOSPITAL_BASED_OUTPATIENT_CLINIC_OR_DEPARTMENT_OTHER): Payer: Medicare PPO | Admitting: Oncology

## 2014-10-29 ENCOUNTER — Inpatient Hospital Stay: Payer: Medicare PPO | Attending: Oncology

## 2014-10-29 ENCOUNTER — Encounter: Payer: Self-pay | Admitting: Oncology

## 2014-10-29 VITALS — BP 137/89 | HR 96 | Temp 95.8°F | Wt 121.9 lb

## 2014-10-29 DIAGNOSIS — R1012 Left upper quadrant pain: Secondary | ICD-10-CM

## 2014-10-29 DIAGNOSIS — C49A0 Gastrointestinal stromal tumor, unspecified site: Secondary | ICD-10-CM

## 2014-10-29 DIAGNOSIS — Z79899 Other long term (current) drug therapy: Secondary | ICD-10-CM

## 2014-10-29 DIAGNOSIS — C169 Malignant neoplasm of stomach, unspecified: Secondary | ICD-10-CM | POA: Diagnosis not present

## 2014-10-29 DIAGNOSIS — R112 Nausea with vomiting, unspecified: Secondary | ICD-10-CM

## 2014-10-29 DIAGNOSIS — H05229 Edema of unspecified orbit: Secondary | ICD-10-CM

## 2014-10-29 LAB — CBC WITH DIFFERENTIAL/PLATELET
Basophils Absolute: 0 10*3/uL (ref 0–0.1)
Basophils Relative: 1 %
EOS ABS: 0.1 10*3/uL (ref 0–0.7)
Eosinophils Relative: 3 %
HEMATOCRIT: 33.8 % — AB (ref 35.0–47.0)
HEMOGLOBIN: 11.4 g/dL — AB (ref 12.0–16.0)
LYMPHS PCT: 29 %
Lymphs Abs: 1.5 10*3/uL (ref 1.0–3.6)
MCH: 30.9 pg (ref 26.0–34.0)
MCHC: 33.6 g/dL (ref 32.0–36.0)
MCV: 92 fL (ref 80.0–100.0)
MONOS PCT: 7 %
Monocytes Absolute: 0.3 10*3/uL (ref 0.2–0.9)
NEUTROS ABS: 3.1 10*3/uL (ref 1.4–6.5)
NEUTROS PCT: 60 %
PLATELETS: 287 10*3/uL (ref 150–440)
RBC: 3.67 MIL/uL — AB (ref 3.80–5.20)
RDW: 15.5 % — AB (ref 11.5–14.5)
WBC: 5 10*3/uL (ref 3.6–11.0)

## 2014-10-29 LAB — COMPREHENSIVE METABOLIC PANEL
ALT: 13 U/L — ABNORMAL LOW (ref 14–54)
AST: 20 U/L (ref 15–41)
Albumin: 3.7 g/dL (ref 3.5–5.0)
Alkaline Phosphatase: 55 U/L (ref 38–126)
Anion gap: 5 (ref 5–15)
BUN: 16 mg/dL (ref 6–20)
CO2: 30 mmol/L (ref 22–32)
Calcium: 8.4 mg/dL — ABNORMAL LOW (ref 8.9–10.3)
Chloride: 103 mmol/L (ref 101–111)
Creatinine, Ser: 0.68 mg/dL (ref 0.44–1.00)
GFR calc Af Amer: >60 mL/min (ref >60)
GLUCOSE: 80 mg/dL (ref 65–99)
Potassium: 3.5 mmol/L (ref 3.5–5.1)
SODIUM: 138 mmol/L (ref 135–145)
TOTAL PROTEIN: 6.1 g/dL — AB (ref 6.5–8.1)
Total Bilirubin: 0.5 mg/dL (ref 0.3–1.2)

## 2014-10-29 LAB — MAGNESIUM: Magnesium: 1.4 mg/dL — ABNORMAL LOW (ref 1.7–2.4)

## 2014-10-29 NOTE — Progress Notes (Signed)
Patient does have living will. Never smoked. 

## 2014-10-29 NOTE — Progress Notes (Signed)
   10/29/14 1010  Clinical Encounter Type  Visited With Patient  Visit Type Initial  Spiritual Encounters  Spiritual Needs Emotional  Provided pastoral support, compassion and presence to patient in the cancer center.  [place] [phone]

## 2014-11-03 NOTE — Progress Notes (Signed)
[place] @ [facility] Telephone:([phone]  Fax:(336) [place]: [birth year]  MR#: [id number]  HAL#:[id number]  Patient Care Team: Rusty Aus, MD as PCP - General (Internal Medicine)  CHIEF COMPLAINT:  Chief Complaint  Patient presents with  . Follow-up     No history exists.    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  Patient has been started on [place] therapy from June of 2016 Presently tolerating only 200 mg daily tablets.  INTERVAL HISTORY:  74 year old lady with a history of gastrointestinal stromal tumor patient started back on Gleevec therapy could tolerate only 200 mg.  Taking a nausea pill at night followed by [place] therapy.  When she tried to take 300 patient became sick.  Has developed periorbital edema. Gradually gaining weight appetite is improving.Patient was advised to increase that to 300 mg Left upper quadrant discomfort.  No rectal bleeding.  No nausea.  No vomiting.  Review of systems: Gen. status patient is feeling somewhat stronger.  Appetite is improved.  No abdominal pain. HEENT no soreness in the mouth. GI no nausea.  No vomiting.  No diarrhea. Cardiovascular system: No chest pain.  No palpitation.  No paroxysmal nocturnal dyspnea. Neurological system: No dizziness.  No tingling.  His was.  no tingling numbness.  no focal weakness or any focal signs. Skin: No evidence of ecchymosis or rash. Lower extremity no edema As per HPI. Otherwise, a complete review of systems is negatve.  PAST MEDICAL HISTORY: Diverticulitis. Degenerative arthritis disease Gastroesophageal reflux disease  PAST SURGICAL HISTORY: Recent surgery for gastrointestinal stromal tumor followed by diverting colostomy because of complications now colostomy had been reversed. FAMILY HISTORY There is no significant family history of breast cancer, ovarian cancer, colon  cancer ADVANCED DIRECTIVES:  She does have a living will  HEALTH MAINTENANCE: Social History  Substance Use Topics  . Smoking status: Never Smoker   . Smokeless tobacco: None  . Alcohol Use: None      No Known Allergies  Current Outpatient Prescriptions  Medication Sig Dispense Refill  . acetaminophen (TYLENOL) 325 MG tablet Take 650 mg by mouth.    . calcium carbonate (OS-CAL) 600 MG TABS tablet Take 600 mg by mouth 2 (two) times daily with a meal.    . docusate sodium (COLACE) 100 MG capsule Take by mouth.    . hydrocortisone (ANUSOL-HC) 25 MG suppository Place 1 suppository (25 mg total) rectally daily as needed for hemorrhoids or itching. 12 suppository 3  . imatinib (GLEEVEC) 100 MG tablet Take 3 tablets (300 mg total) by mouth at bedtime. Take with meals and large glass of water.Caution:Chemotherapy 90 tablet 3  . mometasone (NASONEX) 50 MCG/ACT nasal spray 2 sprays by Each Nare route daily.    . Multiple Vitamin (MULTIVITAMIN) tablet Take 1 tablet by mouth daily.    . ondansetron (ZOFRAN) 4 MG tablet     . oxyCODONE (OXY IR/ROXICODONE) 5 MG immediate release tablet Take 5-10 mg by mouth.    . pantoprazole (PROTONIX) 40 MG tablet TAKE 1 TABLET BY MOUTH EACH DAY    . vitamin B-12 (CYANOCOBALAMIN) 50 MCG tablet Take 50 mcg by mouth daily.    . promethazine (PHENERGAN) 25 MG tablet TAKE ONE (1) TABLET AT BEDTIME NAUSEA  3   No current facility-administered medications for this visit.    OBJECTIVE:  Filed Vitals:   10/29/14 1010  BP: 137/89  Pulse: 96  Temp: 95.8 F (35.4 C)     There is no height on file to calculate BMI.    ECOG FS:1 - Symptomatic but completely ambulatory  PHYSICAL EXAM: Gen. status: Patient is alert and oriented not in acute distress Head exam was generally normal. There was no scleral icterus or corneal arcus. Mucous membranes were moist. Periorbital edema Examination of the chest was unremarkable. There were no bony deformities, no asymmetry,  and no other abnormalities. Cardiac exam revealed the PMI to be normally situated and sized. The rhythm was regular and no extrasystoles were noted during several minutes of auscultation. The first and second heart sounds were normal and physiologic splitting of the second heart sound was noted. There were no murmurs, rubs, clicks, or gallops. Abdomen: Soft.  Liver and spleen not palpable.  No ascites. Lower extremity no edema. Examination of the skin revealed no evidence of significant rashes, suspicious appearing nevi or other concerning lesions. Neurologically, the patient was awake, alert, and oriented to person, place and time. There were no obvious focal neurologic abnormalities.   LAB RESULTS:  Appointment on 10/29/2014  Component Date Value Ref Range Status  . WBC 10/29/2014 5.0  3.6 - 11.0 K/uL Final  . RBC 10/29/2014 3.67* 3.80 - 5.20 MIL/uL Final  . Hemoglobin 10/29/2014 11.4* 12.0 - 16.0 g/dL Final  . HCT 10/29/2014 33.8* 35.0 - 47.0 % Final  . MCV 10/29/2014 92.0  80.0 - 100.0 fL Final  . MCH 10/29/2014 30.9  26.0 - 34.0 pg Final  . MCHC 10/29/2014 33.6  32.0 - 36.0 g/dL Final  . RDW 10/29/2014 15.5* 11.5 - 14.5 % Final  . Platelets 10/29/2014 287  150 - 440 K/uL Final  . Neutrophils Relative % 10/29/2014 60   Final  . Neutro Abs 10/29/2014 3.1  1.4 - 6.5 K/uL Final  . Lymphocytes Relative 10/29/2014 29   Final  . Lymphs Abs 10/29/2014 1.5  1.0 - 3.6 K/uL Final  . Monocytes Relative 10/29/2014 7   Final  . Monocytes Absolute 10/29/2014 0.3  0.2 - 0.9 K/uL Final  . Eosinophils Relative 10/29/2014 3   Final  . Eosinophils Absolute 10/29/2014 0.1  0 - 0.7 K/uL Final  . Basophils Relative 10/29/2014 1   Final  . Basophils Absolute 10/29/2014 0.0  0 - 0.1 K/uL Final  . Sodium 10/29/2014 138  135 - 145 mmol/L Final  . Potassium 10/29/2014 3.5  3.5 - 5.1 mmol/L Final  . Chloride 10/29/2014 103  101 - 111 mmol/L Final  . CO2 10/29/2014 30  22 - 32 mmol/L Final  . Glucose, Bld  10/29/2014 80  65 - 99 mg/dL Final  . BUN 10/29/2014 16  6 - 20 mg/dL Final  . Creatinine, Ser 10/29/2014 0.68  0.44 - 1.00 mg/dL Final  . Calcium 10/29/2014 8.4* 8.9 - 10.3 mg/dL Final  . Total Protein 10/29/2014 6.1* 6.5 - 8.1 g/dL Final  . Albumin 10/29/2014 3.7  3.5 - 5.0 g/dL Final  . AST 10/29/2014 20  15 - 41 U/L Final  . ALT 10/29/2014 13* 14 - 54 U/L Final  . Alkaline Phosphatase 10/29/2014 55  38 - 126 U/L Final  . Total Bilirubin 10/29/2014 0.5  0.3 - 1.2 mg/dL Final  . GFR calc non Af Amer 10/29/2014 >60  >60 mL/min Final  . GFR calc Af Amer 10/29/2014 >60  >60 mL/min Final   Comment: (NOTE) The eGFR has been calculated using the CKD EPI equation. This calculation has not been validated  in all clinical situations. eGFR's persistently <60 mL/min signify possible Chronic Kidney Disease.   . Anion gap 10/29/2014 5  5 - 15 Final  . Magnesium 10/29/2014 1.4* 1.7 - 2.4 mg/dL Final      STUDIES: No results found.  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  MEDICAL DECISION MAKING:  All lab data has been reviewed.  Gradually increase   Glevac  to 300 mg. Continued on a train of blood test and side effect.    Patient expressed understanding and was in agreement with this plan. She also understands that She can call clinic at any time with any questions, concerns, or complaints.    No matching staging information was found for the patient.  Forest Gleason, MD   11/03/2014 3:03 PM

## 2014-11-10 ENCOUNTER — Other Ambulatory Visit: Payer: Self-pay | Admitting: Oncology

## 2014-11-12 ENCOUNTER — Telehealth: Payer: Self-pay | Admitting: *Deleted

## 2014-11-12 NOTE — Telephone Encounter (Signed)
Advised that she has refills on her rx and she just needs to call pharmacy to let them know to reorder her med

## 2014-11-27 ENCOUNTER — Encounter: Payer: Self-pay | Admitting: Oncology

## 2014-11-27 ENCOUNTER — Inpatient Hospital Stay: Payer: Medicare PPO | Attending: Oncology

## 2014-11-27 ENCOUNTER — Inpatient Hospital Stay (HOSPITAL_BASED_OUTPATIENT_CLINIC_OR_DEPARTMENT_OTHER): Payer: Medicare PPO | Admitting: Oncology

## 2014-11-27 VITALS — BP 157/71 | HR 83 | Temp 96.3°F | Wt 128.1 lb

## 2014-11-27 DIAGNOSIS — R112 Nausea with vomiting, unspecified: Secondary | ICD-10-CM | POA: Diagnosis not present

## 2014-11-27 DIAGNOSIS — C49A0 Gastrointestinal stromal tumor, unspecified site: Secondary | ICD-10-CM

## 2014-11-27 DIAGNOSIS — K219 Gastro-esophageal reflux disease without esophagitis: Secondary | ICD-10-CM | POA: Diagnosis not present

## 2014-11-27 DIAGNOSIS — M1990 Unspecified osteoarthritis, unspecified site: Secondary | ICD-10-CM | POA: Insufficient documentation

## 2014-11-27 DIAGNOSIS — H05229 Edema of unspecified orbit: Secondary | ICD-10-CM

## 2014-11-27 DIAGNOSIS — Z79899 Other long term (current) drug therapy: Secondary | ICD-10-CM | POA: Diagnosis not present

## 2014-11-27 DIAGNOSIS — K5790 Diverticulosis of intestine, part unspecified, without perforation or abscess without bleeding: Secondary | ICD-10-CM | POA: Diagnosis not present

## 2014-11-27 DIAGNOSIS — C169 Malignant neoplasm of stomach, unspecified: Secondary | ICD-10-CM

## 2014-11-27 LAB — CBC WITH DIFFERENTIAL/PLATELET
BASOS ABS: 0 10*3/uL (ref 0–0.1)
BASOS PCT: 1 %
EOS PCT: 2 %
Eosinophils Absolute: 0.1 10*3/uL (ref 0–0.7)
HCT: 27.7 % — ABNORMAL LOW (ref 35.0–47.0)
Hemoglobin: 9.8 g/dL — ABNORMAL LOW (ref 12.0–16.0)
Lymphocytes Relative: 26 %
Lymphs Abs: 1 10*3/uL (ref 1.0–3.6)
MCH: 33.3 pg (ref 26.0–34.0)
MCHC: 35.5 g/dL (ref 32.0–36.0)
MCV: 93.8 fL (ref 80.0–100.0)
MONO ABS: 0.2 10*3/uL (ref 0.2–0.9)
Monocytes Relative: 7 %
Neutro Abs: 2.3 10*3/uL (ref 1.4–6.5)
Neutrophils Relative %: 64 %
PLATELETS: 209 10*3/uL (ref 150–440)
RBC: 2.95 MIL/uL — AB (ref 3.80–5.20)
RDW: 16.2 % — AB (ref 11.5–14.5)
WBC: 3.7 10*3/uL (ref 3.6–11.0)

## 2014-11-27 LAB — COMPREHENSIVE METABOLIC PANEL
ALT: 11 U/L — AB (ref 14–54)
AST: 20 U/L (ref 15–41)
Albumin: 3.5 g/dL (ref 3.5–5.0)
Alkaline Phosphatase: 77 U/L (ref 38–126)
Anion gap: 3 — ABNORMAL LOW (ref 5–15)
BUN: 11 mg/dL (ref 6–20)
CHLORIDE: 105 mmol/L (ref 101–111)
CO2: 32 mmol/L (ref 22–32)
CREATININE: 0.75 mg/dL (ref 0.44–1.00)
Calcium: 8.2 mg/dL — ABNORMAL LOW (ref 8.9–10.3)
GFR calc Af Amer: >60 mL/min (ref >60)
GFR calc non Af Amer: >60 mL/min (ref >60)
GLUCOSE: 100 mg/dL — AB (ref 65–99)
POTASSIUM: 3 mmol/L — AB (ref 3.5–5.1)
Sodium: 140 mmol/L (ref 135–145)
Total Bilirubin: 0.4 mg/dL (ref 0.3–1.2)
Total Protein: 5.7 g/dL — ABNORMAL LOW (ref 6.5–8.1)

## 2014-11-27 NOTE — Progress Notes (Signed)
[place] @ [facility] Telephone:([phone]  Fax:(336) [place] OB: 1940-09-25  MR#: [id number]  ZYS#:[id number]  Patient Care Team: Rusty Aus, MD as PCP - General (Internal Medicine)  CHIEF COMPLAINT:  Chief Complaint  Patient presents with  . Follow-up     No history exists.    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  Patient has been started on [place] therapy from June of 2016 Presently tolerating only 300 mg daily tablets.  INTERVAL HISTORY:  74 year old lady with a history of gastrointestinal stromal tumor patient started back on Gleevec therapy could tolerate only 200 mg.  Taking a nausea pill at night followed by [place] therapy.  When she tried to take 300 patient became sick.  Has developed periorbital edema. Gradually gaining weight appetite is improving.Patient was advised to increase that to 300 mg Left upper quadrant discomfort.  No rectal bleeding.  No nausea.  No vomiting. November 27, 2014 Patient is here for ongoing evaluation and continuation of treatment.  Patient is gaining weight.  Tolerating 300 mg tablet very well.  No nausea.  No vomiting.  No abdominal pain.  Patient is here for further follow-up regarding gastrointestinal stromal tumor  Review of systems: Gen. status patient is feeling somewhat stronger.  Appetite is improved.  No abdominal pain. HEENT no soreness in the mouth. GI no nausea.  No vomiting.  No diarrhea. Cardiovascular system: No chest pain.  No palpitation.  No paroxysmal nocturnal dyspnea. Neurological system: No dizziness.  No tingling.  His was.  no tingling numbness.  no focal weakness or any focal signs. Skin: No evidence of ecchymosis or rash. Lower extremity no edema As per HPI. Otherwise, a complete review of systems is negatve.  PAST MEDICAL HISTORY: Diverticulitis. Degenerative arthritis disease Gastroesophageal  reflux disease  PAST SURGICAL HISTORY: Recent surgery for gastrointestinal stromal tumor followed by diverting colostomy because of complications now colostomy had been reversed. FAMILY HISTORY There is no significant family history of breast cancer, ovarian cancer, colon cancer ADVANCED DIRECTIVES:  She does have a living will  HEALTH MAINTENANCE: Social History  Substance Use Topics  . Smoking status: Never Smoker   . Smokeless tobacco: None  . Alcohol Use: None      No Known Allergies   OBJECTIVE:  Filed Vitals:   11/27/14 1011  BP: 157/71  Pulse: 83  Temp: 96.3 F (35.7 C)     There is no height on file to calculate BMI.    ECOG FS:1 - Symptomatic but completely ambulatory  PHYSICAL EXAM: Gen. status: Patient is alert and oriented not in acute distress Head exam was generally normal. There was no scleral icterus or corneal arcus. Mucous membranes were moist. Periorbital edema Examination of the chest was unremarkable. There were no bony deformities, no asymmetry, and no other abnormalities. Cardiac exam revealed the PMI to be normally situated and sized. The rhythm was regular and no extrasystoles were noted during several minutes of auscultation. The first and second heart sounds were normal and physiologic splitting of the second heart sound was noted. There were no murmurs, rubs, clicks, or gallops. Abdomen: Soft.  Liver and spleen not palpable.  No ascites. Lower extremity no edema. Examination of the skin revealed no evidence of significant rashes, suspicious appearing nevi or other concerning lesions. Neurologically, the patient was awake, alert, and oriented to person, place and time. There were no obvious focal neurologic  abnormalities.   LAB RESULTS:  Appointment on 11/27/2014  Component Date Value Ref Range Status  . WBC 11/27/2014 3.7  3.6 - 11.0 K/uL Final  . RBC 11/27/2014 2.95* 3.80 - 5.20 MIL/uL Final  . Hemoglobin 11/27/2014 9.8* 12.0 - 16.0 g/dL  Final  . HCT 11/27/2014 27.7* 35.0 - 47.0 % Final  . MCV 11/27/2014 93.8  80.0 - 100.0 fL Final  . MCH 11/27/2014 33.3  26.0 - 34.0 pg Final  . MCHC 11/27/2014 35.5  32.0 - 36.0 g/dL Final  . RDW 11/27/2014 16.2* 11.5 - 14.5 % Final  . Platelets 11/27/2014 209  150 - 440 K/uL Final  . Neutrophils Relative % 11/27/2014 64   Final  . Neutro Abs 11/27/2014 2.3  1.4 - 6.5 K/uL Final  . Lymphocytes Relative 11/27/2014 26   Final  . Lymphs Abs 11/27/2014 1.0  1.0 - 3.6 K/uL Final  . Monocytes Relative 11/27/2014 7   Final  . Monocytes Absolute 11/27/2014 0.2  0.2 - 0.9 K/uL Final  . Eosinophils Relative 11/27/2014 2   Final  . Eosinophils Absolute 11/27/2014 0.1  0 - 0.7 K/uL Final  . Basophils Relative 11/27/2014 1   Final  . Basophils Absolute 11/27/2014 0.0  0 - 0.1 K/uL Final  . Sodium 11/27/2014 140  135 - 145 mmol/L Final  . Potassium 11/27/2014 3.0* 3.5 - 5.1 mmol/L Final  . Chloride 11/27/2014 105  101 - 111 mmol/L Final  . CO2 11/27/2014 32  22 - 32 mmol/L Final  . Glucose, Bld 11/27/2014 100* 65 - 99 mg/dL Final  . BUN 11/27/2014 11  6 - 20 mg/dL Final  . Creatinine, Ser 11/27/2014 0.75  0.44 - 1.00 mg/dL Final  . Calcium 11/27/2014 8.2* 8.9 - 10.3 mg/dL Final  . Total Protein 11/27/2014 5.7* 6.5 - 8.1 g/dL Final  . Albumin 11/27/2014 3.5  3.5 - 5.0 g/dL Final  . AST 11/27/2014 20  15 - 41 U/L Final  . ALT 11/27/2014 11* 14 - 54 U/L Final  . Alkaline Phosphatase 11/27/2014 77  38 - 126 U/L Final  . Total Bilirubin 11/27/2014 0.4  0.3 - 1.2 mg/dL Final  . GFR calc non Af Amer 11/27/2014 >60  >60 mL/min Final  . GFR calc Af Amer 11/27/2014 >60  >60 mL/min Final   Comment: (NOTE) The eGFR has been calculated using the CKD EPI equation. This calculation has not been validated in all clinical situations. eGFR's persistently <60 mL/min signify possible Chronic Kidney Disease.   . Anion gap 11/27/2014 3* 5 - 15 Final       ASSESSMENT: Gastrointestinal stromal tumor status  post resection with multiple postoperative complications Patient is back on Gleevec therapy with side effect of nausea vomiting if those is exceeded more than 200 mg and periorbital edema.  MEDICAL DECISION MAKING:  All lab data has been reviewed.  Patient tolerated increased dose of 300 mg daily Will continue 300 mg.  Reevaluate patient in 4 weeks possibility of either PET scan or CT scan in November of 2016   Patient expressed understanding and was in agreement with this plan. She also understands that She can call clinic at any time with any questions, concerns, or complaints.    No matching staging information was found for the patient.  Forest Gleason, MD   11/27/2014 10:29 AM

## 2014-11-27 NOTE — Progress Notes (Signed)
Patient does have living will. Never smoked. 

## 2014-11-28 ENCOUNTER — Telehealth: Payer: Self-pay | Admitting: *Deleted

## 2014-11-28 NOTE — Telephone Encounter (Signed)
-----   Message from Forest Gleason, MD sent at 11/28/2014  7:48 AM EDT ----- Regarding: Potassium Potassium is low we want to be sure patient is taking potassium medication if needed we can increase oral supplement

## 2014-11-28 NOTE — Telephone Encounter (Signed)
Called patient and left message asking if she is taking her potassium.  Asked patient to call back and let us know.

## 2014-11-29 ENCOUNTER — Other Ambulatory Visit: Payer: Self-pay | Admitting: *Deleted

## 2014-11-29 DIAGNOSIS — C49A0 Gastrointestinal stromal tumor, unspecified site: Secondary | ICD-10-CM

## 2014-11-29 MED ORDER — POTASSIUM CHLORIDE ER 20 MEQ PO TBCR: 1.0000 | EXTENDED_RELEASE_TABLET | Freq: Every day | ORAL | Status: DC

## 2014-11-29 NOTE — Telephone Encounter (Signed)
Patient called back this morning (11-29-14)  to inform me she is not taking K+.  Called pt. @ 14:50 and left message that prescription for K+ has been called to pharmacy.  She should pick up prescription and start taking asap.

## 2014-12-17 ENCOUNTER — Telehealth: Payer: Self-pay | Admitting: *Deleted

## 2014-12-17 MED ORDER — PROMETHAZINE HCL 25 MG PO TABS: ORAL_TABLET | ORAL | Status: DC

## 2014-12-17 NOTE — Telephone Encounter (Signed)
Wants refill on Promethazine (escribed)  and to let us know that her cancer pill will be delivered tomorrow

## 2014-12-25 ENCOUNTER — Encounter: Payer: Self-pay | Admitting: Oncology

## 2014-12-25 ENCOUNTER — Inpatient Hospital Stay: Payer: Medicare PPO | Attending: Oncology

## 2014-12-25 ENCOUNTER — Inpatient Hospital Stay (HOSPITAL_BASED_OUTPATIENT_CLINIC_OR_DEPARTMENT_OTHER): Payer: Medicare PPO | Admitting: Oncology

## 2014-12-25 VITALS — BP 132/72 | HR 80 | Temp 95.8°F | Wt 123.9 lb

## 2014-12-25 DIAGNOSIS — H05229 Edema of unspecified orbit: Secondary | ICD-10-CM | POA: Insufficient documentation

## 2014-12-25 DIAGNOSIS — K219 Gastro-esophageal reflux disease without esophagitis: Secondary | ICD-10-CM | POA: Diagnosis not present

## 2014-12-25 DIAGNOSIS — R112 Nausea with vomiting, unspecified: Secondary | ICD-10-CM | POA: Diagnosis not present

## 2014-12-25 DIAGNOSIS — Z79899 Other long term (current) drug therapy: Secondary | ICD-10-CM | POA: Diagnosis not present

## 2014-12-25 DIAGNOSIS — K5790 Diverticulosis of intestine, part unspecified, without perforation or abscess without bleeding: Secondary | ICD-10-CM

## 2014-12-25 DIAGNOSIS — C49A0 Gastrointestinal stromal tumor, unspecified site: Secondary | ICD-10-CM

## 2014-12-25 DIAGNOSIS — M1990 Unspecified osteoarthritis, unspecified site: Secondary | ICD-10-CM | POA: Diagnosis not present

## 2014-12-25 DIAGNOSIS — R1012 Left upper quadrant pain: Secondary | ICD-10-CM | POA: Insufficient documentation

## 2014-12-25 DIAGNOSIS — D649 Anemia, unspecified: Secondary | ICD-10-CM | POA: Insufficient documentation

## 2014-12-25 LAB — CBC WITH DIFFERENTIAL/PLATELET
Basophils Absolute: 0 10*3/uL (ref 0–0.1)
Basophils Relative: 1 %
Eosinophils Absolute: 0.2 10*3/uL (ref 0–0.7)
Eosinophils Relative: 4 %
HEMATOCRIT: 28.7 % — AB (ref 35.0–47.0)
HEMOGLOBIN: 10 g/dL — AB (ref 12.0–16.0)
LYMPHS ABS: 1.2 10*3/uL (ref 1.0–3.6)
Lymphocytes Relative: 29 %
MCH: 33.4 pg (ref 26.0–34.0)
MCHC: 34.9 g/dL (ref 32.0–36.0)
MCV: 95.6 fL (ref 80.0–100.0)
MONOS PCT: 7 %
Monocytes Absolute: 0.3 10*3/uL (ref 0.2–0.9)
NEUTROS ABS: 2.5 10*3/uL (ref 1.4–6.5)
NEUTROS PCT: 59 %
Platelets: 314 10*3/uL (ref 150–440)
RBC: 3 MIL/uL — ABNORMAL LOW (ref 3.80–5.20)
RDW: 13.7 % (ref 11.5–14.5)
WBC: 4.2 10*3/uL (ref 3.6–11.0)

## 2014-12-25 LAB — COMPREHENSIVE METABOLIC PANEL
ALBUMIN: 3.7 g/dL (ref 3.5–5.0)
ALK PHOS: 56 U/L (ref 38–126)
ALT: 11 U/L — AB (ref 14–54)
ANION GAP: 7 (ref 5–15)
AST: 18 U/L (ref 15–41)
BILIRUBIN TOTAL: 0.5 mg/dL (ref 0.3–1.2)
BUN: 13 mg/dL (ref 6–20)
CALCIUM: 8.9 mg/dL (ref 8.9–10.3)
CO2: 30 mmol/L (ref 22–32)
CREATININE: 0.83 mg/dL (ref 0.44–1.00)
Chloride: 104 mmol/L (ref 101–111)
GFR calc non Af Amer: >60 mL/min (ref >60)
GLUCOSE: 96 mg/dL (ref 65–99)
Potassium: 3.2 mmol/L — ABNORMAL LOW (ref 3.5–5.1)
Sodium: 141 mmol/L (ref 135–145)
TOTAL PROTEIN: 6.2 g/dL — AB (ref 6.5–8.1)

## 2014-12-25 LAB — MAGNESIUM: Magnesium: 1.4 mg/dL — ABNORMAL LOW (ref 1.7–2.4)

## 2014-12-25 NOTE — Progress Notes (Signed)
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  Patient has been started on [place] therapy from June of 2016 Presently tolerating only 300 mg daily tablets.  INTERVAL HISTORY:  74 year old lady with a history of gastrointestinal stromal tumor patient started back on Gleevec therapy could tolerate only 200 mg.  Taking a nausea pill at night followed by [place] therapy.  When she tried to take 300 patient became sick.  Has developed periorbital edema. Gradually gaining weight appetite is improving.Patient was advised to increase that to 300 mg Left upper quadrant discomfort.  No rectal bleeding.  No nausea.  No vomiting. Patient is here for ongoing evaluation and treatment consideration.  Has developed periorbital swelling Tolerating treatment with 300 mg of Gleevec No abdominal pain.  No diarrhea.  Review of systems: Gen. status patient is feeling somewhat stronger.  Appetite is improved.  No abdominal pain. HEENT no soreness in the mouth. GI no nausea.  No vomiting.  No diarrhea. Cardiovascular system: No chest pain.  No palpitation.  No paroxysmal nocturnal dyspnea. Neurological system: No dizziness.  No tingling.  His was.  no tingling numbness.  no focal weakness or any focal signs. Skin: No evidence of ecchymosis or rash. Lower extremity no edema As per HPI. Otherwise, a complete review of systems is negatve.  PAST MEDICAL HISTORY: Diverticulitis. Degenerative arthritis disease Gastroesophageal reflux disease  PAST SURGICAL HISTORY: Recent surgery for gastrointestinal stromal tumor followed by diverting colostomy because of complications now colostomy had been  reversed. FAMILY HISTORY There is no significant family history of breast cancer, ovarian cancer, colon cancer ADVANCED DIRECTIVES:  She does have a living will  HEALTH MAINTENANCE: Social History  Substance Use Topics  . Smoking status: Never Smoker   . Smokeless tobacco: None  . Alcohol Use: None      No Known Allergies   OBJECTIVE:  Filed Vitals:   12/25/14 1104  BP: 132/72  Pulse: 80  Temp: 95.8 F (35.4 C)     There is no height on file to calculate BMI.    ECOG FS:1 - Symptomatic but completely ambulatory  PHYSICAL EXAM: Gen. status: Patient is alert and oriented not in acute distress Head exam was generally normal. There was no scleral icterus or corneal arcus. Mucous membranes were moist. Periorbital edema Examination of the chest was unremarkable. There were no bony deformities, no asymmetry, and no other abnormalities. Cardiac exam revealed the PMI to be normally situated and sized. The rhythm was regular and no extrasystoles were noted during several minutes of auscultation. The first and second heart sounds were normal and physiologic splitting of the second heart sound was noted. There were no murmurs, rubs, clicks, or gallops. Abdomen: Soft.  Liver and spleen not palpable.  No ascites. Lower extremity no edema. Examination of the skin revealed no evidence of significant rashes, suspicious appearing nevi or other concerning lesions. Neurologically, the patient was awake, alert, and oriented to person, place and time. There were no obvious focal neurologic abnormalities.   LAB RESULTS:  Appointment on 12/25/2014  Component Date Value Ref Range Status  . WBC 12/25/2014 4.2  3.6 - 11.0 K/uL Final  . RBC 12/25/2014 3.00* 3.80 - 5.20 MIL/uL  Final  . Hemoglobin 12/25/2014 10.0* 12.0 - 16.0 g/dL Final  . HCT 12/25/2014 28.7* 35.0 - 47.0 % Final  . MCV 12/25/2014 95.6  80.0 - 100.0 fL Final  . MCH 12/25/2014 33.4  26.0 - 34.0 pg Final  . MCHC 12/25/2014 34.9   32.0 - 36.0 g/dL Final  . RDW 12/25/2014 13.7  11.5 - 14.5 % Final  . Platelets 12/25/2014 314  150 - 440 K/uL Final  . Neutrophils Relative % 12/25/2014 59   Final  . Neutro Abs 12/25/2014 2.5  1.4 - 6.5 K/uL Final  . Lymphocytes Relative 12/25/2014 29   Final  . Lymphs Abs 12/25/2014 1.2  1.0 - 3.6 K/uL Final  . Monocytes Relative 12/25/2014 7   Final  . Monocytes Absolute 12/25/2014 0.3  0.2 - 0.9 K/uL Final  . Eosinophils Relative 12/25/2014 4   Final  . Eosinophils Absolute 12/25/2014 0.2  0 - 0.7 K/uL Final  . Basophils Relative 12/25/2014 1   Final  . Basophils Absolute 12/25/2014 0.0  0 - 0.1 K/uL Final  . Sodium 12/25/2014 141  135 - 145 mmol/L Final  . Potassium 12/25/2014 3.2* 3.5 - 5.1 mmol/L Final  . Chloride 12/25/2014 104  101 - 111 mmol/L Final  . CO2 12/25/2014 30  22 - 32 mmol/L Final  . Glucose, Bld 12/25/2014 96  65 - 99 mg/dL Final  . BUN 12/25/2014 13  6 - 20 mg/dL Final  . Creatinine, Ser 12/25/2014 0.83  0.44 - 1.00 mg/dL Final  . Calcium 12/25/2014 8.9  8.9 - 10.3 mg/dL Final  . Total Protein 12/25/2014 6.2* 6.5 - 8.1 g/dL Final  . Albumin 12/25/2014 3.7  3.5 - 5.0 g/dL Final  . AST 12/25/2014 18  15 - 41 U/L Final  . ALT 12/25/2014 11* 14 - 54 U/L Final  . Alkaline Phosphatase 12/25/2014 56  38 - 126 U/L Final  . Total Bilirubin 12/25/2014 0.5  0.3 - 1.2 mg/dL Final  . GFR calc non Af Amer 12/25/2014 >60  >60 mL/min Final  . GFR calc Af Amer 12/25/2014 >60  >60 mL/min Final   Comment: (NOTE) The eGFR has been calculated using the CKD EPI equation. This calculation has not been validated in all clinical situations. eGFR's persistently <60 mL/min signify possible Chronic Kidney Disease.   . Anion gap 12/25/2014 7  5 - 15 Final  . Magnesium 12/25/2014 1.4* 1.7 - 2.4 mg/dL Final       ASSESSMENT: Gastrointestinal stromal tumor status post resection with multiple postoperative complications Patient is back on Gleevec therapy with side effect of nausea  vomiting if those is exceeded more than 200 mg and periorbital edema.  MEDICAL DECISION MAKING:  All lab data has been reviewed.  she  has mild anemia.  We will repeat CT scan ordered a PET scan prior to next appointment. Periorbital edema secondary to direct therapy Patient tolerated increased dose of 300 mg daily Will continue 300 mg.  Reevaluate patient in 4 weeks possibility of either PET scan or CT scan in November of 2016   Patient expressed understanding and was in agreement with this plan. She also understands that She can call clinic at any time with any questions, concerns, or complaints.    No matching staging information was found for the patient.  Forest Gleason, MD   12/25/2014 12:03 PM

## 2014-12-25 NOTE — Progress Notes (Signed)
Patient does have living will.  Former smoker. 

## 2014-12-27 ENCOUNTER — Other Ambulatory Visit: Payer: Self-pay | Admitting: Ophthalmology

## 2014-12-27 DIAGNOSIS — H532 Diplopia: Secondary | ICD-10-CM

## 2014-12-29 ENCOUNTER — Ambulatory Visit
Admission: RE | Admit: 2014-12-29 | Discharge: 2014-12-29 | Disposition: A | Discharge status: Home / Self Care | Payer: Medicare PPO | Source: Ambulatory Visit | Attending: Ophthalmology | Admitting: Ophthalmology

## 2014-12-29 DIAGNOSIS — H532 Diplopia: Secondary | ICD-10-CM | POA: Insufficient documentation

## 2014-12-29 DIAGNOSIS — I6782 Cerebral ischemia: Secondary | ICD-10-CM | POA: Insufficient documentation

## 2014-12-29 MED ORDER — GADOBENATE DIMEGLUMINE 529 MG/ML IV SOLN
15.0000 mL | Freq: Once | INTRAVENOUS | Status: AC | PRN
  Administered 2014-12-29: 11 mL via INTRAVENOUS

## 2014-12-30 ENCOUNTER — Encounter: Payer: Self-pay | Admitting: Oncology

## 2014-12-31 ENCOUNTER — Telehealth: Payer: Self-pay | Admitting: *Deleted

## 2014-12-31 NOTE — Telephone Encounter (Signed)
Courtney [name] called to inform us that their office has arranged for MRI of brain due to double vision pt is experiencing. Pt just wanted to let our office know about the MRI for Dr. Oliva Bustard to review as well.

## 2015-01-21 ENCOUNTER — Encounter: Payer: Self-pay | Admitting: *Deleted

## 2015-01-24 ENCOUNTER — Ambulatory Visit
Admission: RE | Admit: 2015-01-24 | Discharge: 2015-01-24 | Disposition: A | Discharge status: Home / Self Care | Payer: Medicare PPO | Source: Ambulatory Visit | Attending: Oncology | Admitting: Oncology

## 2015-01-24 DIAGNOSIS — C49A0 Gastrointestinal stromal tumor, unspecified site: Secondary | ICD-10-CM | POA: Insufficient documentation

## 2015-01-24 DIAGNOSIS — Z9049 Acquired absence of other specified parts of digestive tract: Secondary | ICD-10-CM | POA: Insufficient documentation

## 2015-01-24 MED ORDER — IOHEXOL 300 MG/ML  SOLN
100.0000 mL | Freq: Once | INTRAMUSCULAR | Status: AC | PRN
  Administered 2015-01-24: 100 mL via INTRAVENOUS

## 2015-01-28 ENCOUNTER — Ambulatory Visit: Payer: Medicare PPO | Admitting: Anesthesiology

## 2015-01-28 ENCOUNTER — Ambulatory Visit: Payer: Medicare PPO | Admitting: Oncology

## 2015-01-28 ENCOUNTER — Encounter: Payer: Self-pay | Admitting: *Deleted

## 2015-01-28 ENCOUNTER — Encounter
Admission: RE | Disposition: A | Discharge status: Home / Self Care | Payer: Self-pay | Source: Ambulatory Visit | Attending: Ophthalmology

## 2015-01-28 ENCOUNTER — Other Ambulatory Visit: Payer: Medicare PPO

## 2015-01-28 ENCOUNTER — Ambulatory Visit
Admission: RE | Admit: 2015-01-28 | Discharge: 2015-01-28 | Disposition: A | Discharge status: Home / Self Care | Payer: Medicare PPO | Source: Ambulatory Visit | Attending: Ophthalmology | Admitting: Ophthalmology

## 2015-01-28 DIAGNOSIS — I10 Essential (primary) hypertension: Secondary | ICD-10-CM | POA: Insufficient documentation

## 2015-01-28 DIAGNOSIS — Z79899 Other long term (current) drug therapy: Secondary | ICD-10-CM | POA: Diagnosis not present

## 2015-01-28 DIAGNOSIS — K219 Gastro-esophageal reflux disease without esophagitis: Secondary | ICD-10-CM | POA: Diagnosis not present

## 2015-01-28 DIAGNOSIS — Z880 Allergy status to penicillin: Secondary | ICD-10-CM | POA: Insufficient documentation

## 2015-01-28 DIAGNOSIS — M1990 Unspecified osteoarthritis, unspecified site: Secondary | ICD-10-CM | POA: Insufficient documentation

## 2015-01-28 DIAGNOSIS — H2511 Age-related nuclear cataract, right eye: Secondary | ICD-10-CM | POA: Diagnosis not present

## 2015-01-28 HISTORY — DX: Personal history of other diseases of the digestive system: Z87.19

## 2015-01-28 HISTORY — DX: Malignant (primary) neoplasm, unspecified: C80.1

## 2015-01-28 HISTORY — DX: Essential (primary) hypertension: I10

## 2015-01-28 HISTORY — DX: Unspecified osteoarthritis, unspecified site: M19.90

## 2015-01-28 HISTORY — PX: CATARACT EXTRACTION W/PHACO: SHX586

## 2015-01-28 HISTORY — DX: Tremor, unspecified: R25.1

## 2015-01-28 HISTORY — DX: Gastro-esophageal reflux disease without esophagitis: K21.9

## 2015-01-28 SURGERY — PHACOEMULSIFICATION, CATARACT, WITH IOL INSERTION
Anesthesia: Monitor Anesthesia Care | Site: Eye | Laterality: Right | Wound class: Clean

## 2015-01-28 MED ORDER — LIDOCAINE HCL (PF) 4 % IJ SOLN
INTRAOCULAR | Status: DC | PRN
  Administered 2015-01-28: 1 mL via OPHTHALMIC

## 2015-01-28 MED ORDER — PHENYLEPHRINE HCL 10 % OP SOLN
OPHTHALMIC | Status: AC
  Filled 2015-01-28: qty 5

## 2015-01-28 MED ORDER — SODIUM CHLORIDE 0.9 % IV SOLN: INTRAVENOUS | Status: DC

## 2015-01-28 MED ORDER — HYALURONIDASE HUMAN 150 UNIT/ML IJ SOLN
INTRAMUSCULAR | Status: AC
  Filled 2015-01-28: qty 1

## 2015-01-28 MED ORDER — TETRACAINE HCL 0.5 % OP SOLN
OPHTHALMIC | Status: DC | PRN
  Administered 2015-01-28: 1 [drp] via OPHTHALMIC

## 2015-01-28 MED ORDER — LACTATED RINGERS IV SOLN
INTRAVENOUS | Status: DC | PRN
  Administered 2015-01-28: 10:00:00 via INTRAVENOUS

## 2015-01-28 MED ORDER — CYCLOPENTOLATE HCL 2 % OP SOLN
OPHTHALMIC | Status: AC
  Filled 2015-01-28: qty 2

## 2015-01-28 MED ORDER — MOXIFLOXACIN HCL 0.5 % OP SOLN
OPHTHALMIC | Status: DC | PRN
  Administered 2015-01-28: 1 [drp] via OPHTHALMIC

## 2015-01-28 MED ORDER — PHENYLEPHRINE HCL 10 % OP SOLN
1.0000 [drp] | OPHTHALMIC | Status: DC | PRN
Start: 2015-01-28 — End: 2015-01-28
  Administered 2015-01-28: 1 [drp] via OPHTHALMIC

## 2015-01-28 MED ORDER — TETRACAINE HCL 0.5 % OP SOLN
OPHTHALMIC | Status: AC
  Filled 2015-01-28: qty 2

## 2015-01-28 MED ORDER — CEFUROXIME OPHTHALMIC INJECTION 1 MG/0.1 ML
INJECTION | OPHTHALMIC | Status: AC
  Filled 2015-01-28: qty 0.1

## 2015-01-28 MED ORDER — NA CHONDROIT SULF-NA HYALURON 40-17 MG/ML IO SOLN
INTRAOCULAR | Status: DC | PRN
  Administered 2015-01-28: 1 mL via INTRAOCULAR

## 2015-01-28 MED ORDER — NA CHONDROIT SULF-NA HYALURON 40-17 MG/ML IO SOLN
INTRAOCULAR | Status: AC
  Filled 2015-01-28: qty 1

## 2015-01-28 MED ORDER — LIDOCAINE HCL (PF) 4 % IJ SOLN
INTRAMUSCULAR | Status: AC
  Filled 2015-01-28: qty 5

## 2015-01-28 MED ORDER — CYCLOPENTOLATE HCL 2 % OP SOLN
1.0000 [drp] | OPHTHALMIC | Status: DC | PRN
  Administered 2015-01-28: 1 [drp] via OPHTHALMIC

## 2015-01-28 MED ORDER — BUPIVACAINE HCL (PF) 0.75 % IJ SOLN
INTRAMUSCULAR | Status: AC
  Filled 2015-01-28: qty 10

## 2015-01-28 MED ORDER — LIDOCAINE HCL (PF) 4 % IJ SOLN
INTRAMUSCULAR | Status: DC | PRN
  Administered 2015-01-28: 5 mL via OPHTHALMIC

## 2015-01-28 MED ORDER — ALFENTANIL 500 MCG/ML IJ INJ
INJECTION | INTRAMUSCULAR | Status: DC | PRN
  Administered 2015-01-28: 500 ug via INTRAVENOUS

## 2015-01-28 MED ORDER — CEFUROXIME OPHTHALMIC INJECTION 1 MG/0.1 ML: INJECTION | OPHTHALMIC | Status: DC | PRN

## 2015-01-28 MED ORDER — MIDAZOLAM HCL 2 MG/2ML IJ SOLN
INTRAMUSCULAR | Status: DC | PRN
  Administered 2015-01-28: .5 mg via INTRAVENOUS

## 2015-01-28 MED ORDER — EPINEPHRINE HCL 1 MG/ML IJ SOLN
INTRAMUSCULAR | Status: AC
  Filled 2015-01-28: qty 1

## 2015-01-28 MED ORDER — MOXIFLOXACIN HCL 0.5 % OP SOLN
OPHTHALMIC | Status: DC
Start: 2015-01-28 — End: 2015-01-28
  Filled 2015-01-28: qty 3

## 2015-01-28 MED ORDER — EPINEPHRINE HCL 1 MG/ML IJ SOLN
INTRAOCULAR | Status: DC | PRN
  Administered 2015-01-28: 1 mL via OPHTHALMIC

## 2015-01-28 MED ORDER — MOXIFLOXACIN HCL 0.5 % OP SOLN
1.0000 [drp] | OPHTHALMIC | Status: DC | PRN
  Administered 2015-01-28: 1 [drp] via OPHTHALMIC

## 2015-01-28 SURGICAL SUPPLY — 29 items

## 2015-01-28 NOTE — Op Note (Signed)
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 Item Serial No. Manufacturer Lot No. LRB No. Used  LENS IOL ACRYSOF IQ 19.5 - [id number] Intraocular Lens LENS IOL ACRYSOF IQ 19.5 [id number] ALCON   Right 1   Anesthesia: 2% Lidocaine and 4% Marcaine in a 50/50 mixture with 10 unites/ml of Hylenex given as a peribulbar Anesthesiologist: Anesthesiologist: Gunnar Bulla, MD CRNA: Allean Found, CRNA Complications: none Estimated Blood Loss: less than 1 ml  Description of procedure:  The patient was given anesthesia and sedation via intravenous access. The patient was then prepped and draped in the usual fashion. A 25-gauge needle was bent for initiating the capsulorhexis. A 5-0 silk suture was placed through the conjunctiva superior and inferiorly to serve as bridle sutures. Hemostasis was obtained at the superior limbus using an eraser cautery. A partial thickness groove was made at the anterior surgical limbus with a 64 Beaver blade and this was dissected anteriorly with an Avaya. The anterior chamber was entered at 10 o'clock with a 1.0 mm paracentesis knife and through the lamellar dissection with a 2.6 mm Alcon keratome. Epi-Shugarcaine 0.5 CC [9 cc BSS Plus (Alcon), 3 cc 4% preservative-free lidocaine (Hospira) and 4 cc 1:1000 preservative-free, bisulfite-free epinephrine] was injected into the anterior chamber via the paracentesis tract. Epi-Shugarcaine 0.5 CC [9 cc BSS Plus (Alcon), 3 cc 4% preservative-free lidocaine (Hospira) and 4 cc 1:1000 preservative-free, bisulfite-free epinephrine] was injected into the anterior chamber via the paracentesis tract. DiscoVisc was injected to  replace the aqueous and a continuous tear curvilinear capsulorhexis was performed using a bent 25-gauge needle.  Balance salt on a syringe was used to perform hydro-dissection and phacoemulsification was carried out using a divide and conquer technique. Procedure(s) with comments: CATARACT EXTRACTION PHACO AND INTRAOCULAR LENS PLACEMENT (IOC) (Right) - [name] 00:59.3 AP% 22.6 CDE 24.69 fluid pack lot #[id number]. Irrigation/aspiration was used to remove the residual cortex and the capsular bag was inflated with DiscoVisc. The intraocular lens was inserted into the capsular bag using a pre-loaded Acrysert Delivery System. Irrigation/aspiration was used to remove the residual DiscoVisc. The wound was inflated with balanced salt and checked for leaks. None were found. Miostat was injected via the paracentesis track and 0.1 ml of Vigamox containing 1 mg of drug  was injected via the paracentesis track. The wound was checked for leaks again and none were found.   The bridal sutures were removed and two drops of Vigamox were placed on the eye. An eye shield was placed to protect the eye and the patient was discharged to the recovery area in good condition.   Skippy Marhefka MD

## 2015-01-28 NOTE — Anesthesia Procedure Notes (Signed)
Procedure Name: MAC Date/Time: 01/28/2015 9:30 AM Performed by: Allean Found Pre-anesthesia Checklist: Patient identified, Emergency Drugs available, Suction available, Patient being monitored and Timeout performed Oxygen Delivery Method: Nasal cannula Intubation Type: IV induction

## 2015-01-28 NOTE — Anesthesia Preprocedure Evaluation (Signed)
Anesthesia Evaluation  Patient identified by MRN, date of birth, ID band Patient awake    Reviewed: Allergy & Precautions, NPO status , Patient's Chart, lab work & pertinent test results, reviewed documented beta blocker date and time   Airway Mallampati: II  TM Distance: >3 FB     Dental  (+) Chipped   Pulmonary           Cardiovascular hypertension, Pt. on medications      Neuro/Psych    GI/Hepatic hiatal hernia, GERD  Medicated and Controlled,  Endo/Other    Renal/GU      Musculoskeletal  (+) Arthritis ,   Abdominal   Peds  Hematology   Anesthesia Other Findings   Reproductive/Obstetrics                             Anesthesia Physical Anesthesia Plan  ASA: III  Anesthesia Plan: MAC   Post-op Pain Management:    Induction:   Airway Management Planned:   Additional Equipment:   Intra-op Plan:   Post-operative Plan:   Informed Consent: I have reviewed the patients History and Physical, chart, labs and discussed the procedure including the risks, benefits and alternatives for the proposed anesthesia with the patient or authorized representative who has indicated his/her understanding and acceptance.     Plan Discussed with: CRNA  Anesthesia Plan Comments:         Anesthesia Quick Evaluation

## 2015-01-28 NOTE — Discharge Instructions (Addendum)
Eye Surgery Discharge Instructions  Expect mild scratchy sensation or mild soreness. DO NOT RUB YOUR EYE!  The day of surgery:  Minimal physical activity, but bed rest is not required  No reading, computer work, or close hand work  No bending, lifting, or straining.  May watch TV  For 24 hours:  No driving, legal decisions, or alcoholic beverages  Safety precautions  Eat anything you prefer: It is better to start with liquids, then soup then solid foods.  _____ Eye patch should be worn until postoperative exam tomorrow.  ____ Solar shield eyeglasses should be worn for comfort in the sunlight/patch while sleeping  Resume all regular medications including aspirin or Coumadin if these were discontinued prior to surgery. You may shower, bathe, shave, or wash your hair. Tylenol may be taken for mild discomfort.  Call your doctor if you experience significant pain, nausea, or vomiting, fever > 101 or other signs of infection. [phone] or [phone] Specific instructions:  Follow-up Information    Follow up with Estill Cotta, MD.   Specialty:  Ophthalmology   Why:  Tuesday 01/29/15 @10 :40 am   Contact information:   [address]   [place] [address] [phone]     See handout

## 2015-01-28 NOTE — H&P (Signed)
See scanned note.

## 2015-01-28 NOTE — Transfer of Care (Signed)
Immediate Anesthesia Transfer of Care Note  Patient: Courtney [name]  Procedure(s) Performed: Procedure(s) with comments: CATARACT EXTRACTION PHACO AND INTRAOCULAR LENS PLACEMENT (IOC) (Right) - [name] 00:59.3 AP% 22.6 CDE 24.69 fluid pack lot #[id number]  Patient Location: PACU  Anesthesia Type:MAC  Level of Consciousness: awake  Airway & Oxygen Therapy: Patient Spontanous Breathing  Post-op Assessment: Report given to RN and Post -op Vital signs reviewed and stable  Post vital signs: Reviewed and stable  Last Vitals:  Filed Vitals:   01/28/15 1056  BP: 139/65  Pulse: 70  Temp: 35.3 C  Resp: 16    Complications: No apparent anesthesia complications

## 2015-01-28 NOTE — Anesthesia Postprocedure Evaluation (Signed)
  Anesthesia Post-op Note  Patient: Yarnell Kozloski [employer]  Procedure(s) Performed: Procedure(s) with comments: CATARACT EXTRACTION PHACO AND INTRAOCULAR LENS PLACEMENT (IOC) (Right) - [name] 00:59.3 AP% 22.6 CDE 24.69 fluid pack lot #[id number]  Anesthesia type:MAC  Patient location: PACU  Post pain: Pain level controlled  Post assessment: Post-op Vital signs reviewed, Patient's Cardiovascular Status Stable, Respiratory Function Stable, Patent Airway and No signs of Nausea or vomiting  Post vital signs: Reviewed and stable  Last Vitals:  Filed Vitals:   01/28/15 1116  BP: 136/74  Pulse: 71  Temp: 35.7 C  Resp: 16    Level of consciousness: awake, alert  and patient cooperative  Complications: No apparent anesthesia complications

## 2015-01-28 NOTE — Interval H&P Note (Signed)
History and Physical Interval Note:  01/28/2015 10:11 AM  Courtney [name]  has presented today for surgery, with the diagnosis of nuclear sclerotic cataract right eye  The various methods of treatment have been discussed with the patient and family. After consideration of risks, benefits and other options for treatment, the patient has consented to  Procedure(s) with comments: CATARACT EXTRACTION PHACO AND INTRAOCULAR LENS PLACEMENT (IOC) (Right) - [name] AP% CDE fluid pack lot #[id number] as a surgical intervention .  The patient's history has been reviewed, patient examined, no change in status, stable for surgery.  I have reviewed the patient's chart and labs.  Questions were answered to the patient's satisfaction.     [name]

## 2015-01-29 ENCOUNTER — Inpatient Hospital Stay: Payer: Medicare PPO | Attending: Oncology | Admitting: Oncology

## 2015-01-29 ENCOUNTER — Encounter: Payer: Self-pay | Admitting: Oncology

## 2015-01-29 ENCOUNTER — Inpatient Hospital Stay: Payer: Medicare PPO

## 2015-01-29 VITALS — BP 157/72 | HR 84 | Temp 95.8°F | Wt 124.0 lb

## 2015-01-29 DIAGNOSIS — M1990 Unspecified osteoarthritis, unspecified site: Secondary | ICD-10-CM | POA: Insufficient documentation

## 2015-01-29 DIAGNOSIS — K5790 Diverticulosis of intestine, part unspecified, without perforation or abscess without bleeding: Secondary | ICD-10-CM | POA: Diagnosis not present

## 2015-01-29 DIAGNOSIS — K219 Gastro-esophageal reflux disease without esophagitis: Secondary | ICD-10-CM | POA: Diagnosis not present

## 2015-01-29 DIAGNOSIS — E876 Hypokalemia: Secondary | ICD-10-CM | POA: Diagnosis not present

## 2015-01-29 DIAGNOSIS — D649 Anemia, unspecified: Secondary | ICD-10-CM

## 2015-01-29 DIAGNOSIS — C49A0 Gastrointestinal stromal tumor, unspecified site: Secondary | ICD-10-CM

## 2015-01-29 LAB — CBC WITH DIFFERENTIAL/PLATELET
BASOS ABS: 0 10*3/uL (ref 0–0.1)
BASOS PCT: 1 %
EOS ABS: 0.1 10*3/uL (ref 0–0.7)
EOS PCT: 2 %
HEMATOCRIT: 27.1 % — AB (ref 35.0–47.0)
HEMOGLOBIN: 9.2 g/dL — AB (ref 12.0–16.0)
Lymphocytes Relative: 32 %
Lymphs Abs: 1.1 10*3/uL (ref 1.0–3.6)
MCH: 32.5 pg (ref 26.0–34.0)
MCHC: 33.8 g/dL (ref 32.0–36.0)
MCV: 96.3 fL (ref 80.0–100.0)
MONO ABS: 0.2 10*3/uL (ref 0.2–0.9)
Monocytes Relative: 7 %
NEUTROS ABS: 1.9 10*3/uL (ref 1.4–6.5)
Neutrophils Relative %: 58 %
Platelets: 232 10*3/uL (ref 150–440)
RBC: 2.81 MIL/uL — ABNORMAL LOW (ref 3.80–5.20)
RDW: 13.8 % (ref 11.5–14.5)
WBC: 3.3 10*3/uL — ABNORMAL LOW (ref 3.6–11.0)

## 2015-01-29 LAB — COMPREHENSIVE METABOLIC PANEL
ALBUMIN: 3.8 g/dL (ref 3.5–5.0)
ALK PHOS: 54 U/L (ref 38–126)
ALT: 14 U/L (ref 14–54)
ANION GAP: 4 — AB (ref 5–15)
AST: 24 U/L (ref 15–41)
BILIRUBIN TOTAL: 0.4 mg/dL (ref 0.3–1.2)
BUN: 13 mg/dL (ref 6–20)
CO2: 29 mmol/L (ref 22–32)
CREATININE: 0.8 mg/dL (ref 0.44–1.00)
Calcium: 9 mg/dL (ref 8.9–10.3)
Chloride: 106 mmol/L (ref 101–111)
GLUCOSE: 97 mg/dL (ref 65–99)
POTASSIUM: 3.7 mmol/L (ref 3.5–5.1)
Sodium: 139 mmol/L (ref 135–145)
Total Protein: 6.3 g/dL — ABNORMAL LOW (ref 6.5–8.1)

## 2015-01-29 LAB — MAGNESIUM: Magnesium: 1.5 mg/dL — ABNORMAL LOW (ref 1.7–2.4)

## 2015-01-29 MED ORDER — IMATINIB MESYLATE 100 MG PO TABS: ORAL_TABLET | ORAL | Status: DC

## 2015-01-29 NOTE — Progress Notes (Signed)
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  Patient has been started on [place] therapy from June of 2016 Presently tolerating only 300 mg daily tablets.  INTERVAL HISTORY:  74 year old lady with a history of gastrointestinal stromal tumor patient started back on Gleevec therapy could tolerate only 200 mg.  Taking a nausea pill at night followed by [place] therapy.  When she tried to take 300 patient became sick.  Has developed periorbital edema. Gradually gaining weight appetite is improving.Patient was advised to increase that to 300 mg Left upper quadrant discomfort.  No rectal bleeding.  No nausea.  No vomiting. Recent had number of complaints with hemorrhoids which is responded now to sitz bath  Patient is off prednisone so has lost some weight. Patient has a repeat CT scan here to discuss the results.  Patient is taking 300 mg] present time Patient had a recent cataract surgery  Review of systems: Gen. status patient is feeling somewhat stronger.  Appetite is improved.  No abdominal pain. HEENT no soreness in the mouth. GI no nausea.  No vomiting.  No diarrhea. Cardiovascular system: No chest pain.  No palpitation.  No paroxysmal nocturnal dyspnea. Neurological system: No dizziness.  No tingling.  His was.  no tingling numbness.  no focal weakness or any focal signs. Skin: No evidence of ecchymosis or rash. Lower extremity no edema As per HPI. Otherwise, a complete review of systems is negatve.  PAST MEDICAL HISTORY: Diverticulitis. Degenerative arthritis disease Gastroesophageal reflux disease  PAST SURGICAL HISTORY: Recent surgery for gastrointestinal stromal  tumor followed by diverting colostomy because of complications now colostomy had been reversed. FAMILY HISTORY There is no significant family history of breast cancer, ovarian cancer, colon cancer ADVANCED DIRECTIVES:  She does have a living will  HEALTH MAINTENANCE: Social History  Substance Use Topics  . Smoking status: Never Smoker   . Smokeless tobacco: None  . Alcohol Use: No      Allergies  Allergen Reactions  . Penicillins Swelling    HIVES     OBJECTIVE:  Filed Vitals:   01/29/15 0849  BP: 157/72  Pulse: 84  Temp: 95.8 F (35.4 C)     Body mass index is 25.03 kg/(m^2).    ECOG FS:1 - Symptomatic but completely ambulatory  PHYSICAL EXAM: Gen. status: Patient is alert and oriented not in acute distress Head exam was generally normal. There was no scleral icterus or corneal arcus. Mucous membranes were moist. Periorbital edema Examination of the chest was unremarkable. There were no bony deformities, no asymmetry, and no other abnormalities. Cardiac exam revealed the PMI to be normally situated and sized. The rhythm was regular and no extrasystoles were noted during several minutes of auscultation. The first and second heart sounds were normal and physiologic splitting of the second heart sound was noted. There were no murmurs, rubs, clicks, or gallops. Abdomen: Soft.  Liver and spleen not palpable.  No ascites. Lower extremity no edema. Examination of the skin revealed no evidence of significant rashes, suspicious appearing nevi or other concerning lesions. Neurologically, the patient was awake, alert, and oriented to person, place and time. There were no obvious focal neurologic abnormalities.   LAB RESULTS:  Appointment on 01/29/2015  Component Date Value Ref Range Status  . WBC 01/29/2015 3.3* 3.6 - 11.0 K/uL Final  . RBC 01/29/2015 2.81* 3.80 - 5.20 MIL/uL Final  . Hemoglobin 01/29/2015 9.2* 12.0 - 16.0 g/dL Final  . HCT 01/29/2015 27.1* 35.0 - 47.0 %  Final  . MCV 01/29/2015 96.3  80.0 - 100.0 fL Final  . MCH 01/29/2015 32.5  26.0 - 34.0 pg Final  . MCHC 01/29/2015 33.8  32.0 - 36.0 g/dL Final  . RDW 01/29/2015 13.8  11.5 - 14.5 % Final  . Platelets 01/29/2015 232  150 - 440 K/uL Final  . Neutrophils Relative % 01/29/2015 58   Final  . Neutro Abs 01/29/2015 1.9  1.4 - 6.5 K/uL Final  . Lymphocytes Relative 01/29/2015 32   Final  . Lymphs Abs 01/29/2015 1.1  1.0 - 3.6 K/uL Final  . Monocytes Relative 01/29/2015 7   Final  . Monocytes Absolute 01/29/2015 0.2  0.2 - 0.9 K/uL Final  . Eosinophils Relative 01/29/2015 2   Final  . Eosinophils Absolute 01/29/2015 0.1  0 - 0.7 K/uL Final  . Basophils Relative 01/29/2015 1   Final  . Basophils Absolute 01/29/2015 0.0  0 - 0.1 K/uL Final  . Sodium 01/29/2015 139  135 - 145 mmol/L Final  . Potassium 01/29/2015 3.7  3.5 - 5.1 mmol/L Final  . Chloride 01/29/2015 106  101 - 111 mmol/L Final  . CO2 01/29/2015 29  22 - 32 mmol/L Final  . Glucose, Bld 01/29/2015 97  65 - 99 mg/dL Final  . BUN 01/29/2015 13  6 - 20 mg/dL Final  . Creatinine, Ser 01/29/2015 0.80  0.44 - 1.00 mg/dL Final  . Calcium 01/29/2015 9.0  8.9 - 10.3 mg/dL Final  . Total Protein 01/29/2015 6.3* 6.5 - 8.1 g/dL Final  . Albumin 01/29/2015 3.8  3.5 - 5.0 g/dL Final  . AST 01/29/2015 24  15 - 41 U/L Final  . ALT 01/29/2015 14  14 - 54 U/L Final  . Alkaline Phosphatase 01/29/2015 54  38 - 126 U/L Final  . Total Bilirubin 01/29/2015 0.4  0.3 - 1.2 mg/dL Final  . GFR calc non Af Amer 01/29/2015 >60  >60 mL/min Final  . GFR calc Af Amer 01/29/2015 >60  >60 mL/min Final   Comment: (NOTE) The eGFR has been calculated using the CKD EPI equation. This calculation has not been validated in all clinical situations. eGFR's persistently <60 mL/min signify possible Chronic Kidney Disease.   . Anion gap 01/29/2015 4* 5 - 15 Final  . Magnesium 01/29/2015 1.5* 1.7 - 2.4 mg/dL Final       ASSESSMENT: Gastrointestinal stromal tumor  status post resection with multiple postoperative complications ET scan has been reviewed there multiple lymph node which is increased in size.  CT scan has been reviewed with the patient.  . At this point in time I do not believe that we need to change any therapy but will try to increase dose gradually to 400 mg I had prolonged discussion with patient to start 400 mg Monday Wednesday Friday and and remaining days 300 mg.  If tolerated proceed with 400 mg every day   MEDICAL DECISION MAKING:  All lab data has been reviewed.  she  has mild anemia. So has hypokalemia Family was here to discuss the result of the CT scan which has been reviewed with the patient.  Repeat electrolytes and CBC in 6 weeks for reevaluation if patient tolerates 400 mg  daily then repeat PET scan or CT scan in 3 months or before if patient develops any symptoms   Patient expressed understanding and was in agreement with this plan. She also understands that She can call clinic at any time with any questions, concerns, or complaints.    No matching staging information was found for the patient.  Forest Gleason, MD   01/29/2015 9:16 AM

## 2015-02-04 ENCOUNTER — Other Ambulatory Visit: Payer: Medicare PPO

## 2015-02-04 ENCOUNTER — Ambulatory Visit: Payer: Medicare PPO | Admitting: Oncology

## 2015-02-07 ENCOUNTER — Encounter: Payer: Self-pay | Admitting: *Deleted

## 2015-02-07 NOTE — OR Nursing (Signed)
Cleared by Dr Jeb Levering

## 2015-02-11 ENCOUNTER — Ambulatory Visit
Admission: RE | Admit: 2015-02-11 | Discharge: 2015-02-11 | Disposition: A | Discharge status: Home / Self Care | Payer: Medicare PPO | Source: Ambulatory Visit | Attending: Ophthalmology | Admitting: Ophthalmology

## 2015-02-11 ENCOUNTER — Encounter
Admission: RE | Disposition: A | Discharge status: Home / Self Care | Payer: Self-pay | Source: Ambulatory Visit | Attending: Ophthalmology

## 2015-02-11 ENCOUNTER — Ambulatory Visit: Payer: Medicare PPO | Admitting: Registered Nurse

## 2015-02-11 ENCOUNTER — Encounter: Payer: Self-pay | Admitting: *Deleted

## 2015-02-11 DIAGNOSIS — M1990 Unspecified osteoarthritis, unspecified site: Secondary | ICD-10-CM | POA: Diagnosis not present

## 2015-02-11 DIAGNOSIS — K449 Diaphragmatic hernia without obstruction or gangrene: Secondary | ICD-10-CM | POA: Diagnosis not present

## 2015-02-11 DIAGNOSIS — H2512 Age-related nuclear cataract, left eye: Secondary | ICD-10-CM | POA: Insufficient documentation

## 2015-02-11 DIAGNOSIS — Z9841 Cataract extraction status, right eye: Secondary | ICD-10-CM | POA: Diagnosis not present

## 2015-02-11 DIAGNOSIS — H269 Unspecified cataract: Secondary | ICD-10-CM | POA: Diagnosis present

## 2015-02-11 DIAGNOSIS — Z85028 Personal history of other malignant neoplasm of stomach: Secondary | ICD-10-CM | POA: Insufficient documentation

## 2015-02-11 DIAGNOSIS — H25012 Cortical age-related cataract, left eye: Secondary | ICD-10-CM | POA: Diagnosis not present

## 2015-02-11 DIAGNOSIS — R251 Tremor, unspecified: Secondary | ICD-10-CM | POA: Diagnosis not present

## 2015-02-11 DIAGNOSIS — Z880 Allergy status to penicillin: Secondary | ICD-10-CM | POA: Diagnosis not present

## 2015-02-11 DIAGNOSIS — Z90710 Acquired absence of both cervix and uterus: Secondary | ICD-10-CM | POA: Insufficient documentation

## 2015-02-11 DIAGNOSIS — K219 Gastro-esophageal reflux disease without esophagitis: Secondary | ICD-10-CM | POA: Diagnosis not present

## 2015-02-11 HISTORY — DX: Gastrointestinal stromal tumor of large intestine: C49.A4

## 2015-02-11 HISTORY — PX: CATARACT EXTRACTION W/PHACO: SHX586

## 2015-02-11 SURGERY — PHACOEMULSIFICATION, CATARACT, WITH IOL INSERTION
Anesthesia: Monitor Anesthesia Care | Site: Eye | Laterality: Left | Wound class: Clean

## 2015-02-11 MED ORDER — CEFUROXIME OPHTHALMIC INJECTION 1 MG/0.1 ML
INJECTION | OPHTHALMIC | Status: AC
  Filled 2015-02-11: qty 0.1

## 2015-02-11 MED ORDER — LIDOCAINE HCL (PF) 4 % IJ SOLN
INTRAMUSCULAR | Status: AC
  Filled 2015-02-11: qty 10

## 2015-02-11 MED ORDER — ALFENTANIL 500 MCG/ML IJ INJ
INJECTION | INTRAMUSCULAR | Status: DC | PRN
  Administered 2015-02-11: 500 ug via INTRAVENOUS

## 2015-02-11 MED ORDER — SODIUM CHLORIDE 0.9 % IV SOLN
INTRAVENOUS | Status: DC
  Administered 2015-02-11: 08:00:00 via INTRAVENOUS

## 2015-02-11 MED ORDER — MOXIFLOXACIN HCL 0.5 % OP SOLN
OPHTHALMIC | Status: DC | PRN
  Administered 2015-02-11: 1 [drp] via OPHTHALMIC

## 2015-02-11 MED ORDER — NA CHONDROIT SULF-NA HYALURON 40-17 MG/ML IO SOLN
INTRAOCULAR | Status: AC
  Filled 2015-02-11: qty 1

## 2015-02-11 MED ORDER — TETRACAINE HCL 0.5 % OP SOLN
OPHTHALMIC | Status: AC
  Filled 2015-02-11: qty 2

## 2015-02-11 MED ORDER — HYALURONIDASE HUMAN 150 UNIT/ML IJ SOLN
INTRAMUSCULAR | Status: AC
  Filled 2015-02-11: qty 1

## 2015-02-11 MED ORDER — EPINEPHRINE HCL 1 MG/ML IJ SOLN
INTRAOCULAR | Status: DC | PRN
  Administered 2015-02-11: 1 mL via OPHTHALMIC

## 2015-02-11 MED ORDER — MOXIFLOXACIN HCL 0.5 % OP SOLN
1.0000 [drp] | OPHTHALMIC | Status: AC | PRN
Start: 2015-02-11 — End: 2015-02-11
  Administered 2015-02-11 (×3): 1 [drp] via OPHTHALMIC

## 2015-02-11 MED ORDER — CARBACHOL 0.01 % IO SOLN
INTRAOCULAR | Status: DC | PRN
  Administered 2015-02-11: .5 mL via INTRAOCULAR

## 2015-02-11 MED ORDER — CYCLOPENTOLATE HCL 2 % OP SOLN
1.0000 [drp] | OPHTHALMIC | Status: DC | PRN
  Administered 2015-02-11 (×4): 1 [drp] via OPHTHALMIC

## 2015-02-11 MED ORDER — EPINEPHRINE HCL 1 MG/ML IJ SOLN
INTRAMUSCULAR | Status: AC
  Filled 2015-02-11: qty 2

## 2015-02-11 MED ORDER — ONDANSETRON HCL 4 MG/2ML IJ SOLN
INTRAMUSCULAR | Status: DC | PRN
  Administered 2015-02-11: 4 mg via INTRAVENOUS

## 2015-02-11 MED ORDER — MIDAZOLAM HCL 2 MG/2ML IJ SOLN
INTRAMUSCULAR | Status: DC | PRN
  Administered 2015-02-11: 1 mg via INTRAVENOUS

## 2015-02-11 MED ORDER — TETRACAINE HCL 0.5 % OP SOLN
OPHTHALMIC | Status: DC | PRN
  Administered 2015-02-11: 1 [drp] via OPHTHALMIC

## 2015-02-11 MED ORDER — PHENYLEPHRINE HCL 10 % OP SOLN
1.0000 [drp] | OPHTHALMIC | Status: AC | PRN
  Administered 2015-02-11 (×4): 1 [drp] via OPHTHALMIC

## 2015-02-11 MED ORDER — NA CHONDROIT SULF-NA HYALURON 40-17 MG/ML IO SOLN
INTRAOCULAR | Status: DC | PRN
  Administered 2015-02-11: 1 mL via INTRAOCULAR

## 2015-02-11 MED ORDER — LIDOCAINE HCL (PF) 4 % IJ SOLN
INTRAMUSCULAR | Status: DC | PRN
  Administered 2015-02-11: 4 mL via OPHTHALMIC

## 2015-02-11 MED ORDER — LIDOCAINE HCL (PF) 4 % IJ SOLN
INTRAMUSCULAR | Status: DC | PRN
  Administered 2015-02-11: .1 mL via OPHTHALMIC

## 2015-02-11 MED ORDER — BUPIVACAINE HCL (PF) 0.75 % IJ SOLN
INTRAMUSCULAR | Status: AC
  Filled 2015-02-11: qty 10

## 2015-02-11 SURGICAL SUPPLY — 29 items

## 2015-02-11 NOTE — Anesthesia Preprocedure Evaluation (Signed)
Anesthesia Evaluation  Patient identified by MRN, date of birth, ID band Patient awake    Reviewed: Allergy & Precautions, NPO status , Patient's Chart, lab work & pertinent test results, reviewed documented beta blocker date and time   Airway Mallampati: II       Dental  (+) Upper Dentures   Pulmonary neg pulmonary ROS,    Pulmonary exam normal breath sounds clear to auscultation       Cardiovascular Exercise Tolerance: Good hypertension, Pt. on medications and Pt. on home beta blockers Normal cardiovascular exam     Neuro/Psych    GI/Hepatic Neg liver ROS, hiatal hernia, GERD  ,  Endo/Other  negative endocrine ROS  Renal/GU negative Renal ROS     Musculoskeletal   Abdominal Normal abdominal exam  (+)   Peds  Hematology negative hematology ROS (+)   Anesthesia Other Findings   Reproductive/Obstetrics                             Anesthesia Physical Anesthesia Plan  ASA: III  Anesthesia Plan: MAC   Post-op Pain Management:    Induction: Intravenous  Airway Management Planned: Nasal Cannula  Additional Equipment:   Intra-op Plan:   Post-operative Plan:   Informed Consent: I have reviewed the patients History and Physical, chart, labs and discussed the procedure including the risks, benefits and alternatives for the proposed anesthesia with the patient or authorized representative who has indicated his/her understanding and acceptance.     Plan Discussed with: CRNA  Anesthesia Plan Comments:         Anesthesia Quick Evaluation

## 2015-02-11 NOTE — H&P (Signed)
See scanned note.

## 2015-02-11 NOTE — Op Note (Signed)
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 Item Serial No. Manufacturer Lot No. LRB No. Used  LENS IOL ACRYSOF IQ 22.0 - [id number] Intraocular Lens LENS IOL ACRYSOF IQ 22.0 [id number] ALCON [id number] Left 1   Anesthesia: 2% Lidocaine and 4% Marcaine in a 50/50 mixture with 10 unites/ml of Hylenex given as a peribulbar Anesthesiologist: Anesthesiologist: Gijsbertus F Boston Service, MD CRNA: Demetrius Charity, CRNA; Doreen Salvage, CRNA Complications: none Estimated Blood Loss: less than 1 ml  Description of procedure:  The patient was given anesthesia and sedation via intravenous access. The patient was then prepped and draped in the usual fashion. A 25-gauge needle was bent for initiating the capsulorhexis. A 5-0 silk suture was placed through the conjunctiva superior and inferiorly to serve as bridle sutures. Hemostasis was obtained at the superior limbus using an eraser cautery. A partial thickness groove was made at the anterior surgical limbus with a 64 Beaver blade and this was dissected anteriorly with an [employer]aya. The anterior chamber was entered at 10 o'clock with a 1.0 mm paracentesis knife and through the lamellar dissection with a 2.6 mm Alcon keratome. Epi-Shugarcaine 0.5 CC [9 cc BSS Plus (Alcon), 3 cc 4% preservative-free lidocaine (Hospira) and 4 cc 1:1000 preservative-free, bisulfite-free epinephrine] was injected into the anterior chamber via the paracentesis tract. Epi-Shugarcaine 0.5 CC [9 cc BSS Plus (Alcon), 3 cc 4% preservative-free lidocaine (Hospira) and 4 cc 1:1000 preservative-free, bisulfite-free epinephrine] was injected into the anterior chamber via the  paracentesis tract. DiscoVisc was injected to replace the aqueous and a continuous tear curvilinear capsulorhexis was performed using a bent 25-gauge needle.  Balance salt on a syringe was used to perform hydro-dissection and phacoemulsification was carried out using a divide and conquer technique. Procedure(s) with comments: CATARACT EXTRACTION PHACO AND INTRAOCULAR LENS PLACEMENT (IOC) (Left) - [name] 01:18 AP% 22.8 CDE 32.38  fluid pack lot # [id number]. Irrigation/aspiration was used to remove the residual cortex and the capsular bag was inflated with DiscoVisc. The intraocular lens was inserted into the capsular bag using a pre-loaded UltraSert Delivery System. Irrigation/aspiration was used to remove the residual DiscoVisc. The wound was inflated with balanced salt and checked for leaks. None were found. Miostat was injected via the paracentesis track and 0.1 ml of Vigamox containing 1 mg of drug  was injected via the paracentesis track. The wound was checked for leaks again and none were found.   The bridal sutures were removed and two drops of Vigamox were placed on the eye. An eye shield was placed to protect the eye and the patient was discharged to the recovery area in good condition.   Zell Doucette MD

## 2015-02-11 NOTE — Anesthesia Postprocedure Evaluation (Signed)
  Anesthesia Post-op Note  Patient: Courtney [name] [place]  Procedure(s) Performed: Procedure(s) with comments: CATARACT EXTRACTION PHACO AND INTRAOCULAR LENS PLACEMENT (IOC) (Left) - [name] 01:18 AP% 22.8 CDE 32.38  fluid pack lot # [id number]  Anesthesia type:MAC  Patient location: PACU  Post pain: Pain level controlled  Post assessment: Post-op Vital signs reviewed, Patient's Cardiovascular Status Stable, Respiratory Function Stable, Patent Airway and No signs of Nausea or vomiting  Post vital signs: Reviewed and stable  Last Vitals:  Filed Vitals:   02/11/15 0902 02/11/15 0904  BP: 138/58 138/58  Pulse: 70 72  Temp: 36.5 C 36.5 C  Resp: 16 18    Level of consciousness: awake, alert  and patient cooperative  Complications: No apparent anesthesia complications

## 2015-02-11 NOTE — Discharge Instructions (Signed)
AMBULATORY SURGERY  DISCHARGE INSTRUCTIONS   1) The drugs that you were given will stay in your system until tomorrow so for the next 24 hours you should not:  A) Drive an automobile B) Make any legal decisions C) Drink any alcoholic beverage   2) You may resume regular meals tomorrow.  Today it is better to start with liquids and gradually work up to solid foods.  You may eat anything you prefer, but it is better to start with liquids, then soup and crackers, and gradually work up to solid foods.   3) Please notify your doctor immediately if you have any unusual bleeding, trouble breathing, redness and pain at the surgery site, drainage, fever, or pain not relieved by medication.    4) Additional Instructions:   Eye Surgery Discharge Instructions  Expect mild scratchy sensation or mild soreness. DO NOT RUB YOUR EYE!  The day of surgery:  Minimal physical activity, but bed rest is not required  No reading, computer work, or close hand work  No bending, lifting, or straining.  May watch TV  For 24 hours:  No driving, legal decisions, or alcoholic beverages  Safety precautions  Eat anything you prefer: It is better to start with liquids, then soup then solid foods.  _____ Eye patch should be worn until postoperative exam tomorrow.  ____ Solar shield eyeglasses should be worn for comfort in the sunlight/patch while sleeping  Resume all regular medications including aspirin or Coumadin if these were discontinued prior to surgery. You may shower, bathe, shave, or wash your hair. Tylenol may be taken for mild discomfort.  Call your doctor if you experience significant pain, nausea, or vomiting, fever > 101 or other signs of infection. [phone] or [phone] Specific instructions:  Follow-up Information    Follow up with Courtney Hangartner, MD In 1 day.   Specialty:  Ophthalmology   Why:  November 22 at 10:10am   Contact information:   [address]   [place] [address] [phone]          Please contact your physician with any problems or Same Day Surgery at [phone], Monday through Friday 6 am to 4 pm, or [place] at [facility] number at [phone].

## 2015-02-11 NOTE — Anesthesia Procedure Notes (Signed)
Procedure Name: MAC Date/Time: 02/11/2015 8:26 AM Performed by: Doreen Salvage Pre-anesthesia Checklist: Patient identified, Emergency Drugs available, Suction available and Patient being monitored Patient Re-evaluated:Patient Re-evaluated prior to inductionOxygen Delivery Method: Nasal cannula

## 2015-02-11 NOTE — Interval H&P Note (Signed)
History and Physical Interval Note:  02/11/2015 7:28 AM  Courtney [name]  has presented today for surgery, with the diagnosis of CATARACT  The various methods of treatment have been discussed with the patient and family. After consideration of risks, benefits and other options for treatment, the patient has consented to  Procedure(s): CATARACT EXTRACTION PHACO AND INTRAOCULAR LENS PLACEMENT ([place]) (Left) as a surgical intervention .  The patient's history has been reviewed, patient examined, no change in status, stable for surgery.  I have reviewed the patient's chart and labs.  Questions were answered to the patient's satisfaction.     Franki Stemen

## 2015-02-11 NOTE — Transfer of Care (Signed)
Immediate Anesthesia Transfer of Care Note  Patient: Courtney [name]  Procedure(s) Performed: Procedure(s) with comments: CATARACT EXTRACTION PHACO AND INTRAOCULAR LENS PLACEMENT (IOC) (Left) - [name] 01:18 AP% 22.8 CDE 32.38  fluid pack lot # [id number]  Patient Location: PACU  Anesthesia Type:MAC  Level of Consciousness: awake, alert  and oriented  Airway & Oxygen Therapy: Patient Spontanous Breathing  Post-op Assessment: Report given to RN and Post -op Vital signs reviewed and stable  Post vital signs: Reviewed and stable  Last Vitals:  Filed Vitals:   02/11/15 0902 02/11/15 0904  BP: 138/58 138/58  Pulse: 70 72  Temp: 36.5 C 36.5 C  Resp: 16 18    Complications: No apparent anesthesia complications

## 2015-02-21 ENCOUNTER — Telehealth: Payer: Self-pay | Admitting: *Deleted

## 2015-02-21 ENCOUNTER — Other Ambulatory Visit: Payer: Self-pay | Admitting: Oncology

## 2015-02-21 NOTE — Telephone Encounter (Signed)
Pt states is having left foot and top of left leg swelling that started 1 week ago. Per Dr. Oliva Bustard, pt needs to elevated leg over night and call back tomorrow if not any better. Pt verbalized understanding.

## 2015-02-22 ENCOUNTER — Telehealth: Payer: Self-pay | Admitting: *Deleted

## 2015-02-22 DIAGNOSIS — C49A0 Gastrointestinal stromal tumor, unspecified site: Secondary | ICD-10-CM

## 2015-02-22 MED ORDER — FUROSEMIDE 20 MG PO TABS: 20.0000 mg | ORAL_TABLET | Freq: Every day | ORAL | Status: DC | PRN

## 2015-02-22 NOTE — Telephone Encounter (Signed)
Called to report less swelling in foot this morning, top of leg still swollen, she did not take fld pill, because she is out of it. Asking if she should use "white stocking". Per L Herring, AGNP-C she should use Ted hose and ok to refill furosemide. Notified patient of this

## 2015-02-25 ENCOUNTER — Other Ambulatory Visit: Payer: Self-pay | Admitting: Ophthalmology

## 2015-02-28 ENCOUNTER — Other Ambulatory Visit: Payer: Self-pay | Admitting: *Deleted

## 2015-02-28 ENCOUNTER — Ambulatory Visit: Payer: Medicare PPO | Admitting: Oncology

## 2015-02-28 ENCOUNTER — Other Ambulatory Visit: Payer: Medicare PPO

## 2015-03-10 IMAGING — CT CT ABD-PELV W/ CM
2 of 6 series · 15 of 46 positions shown, 17 images · IV contrast (omnipaque)
Comparison: PET-CT on 03/01/2014

CLINICAL DATA: Generalized abdominal pain. Followup GI stromal
tumor. Undergoing chemotherapy.

EXAM:
CT ABDOMEN AND PELVIS WITH CONTRAST
TECHNIQUE: Multidetector CT imaging of the abdomen and pelvis was performed
using the standard protocol following bolus administration of
intravenous contrast.
CONTRAST:  100 mL Omnipaque 300

[Series 2: routine with · axial · 0.64mm/px · z∈[-1111,-696]mm · 12 of 95 slices shown, 14 images]
[im 6/95  soft-tissue]
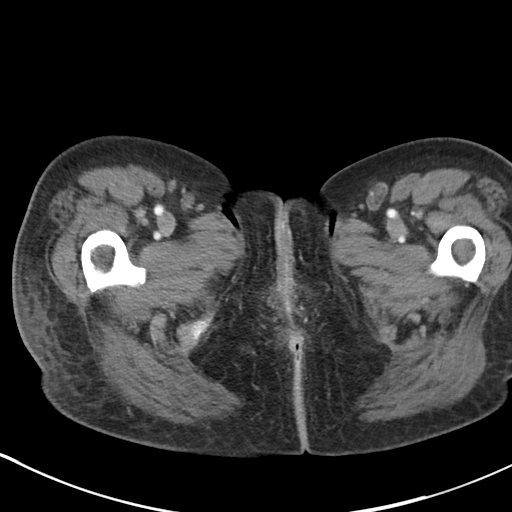
[im 6/95  bone]
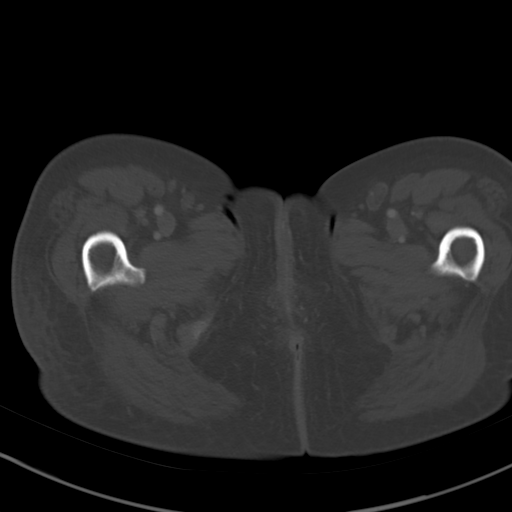
[im 12/95  soft-tissue]
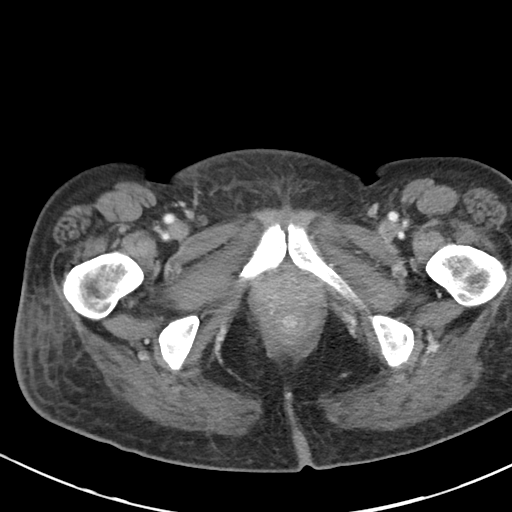
[im 24/95  soft-tissue]
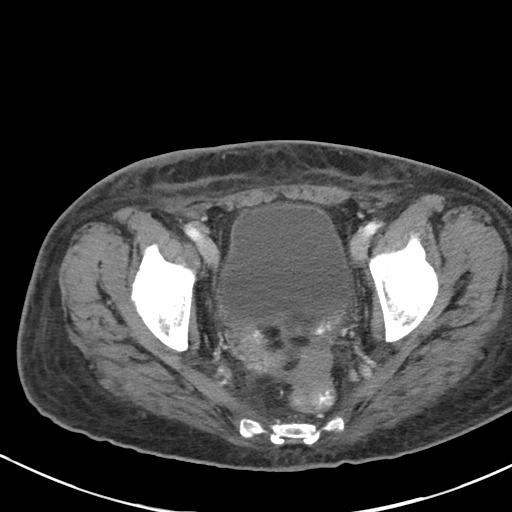
[im 30/95  soft-tissue]
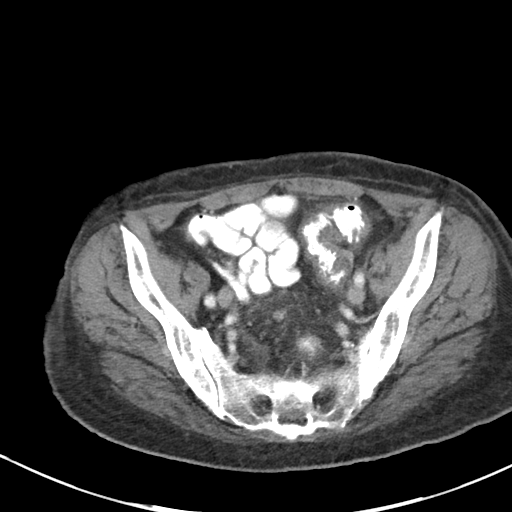
[im 36/95  soft-tissue]
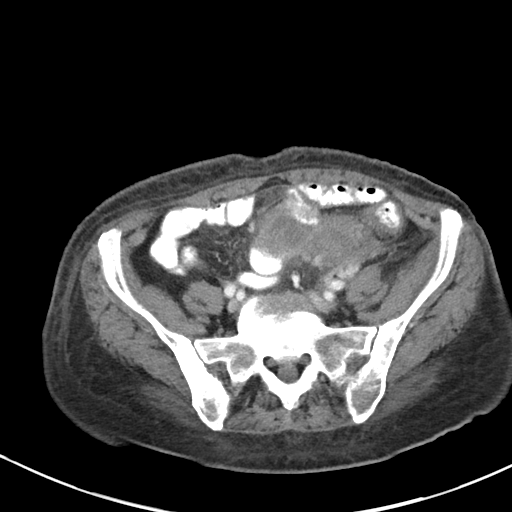
[im 42/95  soft-tissue]
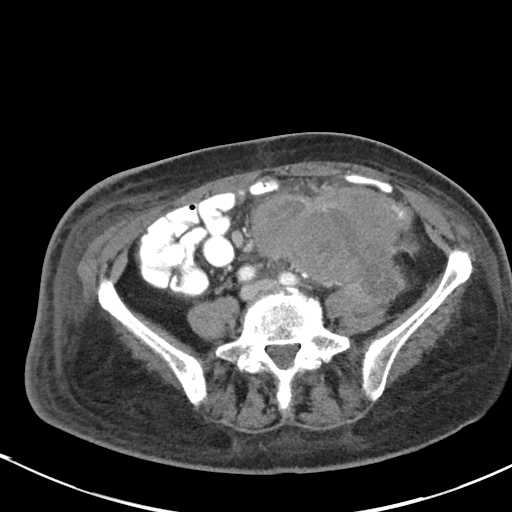
[im 53/95  soft-tissue]
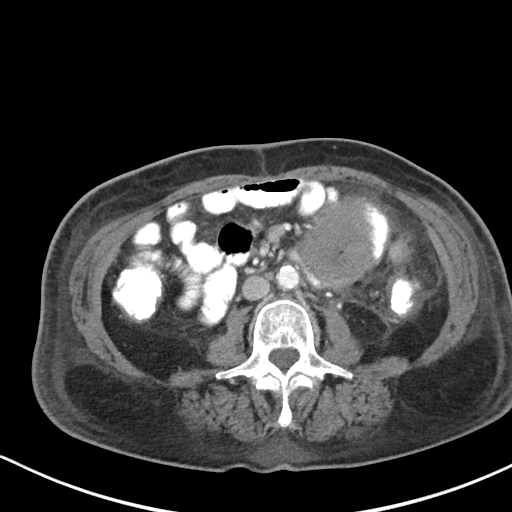
[im 59/95  soft-tissue]
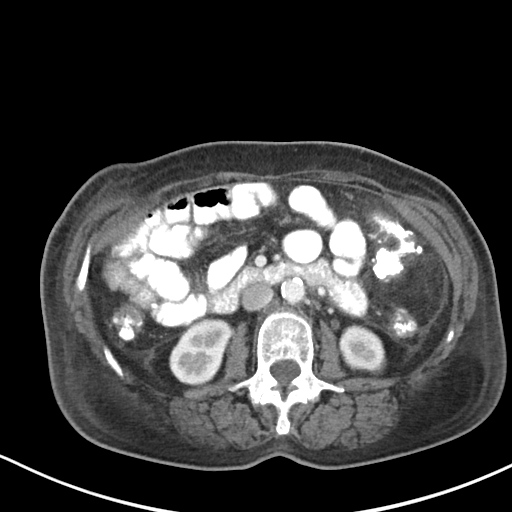
[im 65/95  soft-tissue]
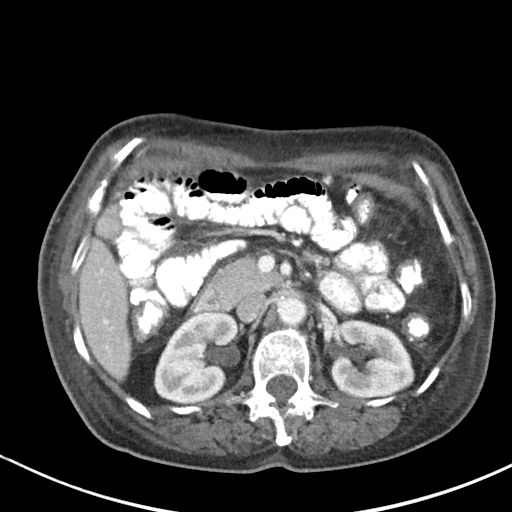
[im 65/95  bone]
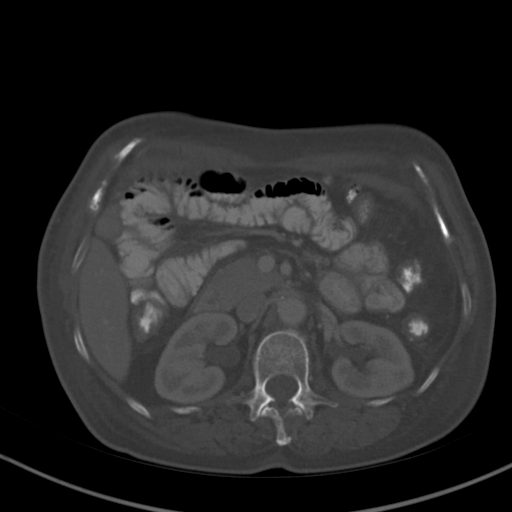
[im 71/95  soft-tissue]
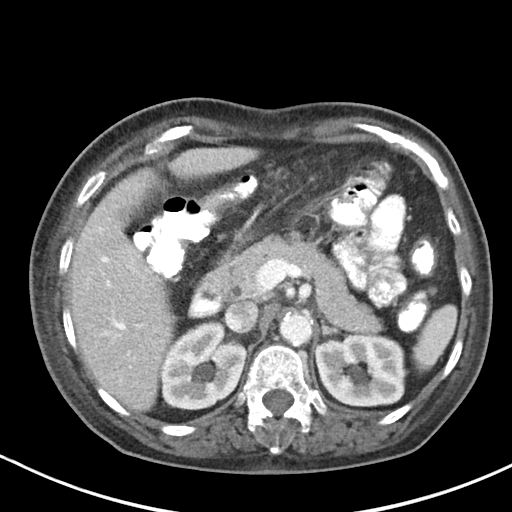
[im 83/95  soft-tissue]
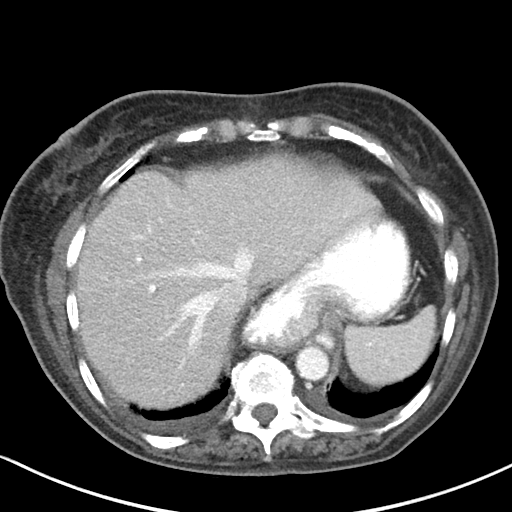
[im 89/95  soft-tissue]
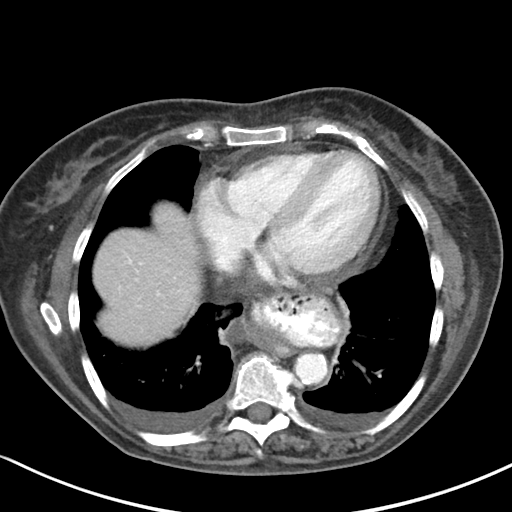

[Series 5: cor routine with · coronal · 0.65mm/px · 3 of 123 slices shown]
[im 41/123  soft-tissue]
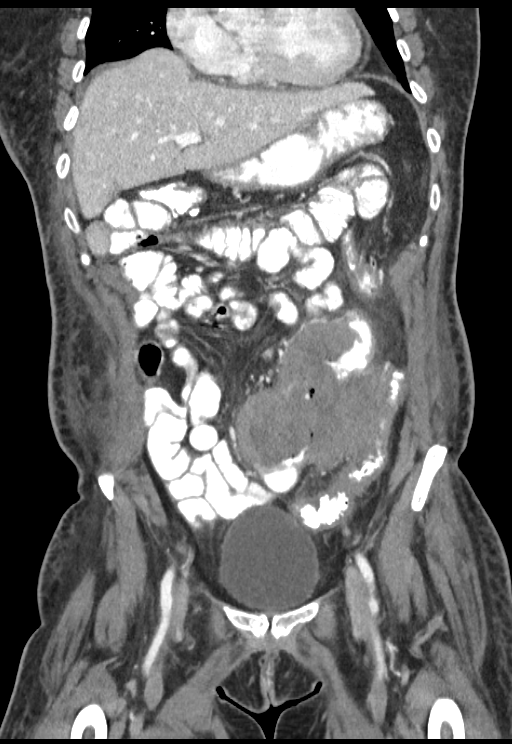
[im 55/123  soft-tissue]
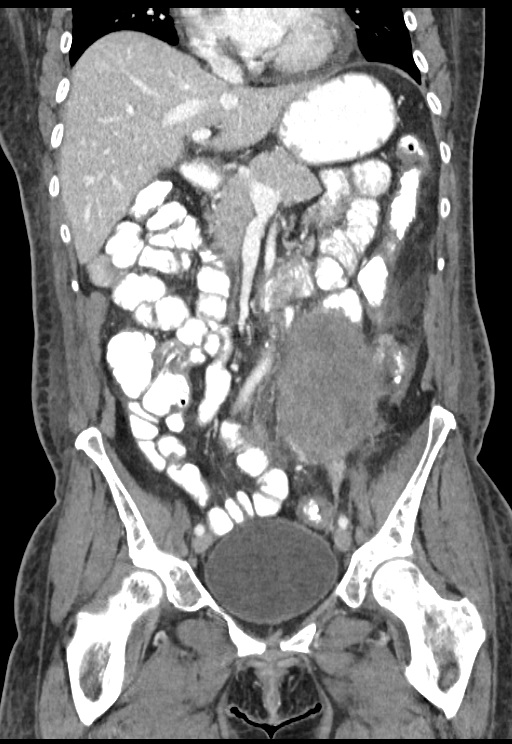
[im 68/123  soft-tissue]
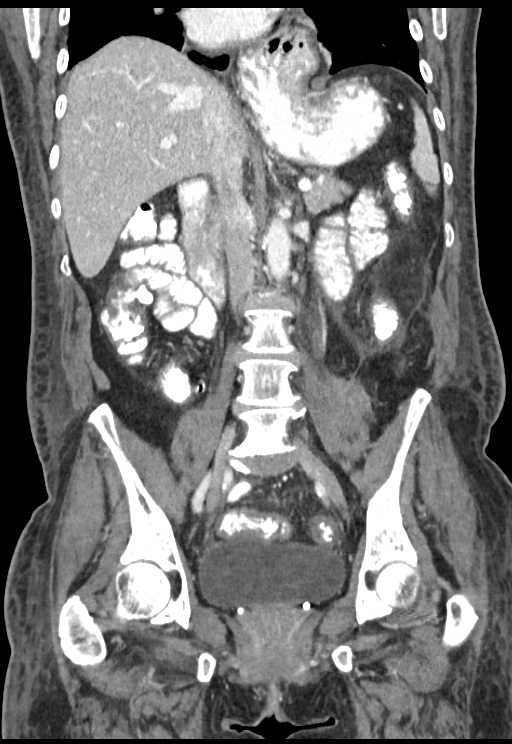

[15 of 46 positions shown; findings below may reference images not displayed]

FINDINGS: Lower Chest: Small pleural effusions and bibasilar atelectasis show
improvement since previous study. Moderate to large hiatal hernia
again seen.

Hepatobiliary: No masses or other significant abnormality
identified. Prior cholecystectomy noted. No evidence of biliary
dilatation.

Pancreas: No mass, inflammatory changes, or other significant
abnormality identified.

Spleen:  Within normal limits in size and appearance.

Adrenals:  No masses identified.

Kidneys/Urinary Tract:  No evidence of masses or hydronephrosis.

Stomach/Bowel/Peritoneum: A large soft tissue mass is again seen in
the left lower quadrant involving small bowel and adjacent
mesentery. This mass measures 6.8 x 10.9 cm on image 55/series 2
compared to 7.6 x 10.3 cm previously. No evidence of bowel
obstruction.

Another soft tissue mass is seen involving the rectum within the
pelvic cul-de-sac, which measures 2.8 x 3.5 cm on image 73/series 2.
This shows no significant change compared to previous study. A
cm peritoneal nodule is seen in the right posterior pelvis measuring
1.7 cm on image 68/series 2 compared to 1.8 cm previously. There is
no evidence of bowel obstruction.

Vascular/Lymphatic: Small less than 1 cm lymph nodes in the central
small bowel mesentery along the right lateral margin of the large
mass remains stable.

Reproductive:  Prior hysterectomy noted.

Other:  None.

Musculoskeletal:  No suspicious bone lesions identified.
IMPRESSION: No significant change in large left lower quadrant soft tissue mass
involving small bowel and mesentery.

Stable small peritoneal masses in the right posterior pelvis and
pelvic cul-de-sac involving rectum.

Stable small less than 1 cm mesenteric lymph nodes.

No new or progressive disease identified within the abdomen or
pelvis.

Decreased small bilateral pleural effusions and bibasilar
atelectasis.

## 2015-03-12 ENCOUNTER — Inpatient Hospital Stay: Payer: Medicare PPO | Attending: Oncology | Admitting: Oncology

## 2015-03-12 ENCOUNTER — Ambulatory Visit
Admission: RE | Admit: 2015-03-12 | Discharge: 2015-03-12 | Disposition: A | Discharge status: Home / Self Care | Payer: Medicare PPO | Source: Ambulatory Visit | Attending: Oncology | Admitting: Oncology

## 2015-03-12 ENCOUNTER — Encounter: Payer: Self-pay | Admitting: Oncology

## 2015-03-12 ENCOUNTER — Inpatient Hospital Stay: Payer: Medicare PPO

## 2015-03-12 VITALS — BP 156/75 | HR 87 | Temp 95.3°F | Resp 18 | Wt 122.8 lb

## 2015-03-12 DIAGNOSIS — C49A0 Gastrointestinal stromal tumor, unspecified site: Secondary | ICD-10-CM | POA: Diagnosis not present

## 2015-03-12 DIAGNOSIS — I82402 Acute embolism and thrombosis of unspecified deep veins of left lower extremity: Secondary | ICD-10-CM | POA: Insufficient documentation

## 2015-03-12 DIAGNOSIS — K219 Gastro-esophageal reflux disease without esophagitis: Secondary | ICD-10-CM | POA: Insufficient documentation

## 2015-03-12 DIAGNOSIS — M7989 Other specified soft tissue disorders: Secondary | ICD-10-CM | POA: Diagnosis present

## 2015-03-12 DIAGNOSIS — Z7901 Long term (current) use of anticoagulants: Secondary | ICD-10-CM | POA: Diagnosis not present

## 2015-03-12 DIAGNOSIS — R600 Localized edema: Secondary | ICD-10-CM | POA: Diagnosis not present

## 2015-03-12 DIAGNOSIS — D649 Anemia, unspecified: Secondary | ICD-10-CM | POA: Insufficient documentation

## 2015-03-12 DIAGNOSIS — M1990 Unspecified osteoarthritis, unspecified site: Secondary | ICD-10-CM | POA: Diagnosis not present

## 2015-03-12 DIAGNOSIS — Z79899 Other long term (current) drug therapy: Secondary | ICD-10-CM | POA: Insufficient documentation

## 2015-03-12 DIAGNOSIS — K649 Unspecified hemorrhoids: Secondary | ICD-10-CM | POA: Diagnosis not present

## 2015-03-12 LAB — COMPREHENSIVE METABOLIC PANEL
ALK PHOS: 50 U/L (ref 38–126)
ALT: 14 U/L (ref 14–54)
ANION GAP: 6 (ref 5–15)
AST: 26 U/L (ref 15–41)
Albumin: 3.9 g/dL (ref 3.5–5.0)
BUN: 15 mg/dL (ref 6–20)
CALCIUM: 8.7 mg/dL — AB (ref 8.9–10.3)
CO2: 27 mmol/L (ref 22–32)
Chloride: 104 mmol/L (ref 101–111)
Creatinine, Ser: 0.79 mg/dL (ref 0.44–1.00)
Glucose, Bld: 117 mg/dL — ABNORMAL HIGH (ref 65–99)
Potassium: 3.7 mmol/L (ref 3.5–5.1)
SODIUM: 137 mmol/L (ref 135–145)
Total Bilirubin: 0.2 mg/dL — ABNORMAL LOW (ref 0.3–1.2)
Total Protein: 6.2 g/dL — ABNORMAL LOW (ref 6.5–8.1)

## 2015-03-12 LAB — CBC WITH DIFFERENTIAL/PLATELET
Basophils Absolute: 0 10*3/uL (ref 0–0.1)
Basophils Relative: 1 %
EOS ABS: 0.1 10*3/uL (ref 0–0.7)
EOS PCT: 2 %
HCT: 26.1 % — ABNORMAL LOW (ref 35.0–47.0)
HEMOGLOBIN: 8.7 g/dL — AB (ref 12.0–16.0)
LYMPHS ABS: 1.1 10*3/uL (ref 1.0–3.6)
Lymphocytes Relative: 31 %
MCH: 31.8 pg (ref 26.0–34.0)
MCHC: 33.3 g/dL (ref 32.0–36.0)
MCV: 95.7 fL (ref 80.0–100.0)
MONOS PCT: 7 %
Monocytes Absolute: 0.2 10*3/uL (ref 0.2–0.9)
Neutro Abs: 2.1 10*3/uL (ref 1.4–6.5)
Neutrophils Relative %: 59 %
PLATELETS: 213 10*3/uL (ref 150–440)
RBC: 2.72 MIL/uL — ABNORMAL LOW (ref 3.80–5.20)
RDW: 14.6 % — ABNORMAL HIGH (ref 11.5–14.5)
WBC: 3.5 10*3/uL — ABNORMAL LOW (ref 3.6–11.0)

## 2015-03-12 NOTE — Progress Notes (Signed)
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  Patient has been started on [place] therapy from June of 2016 Presently tolerating only 300 mg daily tablets.  INTERVAL HISTORY:  74 year old lady with a history of gastrointestinal stromal tumor  1.patient  is on daily VAC 300 alternating with 400 mg and tolerating very well 2.Continues to a problem with hemorrhoids with occasional bleeding.  But refuses to go and  See surgeon. 3.Swelling of the left lower extremity which she says is chronic but was not seen in the past   Review of systems: Gen. status patient is feeling somewhat stronger.  Appetite is improved.  No abdominal pain. HEENT no soreness in the mouth. GI ;  Continues to have problem with hemorrhoids with occasional bleeding swelling and pain patient is doing sitz bath.  She does not want to go   And see  surgeon. Cardiovascular system: No chest pain.  No palpitation.  No paroxysmal nocturnal dyspnea. Neurological system: No dizziness.  No tingling.  His was.  no tingling numbness.  no focal weakness or any focal signs. Skin: No evidence of ecchymosis or rash. Lower extremity:swelling of the left lower extremity As per HPI. Otherwise, a complete review of systems is negatve.  PAST MEDICAL HISTORY: Diverticulitis. Degenerative arthritis disease Gastroesophageal reflux disease  PAST SURGICAL HISTORY: Recent surgery for gastrointestinal stromal tumor followed by diverting colostomy because of complications now colostomy had been reversed. FAMILY HISTORY There is no significant family history of breast cancer, ovarian cancer, colon cancer ADVANCED DIRECTIVES:  She  does have a living will  HEALTH MAINTENANCE: Social History  Substance Use Topics  . Smoking status: Never Smoker   . Smokeless tobacco: None  . Alcohol Use: No      Allergies  Allergen Reactions  . Penicillins Swelling    HIVES     OBJECTIVE:  Filed Vitals:   03/12/15 1020  BP: 156/75  Pulse: 87  Temp: 95.3 F (35.2 C)  Resp: 18     Body mass index is 24.79 kg/(m^2).    ECOG FS:1 - Symptomatic but completely ambulatory  PHYSICAL EXAM: Gen. status: Patient is alert and oriented not in acute distress Head exam was generally normal. There was no scleral icterus or corneal arcus. Mucous membranes were moist. Periorbital edema Examination of the chest was unremarkable. There were no bony deformities, no asymmetry, and no other abnormalities. Cardiac exam revealed the PMI to be normally situated and sized. The rhythm was regular and no extrasystoles were noted during several minutes of auscultation. The first and second heart sounds were normal and physiologic splitting of the second heart sound was noted. There were no murmurs, rubs, clicks, or gallops. Abdomen: Soft.  Liver and spleen not palpable.  No ascites. Lower extremity     : Swelling of the left lower extremity        ;  Examination of the skin revealed no evidence of significant rashes, suspicious appearing nevi or other concerning lesions. Neurologically, the patient was awake, alert, and oriented to person, place and time. There were no obvious focal neurologic abnormalities.   LAB RESULTS:  Appointment on 03/12/2015  Component Date Value Ref Range Status  . WBC  03/12/2015 3.5* 3.6 - 11.0 K/uL Final  . RBC 03/12/2015 2.72* 3.80 - 5.20 MIL/uL Final  . Hemoglobin 03/12/2015 8.7* 12.0 - 16.0 g/dL Final  . HCT 03/12/2015 26.1* 35.0 - 47.0 % Final  . MCV 03/12/2015 95.7  80.0 - 100.0 fL Final  . MCH 03/12/2015 31.8  26.0 - 34.0 pg Final  . MCHC 03/12/2015 33.3  32.0 - 36.0 g/dL Final  . RDW 03/12/2015 14.6* 11.5  - 14.5 % Final  . Platelets 03/12/2015 213  150 - 440 K/uL Final  . Neutrophils Relative % 03/12/2015 59   Final  . Neutro Abs 03/12/2015 2.1  1.4 - 6.5 K/uL Final  . Lymphocytes Relative 03/12/2015 31   Final  . Lymphs Abs 03/12/2015 1.1  1.0 - 3.6 K/uL Final  . Monocytes Relative 03/12/2015 7   Final  . Monocytes Absolute 03/12/2015 0.2  0.2 - 0.9 K/uL Final  . Eosinophils Relative 03/12/2015 2   Final  . Eosinophils Absolute 03/12/2015 0.1  0 - 0.7 K/uL Final  . Basophils Relative 03/12/2015 1   Final  . Basophils Absolute 03/12/2015 0.0  0 - 0.1 K/uL Final  . Sodium 03/12/2015 137  135 - 145 mmol/L Final  . Potassium 03/12/2015 3.7  3.5 - 5.1 mmol/L Final  . Chloride 03/12/2015 104  101 - 111 mmol/L Final  . CO2 03/12/2015 27  22 - 32 mmol/L Final  . Glucose, Bld 03/12/2015 117* 65 - 99 mg/dL Final  . BUN 03/12/2015 15  6 - 20 mg/dL Final  . Creatinine, Ser 03/12/2015 0.79  0.44 - 1.00 mg/dL Final  . Calcium 03/12/2015 8.7* 8.9 - 10.3 mg/dL Final  . Total Protein 03/12/2015 6.2* 6.5 - 8.1 g/dL Final  . Albumin 03/12/2015 3.9  3.5 - 5.0 g/dL Final  . AST 03/12/2015 26  15 - 41 U/L Final  . ALT 03/12/2015 14  14 - 54 U/L Final  . Alkaline Phosphatase 03/12/2015 50  38 - 126 U/L Final  . Total Bilirubin 03/12/2015 0.2* 0.3 - 1.2 mg/dL Final  . GFR calc non Af Amer 03/12/2015 >60  >60 mL/min Final  . GFR calc Af Amer 03/12/2015 >60  >60 mL/min Final   Comment: (NOTE) The eGFR has been calculated using the CKD EPI equation. This calculation has not been validated in all clinical situations. eGFR's persistently <60 mL/min signify possible Chronic Kidney Disease.   . Anion gap 03/12/2015 6  5 - 15 Final       ASSESSMENT: Gastrointestinal stromal tumor status post resection with multiple postoperative complications Patient is tolerating Gleevec 300 mg alternating with 400 mg 2.  Swelling of the left lower extremity be ordered Doppler study today to rule out any deep vein  thrombosis 3.  Hemorrhoids patient refuses to see surgeon 4.  Anemia is most likely iron deficiency and associated with bleeding from hemorrhoids Will check iron studies patient will not take oral iron therapy If patient is iron deficient will do intravenous iron  Doppler study is positive for deep vein thrombosis patient will be started on anticoagulation which may make bleeding from hemorrhoids worse.   .vis   Patient expressed understanding and was in agreement with this plan. She also understands that She can call clinic at any time with any questions, concerns, or complaints.    No matching staging information was found for the patient.  Forest Gleason, MD   03/12/2015 10:30 AM

## 2015-03-12 NOTE — Progress Notes (Signed)
Patient states she feels good today.

## 2015-04-02 ENCOUNTER — Telehealth: Payer: Self-pay | Admitting: *Deleted

## 2015-04-02 MED ORDER — POTASSIUM CHLORIDE CRYS ER 20 MEQ PO TBCR: 20.0000 meq | EXTENDED_RELEASE_TABLET | Freq: Every day | ORAL | Status: DC

## 2015-04-02 NOTE — Telephone Encounter (Signed)
Escribed

## 2015-04-08 ENCOUNTER — Other Ambulatory Visit: Payer: Self-pay | Admitting: Internal Medicine

## 2015-04-08 DIAGNOSIS — Z1231 Encounter for screening mammogram for malignant neoplasm of breast: Secondary | ICD-10-CM

## 2015-04-09 ENCOUNTER — Encounter: Payer: Self-pay | Admitting: Oncology

## 2015-04-09 ENCOUNTER — Inpatient Hospital Stay (HOSPITAL_BASED_OUTPATIENT_CLINIC_OR_DEPARTMENT_OTHER): Payer: Medicare PPO | Admitting: Oncology

## 2015-04-09 ENCOUNTER — Inpatient Hospital Stay: Payer: Medicare PPO | Attending: Oncology

## 2015-04-09 VITALS — BP 136/75 | HR 88 | Temp 96.3°F | Resp 18 | Wt 123.0 lb

## 2015-04-09 DIAGNOSIS — C49A0 Gastrointestinal stromal tumor, unspecified site: Secondary | ICD-10-CM | POA: Diagnosis not present

## 2015-04-09 DIAGNOSIS — D649 Anemia, unspecified: Secondary | ICD-10-CM

## 2015-04-09 DIAGNOSIS — M1990 Unspecified osteoarthritis, unspecified site: Secondary | ICD-10-CM

## 2015-04-09 DIAGNOSIS — K219 Gastro-esophageal reflux disease without esophagitis: Secondary | ICD-10-CM | POA: Diagnosis not present

## 2015-04-09 DIAGNOSIS — K469 Unspecified abdominal hernia without obstruction or gangrene: Secondary | ICD-10-CM

## 2015-04-09 DIAGNOSIS — K649 Unspecified hemorrhoids: Secondary | ICD-10-CM | POA: Insufficient documentation

## 2015-04-09 DIAGNOSIS — R600 Localized edema: Secondary | ICD-10-CM

## 2015-04-09 LAB — COMPREHENSIVE METABOLIC PANEL
ALT: 14 U/L (ref 14–54)
AST: 23 U/L (ref 15–41)
Albumin: 3.8 g/dL (ref 3.5–5.0)
Alkaline Phosphatase: 50 U/L (ref 38–126)
Anion gap: 6 (ref 5–15)
BUN: 15 mg/dL (ref 6–20)
CHLORIDE: 102 mmol/L (ref 101–111)
CO2: 29 mmol/L (ref 22–32)
CREATININE: 0.87 mg/dL (ref 0.44–1.00)
Calcium: 9.2 mg/dL (ref 8.9–10.3)
GFR calc Af Amer: >60 mL/min (ref >60)
GFR calc non Af Amer: >60 mL/min (ref >60)
Glucose, Bld: 85 mg/dL (ref 65–99)
Potassium: 3.2 mmol/L — ABNORMAL LOW (ref 3.5–5.1)
SODIUM: 137 mmol/L (ref 135–145)
Total Bilirubin: 0.4 mg/dL (ref 0.3–1.2)
Total Protein: 6.4 g/dL — ABNORMAL LOW (ref 6.5–8.1)

## 2015-04-09 LAB — CBC WITH DIFFERENTIAL/PLATELET
Basophils Absolute: 0 10*3/uL (ref 0–0.1)
Basophils Relative: 1 %
EOS ABS: 0.1 10*3/uL (ref 0–0.7)
EOS PCT: 2 %
HCT: 29 % — ABNORMAL LOW (ref 35.0–47.0)
Hemoglobin: 9.6 g/dL — ABNORMAL LOW (ref 12.0–16.0)
Lymphocytes Relative: 27 %
Lymphs Abs: 0.9 10*3/uL — ABNORMAL LOW (ref 1.0–3.6)
MCH: 31.3 pg (ref 26.0–34.0)
MCHC: 33.2 g/dL (ref 32.0–36.0)
MCV: 94.3 fL (ref 80.0–100.0)
Monocytes Absolute: 0.3 10*3/uL (ref 0.2–0.9)
Monocytes Relative: 8 %
Neutro Abs: 2.2 10*3/uL (ref 1.4–6.5)
Neutrophils Relative %: 62 %
PLATELETS: 264 10*3/uL (ref 150–440)
RBC: 3.08 MIL/uL — AB (ref 3.80–5.20)
RDW: 14.9 % — ABNORMAL HIGH (ref 11.5–14.5)
WBC: 3.5 10*3/uL — AB (ref 3.6–11.0)

## 2015-04-09 LAB — IRON AND TIBC
IRON: 70 ug/dL (ref 28–170)
Saturation Ratios: 21 % (ref 10.4–31.8)
TIBC: 328 ug/dL (ref 250–450)
UIBC: 258 ug/dL

## 2015-04-09 LAB — FERRITIN: Ferritin: 11 ng/mL (ref 11–307)

## 2015-04-09 NOTE — Progress Notes (Signed)
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  Patient has been started on [place] therapy from June of 2016 Presently tolerating only 300 mg daily tablets.  INTERVAL HISTORY:  75 year old lady with a history of gastrointestinal stromal tumor  1.patient  is on daily VAC 300 alternating with 400 mg and tolerating very well 2.Continues to a problem with hemorrhoids with occasional bleeding.  But refuses to go and  See surgeon. 3.Swelling of the left lower extremity which she says is chronic but was not seen in the past In December the ultrasound was negative for any thrombosis   Review of systems: Gen. status patient is feeling somewhat stronger.  Appetite is improved.  No abdominal pain. HEENT no soreness in the mouth. GI ;  Continues to have problem with hemorrhoids with occasional bleeding swelling and pain patient is doing sitz bath.  She does not want to go   And see  surgeon. Cardiovascular system: No chest pain.  No palpitation.  No paroxysmal nocturnal dyspnea. Neurological system: No dizziness.  No tingling.  His was.  no tingling numbness.  no focal weakness or any focal signs. Skin: No evidence of ecchymosis or rash. Lower extremity:swelling of the left lower extremity As per HPI. Otherwise, a complete review of systems is negatve.  PAST MEDICAL HISTORY: Diverticulitis. Degenerative arthritis disease Gastroesophageal reflux disease  PAST SURGICAL HISTORY: Recent surgery for gastrointestinal stromal tumor followed by diverting colostomy because of complications now colostomy had been reversed. FAMILY HISTORY There is no significant family history of breast cancer,  ovarian cancer, colon cancer ADVANCED DIRECTIVES:  She does have a living will  HEALTH MAINTENANCE: Social History  Substance Use Topics  . Smoking status: Never Smoker   . Smokeless tobacco: None  . Alcohol Use: No      Allergies  Allergen Reactions  . Penicillins Swelling    HIVES     OBJECTIVE:  Filed Vitals:   04/09/15 1057  BP: 136/75  Pulse: 88  Temp: 96.3 F (35.7 C)  Resp: 18     Body mass index is 24.83 kg/(m^2).    ECOG FS:1 - Symptomatic but completely ambulatory  PHYSICAL EXAM: Gen. status: Patient is alert and oriented not in acute distress Head exam was generally normal. There was no scleral icterus or corneal arcus. Mucous membranes were moist. Periorbital edema Examination of the chest was unremarkable. There were no bony deformities, no asymmetry, and no other abnormalities. Cardiac exam revealed the PMI to be normally situated and sized. The rhythm was regular and no extrasystoles were noted during several minutes of auscultation. The first and second heart sounds were normal and physiologic splitting of the second heart sound was noted. There were no murmurs, rubs, clicks, or gallops. Abdomen: Soft.  Liver and spleen not palpable.  No ascites. Lower extremity     : Swelling of the left lower extremity        ;  Examination of the skin revealed no evidence of significant rashes, suspicious appearing nevi or other concerning lesions. Neurologically, the patient was awake, alert, and oriented to person, place and time. There were no obvious focal neurologic abnormalities.   LAB RESULTS:  Appointment on 04/09/2015  Component Date Value Ref Range Status  . WBC 04/09/2015 3.5* 3.6 - 11.0 K/uL Final  . RBC 04/09/2015 3.08* 3.80 - 5.20 MIL/uL Final  . Hemoglobin 04/09/2015 9.6* 12.0 - 16.0 g/dL Final  . HCT 04/09/2015 29.0* 35.0 - 47.0 % Final  . MCV 04/09/2015 94.3  80.0 - 100.0 fL Final  . MCH 04/09/2015 31.3  26.0 - 34.0 pg Final  . MCHC 04/09/2015  33.2  32.0 - 36.0 g/dL Final  . RDW 04/09/2015 14.9* 11.5 - 14.5 % Final  . Platelets 04/09/2015 264  150 - 440 K/uL Final  . Neutrophils Relative % 04/09/2015 62   Final  . Neutro Abs 04/09/2015 2.2  1.4 - 6.5 K/uL Final  . Lymphocytes Relative 04/09/2015 27   Final  . Lymphs Abs 04/09/2015 0.9* 1.0 - 3.6 K/uL Final  . Monocytes Relative 04/09/2015 8   Final  . Monocytes Absolute 04/09/2015 0.3  0.2 - 0.9 K/uL Final  . Eosinophils Relative 04/09/2015 2   Final  . Eosinophils Absolute 04/09/2015 0.1  0 - 0.7 K/uL Final  . Basophils Relative 04/09/2015 1   Final  . Basophils Absolute 04/09/2015 0.0  0 - 0.1 K/uL Final  . Sodium 04/09/2015 137  135 - 145 mmol/L Final  . Potassium 04/09/2015 3.2* 3.5 - 5.1 mmol/L Final  . Chloride 04/09/2015 102  101 - 111 mmol/L Final  . CO2 04/09/2015 29  22 - 32 mmol/L Final  . Glucose, Bld 04/09/2015 85  65 - 99 mg/dL Final  . BUN 04/09/2015 15  6 - 20 mg/dL Final  . Creatinine, Ser 04/09/2015 0.87  0.44 - 1.00 mg/dL Final  . Calcium 04/09/2015 9.2  8.9 - 10.3 mg/dL Final  . Total Protein 04/09/2015 6.4* 6.5 - 8.1 g/dL Final  . Albumin 04/09/2015 3.8  3.5 - 5.0 g/dL Final  . AST 04/09/2015 23  15 - 41 U/L Final  . ALT 04/09/2015 14  14 - 54 U/L Final  . Alkaline Phosphatase 04/09/2015 50  38 - 126 U/L Final  . Total Bilirubin 04/09/2015 0.4  0.3 - 1.2 mg/dL Final  . GFR calc non Af Amer 04/09/2015 >60  >60 mL/min Final  . GFR calc Af Amer 04/09/2015 >60  >60 mL/min Final   Comment: (NOTE) The eGFR has been calculated using the CKD EPI equation. This calculation has not been validated in all clinical situations. eGFR's persistently <60 mL/min signify possible Chronic Kidney Disease.   . Anion gap 04/09/2015 6  5 - 15 Final       ASSESSMENT: Gastrointestinal stromal tumor status post resection with multiple postoperative complications Patient is tolerating Gleevec 300 mg alternating with 400 mg Tolerating treatment very well. 2.  Swelling  of the left lower extremity be ordered Doppler study today to rule out any deep vein thrombosis 3.  Hemorrhoids patient refuses to see surgeon 4.  Anemia is most likely iron deficiency and associated with bleeding from hemorrhoids Will check iron studies patient will not take oral iron therapy If patient is iron deficient will do intravenous iron Repeat PET scan after next visit reassessment of the tumor    .vis   Patient expressed understanding and was in agreement with this plan. She also understands that She can call clinic at any time with any questions, concerns, or complaints.    No matching staging information was found for the patient.  Forest Gleason, MD   04/09/2015 11:36 AM

## 2015-04-12 ENCOUNTER — Telehealth: Payer: Self-pay | Admitting: *Deleted

## 2015-04-12 NOTE — Telephone Encounter (Signed)
Patient was given information to help her with cost of Gleevac at her appointment on 04-09-15.  Patient called back to say the number on the form was incorrect.  Called patient back to inform her that I do not have another phone number and she told be when she called and was told the number given to her was incorrect she was given the correct number.  Advised her to call the new number.  Patient verbalized understanding.

## 2015-04-24 ENCOUNTER — Other Ambulatory Visit: Payer: Self-pay | Admitting: *Deleted

## 2015-04-24 ENCOUNTER — Telehealth: Payer: Self-pay | Admitting: *Deleted

## 2015-04-24 DIAGNOSIS — C49A0 Gastrointestinal stromal tumor, unspecified site: Secondary | ICD-10-CM

## 2015-04-24 MED ORDER — IMATINIB MESYLATE 100 MG PO TABS: ORAL_TABLET | ORAL | Status: DC

## 2015-04-24 NOTE — Telephone Encounter (Signed)
Called patient and left message for patient to bring proof of income (SS information or W2) to her next appointment.  Will also need her signature when she comes for forms to help with her medication.

## 2015-04-29 ENCOUNTER — Ambulatory Visit
Admission: RE | Admit: 2015-04-29 | Discharge: 2015-04-29 | Disposition: A | Discharge status: Home / Self Care | Payer: Medicare PPO | Source: Ambulatory Visit | Attending: Internal Medicine | Admitting: Internal Medicine

## 2015-04-29 ENCOUNTER — Other Ambulatory Visit: Payer: Self-pay | Admitting: Internal Medicine

## 2015-04-29 DIAGNOSIS — Z1231 Encounter for screening mammogram for malignant neoplasm of breast: Secondary | ICD-10-CM

## 2015-04-30 ENCOUNTER — Other Ambulatory Visit: Payer: Self-pay | Admitting: *Deleted

## 2015-04-30 MED ORDER — FUROSEMIDE 20 MG PO TABS: 20.0000 mg | ORAL_TABLET | Freq: Every day | ORAL | Status: DC | PRN

## 2015-05-01 NOTE — Progress Notes (Unsigned)
PSN spoke with [employer] representative today.  PSN informed [employer] that patient will need financial assistance with her 25% cost share of the drug, Gleevec.  Application will be sent to [employer] patient assistance program today.  May take 2-3 business days for a decision to be made.

## 2015-05-03 ENCOUNTER — Other Ambulatory Visit: Payer: Self-pay | Admitting: *Deleted

## 2015-05-03 MED ORDER — POTASSIUM CHLORIDE CRYS ER 20 MEQ PO TBCR: 20.0000 meq | EXTENDED_RELEASE_TABLET | Freq: Every day | ORAL | Status: DC

## 2015-05-06 ENCOUNTER — Telehealth: Payer: Self-pay | Admitting: *Deleted

## 2015-05-06 NOTE — Telephone Encounter (Signed)
Asking where things stand with copay assistance as she will be out of medicine Wednesday. I explained per chart application has been submitted and we are awaiting to hear back from [employer], but she may not get med before she runs out.

## 2015-05-07 ENCOUNTER — Telehealth: Payer: Self-pay | Admitting: *Deleted

## 2015-05-07 NOTE — Progress Notes (Unsigned)
Patient was approved for a one month supply of Gleevec from [employer] patient assistance foundation.  Triage RN informed patient that she will need to call [employer] on 05/10/15 to request a refill on the medication.  Patient will need to apply for co-pay assistance from the Patient Access Network.  PSN will assist with this application.

## 2015-05-07 NOTE — Telephone Encounter (Signed)
I called pt after speaking with Courtney [name] to let her know that her Pt assistance has been temporarily approved and that she needs to call [employer] to arrange shipment on Friday [phone]. She read the number back to me and said she will call them Friday

## 2015-05-15 ENCOUNTER — Other Ambulatory Visit: Payer: Self-pay | Admitting: *Deleted

## 2015-05-15 ENCOUNTER — Telehealth: Payer: Self-pay | Admitting: *Deleted

## 2015-05-15 DIAGNOSIS — C49A0 Gastrointestinal stromal tumor, unspecified site: Secondary | ICD-10-CM

## 2015-05-15 NOTE — Telephone Encounter (Signed)
Pt called to see if needs to have CT scan set up prior to appt on 3/1. Stated has seen Dr. Emily Filbert recently for back pain. Informed pt that will schedule pt for CT scan before her next appt and scheduling will call her with appt details. Instructed pt to continue taking medication as prescribed by Dr. Sabra Heck. Pt verbalized understanding.

## 2015-05-15 NOTE — Telephone Encounter (Signed)
Too early for refill  

## 2015-05-20 ENCOUNTER — Telehealth: Payer: Self-pay | Admitting: *Deleted

## 2015-05-20 ENCOUNTER — Ambulatory Visit
Admission: RE | Admit: 2015-05-20 | Discharge: 2015-05-20 | Disposition: A | Discharge status: Home / Self Care | Payer: Medicare PPO | Source: Ambulatory Visit | Attending: Oncology | Admitting: Oncology

## 2015-05-20 ENCOUNTER — Other Ambulatory Visit: Payer: Self-pay | Admitting: *Deleted

## 2015-05-20 DIAGNOSIS — C49A0 Gastrointestinal stromal tumor, unspecified site: Secondary | ICD-10-CM | POA: Insufficient documentation

## 2015-05-20 DIAGNOSIS — Z9049 Acquired absence of other specified parts of digestive tract: Secondary | ICD-10-CM | POA: Diagnosis not present

## 2015-05-20 MED ORDER — IOHEXOL 300 MG/ML  SOLN
85.0000 mL | Freq: Once | INTRAMUSCULAR | Status: AC | PRN
  Administered 2015-05-20: 85 mL via INTRAVENOUS

## 2015-05-20 MED ORDER — FUROSEMIDE 20 MG PO TABS: 20.0000 mg | ORAL_TABLET | Freq: Every day | ORAL | Status: DC | PRN

## 2015-05-20 NOTE — Telephone Encounter (Signed)
Pt received letter in mail from [facility] regarding [place] prescription. Pt very concerned because she gets gleevec free through [employer]. Instructed pt to bring letter to appt on 3/1 to review in order to determine if further action is necessary. Pt verbalized understanding.

## 2015-05-22 ENCOUNTER — Inpatient Hospital Stay (HOSPITAL_BASED_OUTPATIENT_CLINIC_OR_DEPARTMENT_OTHER): Payer: Medicare PPO | Admitting: Oncology

## 2015-05-22 ENCOUNTER — Inpatient Hospital Stay: Payer: Medicare PPO | Attending: Oncology

## 2015-05-22 VITALS — BP 146/83 | HR 88 | Temp 95.7°F | Resp 18 | Wt 115.7 lb

## 2015-05-22 DIAGNOSIS — D649 Anemia, unspecified: Secondary | ICD-10-CM | POA: Diagnosis not present

## 2015-05-22 DIAGNOSIS — F329 Major depressive disorder, single episode, unspecified: Secondary | ICD-10-CM | POA: Insufficient documentation

## 2015-05-22 DIAGNOSIS — C49A0 Gastrointestinal stromal tumor, unspecified site: Secondary | ICD-10-CM

## 2015-05-22 DIAGNOSIS — K649 Unspecified hemorrhoids: Secondary | ICD-10-CM | POA: Diagnosis not present

## 2015-05-22 DIAGNOSIS — K219 Gastro-esophageal reflux disease without esophagitis: Secondary | ICD-10-CM | POA: Insufficient documentation

## 2015-05-22 DIAGNOSIS — R600 Localized edema: Secondary | ICD-10-CM | POA: Diagnosis not present

## 2015-05-22 DIAGNOSIS — R109 Unspecified abdominal pain: Secondary | ICD-10-CM

## 2015-05-22 DIAGNOSIS — M1990 Unspecified osteoarthritis, unspecified site: Secondary | ICD-10-CM | POA: Insufficient documentation

## 2015-05-22 LAB — CBC
HCT: 28.9 % — ABNORMAL LOW (ref 35.0–47.0)
HEMOGLOBIN: 9.7 g/dL — AB (ref 12.0–16.0)
MCH: 31.9 pg (ref 26.0–34.0)
MCHC: 33.7 g/dL (ref 32.0–36.0)
MCV: 94.7 fL (ref 80.0–100.0)
PLATELETS: 332 10*3/uL (ref 150–440)
RBC: 3.05 MIL/uL — AB (ref 3.80–5.20)
RDW: 15.7 % — ABNORMAL HIGH (ref 11.5–14.5)
WBC: 5.1 10*3/uL (ref 3.6–11.0)

## 2015-05-22 LAB — COMPREHENSIVE METABOLIC PANEL
ALT: 13 U/L — AB (ref 14–54)
AST: 23 U/L (ref 15–41)
Albumin: 4 g/dL (ref 3.5–5.0)
Alkaline Phosphatase: 55 U/L (ref 38–126)
Anion gap: 7 (ref 5–15)
BILIRUBIN TOTAL: 0.3 mg/dL (ref 0.3–1.2)
BUN: 17 mg/dL (ref 6–20)
CHLORIDE: 102 mmol/L (ref 101–111)
CO2: 25 mmol/L (ref 22–32)
CREATININE: 0.87 mg/dL (ref 0.44–1.00)
Calcium: 8.8 mg/dL — ABNORMAL LOW (ref 8.9–10.3)
GFR calc Af Amer: >60 mL/min (ref >60)
GLUCOSE: 98 mg/dL (ref 65–99)
POTASSIUM: 3.1 mmol/L — AB (ref 3.5–5.1)
Sodium: 134 mmol/L — ABNORMAL LOW (ref 135–145)
TOTAL PROTEIN: 7 g/dL (ref 6.5–8.1)

## 2015-05-22 MED ORDER — SUNITINIB MALATE 25 MG PO CAPS: ORAL_CAPSULE | ORAL | Status: DC

## 2015-05-22 NOTE — Progress Notes (Signed)
Patient states she had projectile vomiting after drinking contrast for CT.  Also had massive amounts of diarrhea.  Did not take her Gleevac on Monday due to all the vomiting.  Started back yesterday.  Patient here today for CT results.

## 2015-05-24 ENCOUNTER — Telehealth: Payer: Self-pay | Admitting: *Deleted

## 2015-05-24 NOTE — Telephone Encounter (Signed)
Called to state that she is willing to take XRT now if you think that is what she needs

## 2015-05-27 ENCOUNTER — Other Ambulatory Visit: Payer: Self-pay | Admitting: *Deleted

## 2015-05-27 ENCOUNTER — Encounter: Payer: Self-pay | Admitting: Oncology

## 2015-05-27 DIAGNOSIS — C49A0 Gastrointestinal stromal tumor, unspecified site: Secondary | ICD-10-CM

## 2015-05-27 MED ORDER — SUNITINIB MALATE 37.5 MG PO CAPS
ORAL_CAPSULE | ORAL | Status: DC
Start: 2015-05-27 — End: 2015-06-12

## 2015-05-27 NOTE — Progress Notes (Signed)
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  Patient has been started on [place] therapy from June of 2016 Presently tolerating only 300 mg daily tablets.  INTERVAL HISTORY:  75 year old lady with a history of gastrointestinal stromal tumor  1.patient  is on daily VAC 300 alternating with 400 mg and tolerating very well 2.Continues to a problem with hemorrhoids with occasional bleeding.  But refuses to go and  See surgeon. 3.Swelling of the left lower extremity which she says is chronic but was not seen in the past In December the ultrasound was negative for any thrombosi  4.  Repeat CT scan shows progressive lymphadenopathy in the retroperitoneal area Patient has been taken off   gleevac and will be started on Sutent 25 mg 3 weeks on and one-week off program from March of 2017.  Review of systems: Gen.  Patient is feeling somewhat depressed.  Continues to have intermittent abdominal pain . HEENT no soreness in the mouth. GI ;  Continues to have problem with hemorrhoids with occasional bleeding swelling and pain patient is doing sitz bath.  She does not want to go   And see  surgeon. Cardiovascular system: Continues to have lower abdominal pain No chest pain.  No palpitation.  No paroxysmal nocturnal dyspnea. Neurological system: No dizziness.  No tingling.  His was.  no tingling numbness.  no focal weakness or any focal signs. Skin: No evidence of ecchymosis or rash. Lower extremity:swelling of the left lower extremity As per HPI. Otherwise, a complete review of systems is negatve.  PAST MEDICAL HISTORY: Diverticulitis. Degenerative arthritis disease Gastroesophageal  reflux disease  PAST SURGICAL HISTORY: Recent surgery for gastrointestinal stromal tumor followed by diverting colostomy because of complications now colostomy had been reversed. FAMILY HISTORY There is no significant family history of breast cancer, ovarian cancer, colon cancer ADVANCED DIRECTIVES:  She does have a living will  HEALTH MAINTENANCE: Social History  Substance Use Topics  . Smoking status: Never Smoker   . Smokeless tobacco: None  . Alcohol Use: No      Allergies  Allergen Reactions  . Penicillins Swelling    HIVES     OBJECTIVE:  Filed Vitals:   05/22/15 1007  BP: 146/83  Pulse: 88  Temp: 95.7 F (35.4 C)  Resp: 18     Body mass index is 23.36 kg/(m^2).    ECOG FS:1 - Symptomatic but completely ambulatory  PHYSICAL EXAM: Gen. status: Patient is alert and oriented not in acute distress Head exam was generally normal. There was no scleral icterus or corneal arcus. Mucous membranes were moist. Periorbital edema Examination of the chest was unremarkable. There were no bony deformities, no asymmetry, and no other abnormalities. Cardiac exam revealed the PMI to be normally situated and sized. The rhythm was regular and no extrasystoles were noted during several minutes of auscultation. The first and second heart sounds were normal and physiologic splitting of the second heart sound was noted. There were no murmurs, rubs, clicks, or gallops. Abdomen: Tenderness in the left lower quadrant possibly a palpable mass in left lower quadrant Lower extremity     : Swelling of the left lower extremity        ;  Examination of the skin revealed no evidence of significant rashes, suspicious appearing nevi or other concerning lesions. Neurologically, the patient was awake, alert, and oriented to person, place and time. There were no obvious focal neurologic abnormalities.   LAB RESULTS:  Appointment on 05/22/2015  Component Date Value Ref Range Status  . WBC 05/22/2015  5.1  3.6 - 11.0 K/uL Final  . RBC 05/22/2015 3.05* 3.80 - 5.20 MIL/uL Final  . Hemoglobin 05/22/2015 9.7* 12.0 - 16.0 g/dL Final  . HCT 05/22/2015 28.9* 35.0 - 47.0 % Final  . MCV 05/22/2015 94.7  80.0 - 100.0 fL Final  . MCH 05/22/2015 31.9  26.0 - 34.0 pg Final  . MCHC 05/22/2015 33.7  32.0 - 36.0 g/dL Final  . RDW 05/22/2015 15.7* 11.5 - 14.5 % Final  . Platelets 05/22/2015 332  150 - 440 K/uL Final  . Sodium 05/22/2015 134* 135 - 145 mmol/L Final  . Potassium 05/22/2015 3.1* 3.5 - 5.1 mmol/L Final  . Chloride 05/22/2015 102  101 - 111 mmol/L Final  . CO2 05/22/2015 25  22 - 32 mmol/L Final  . Glucose, Bld 05/22/2015 98  65 - 99 mg/dL Final  . BUN 05/22/2015 17  6 - 20 mg/dL Final  . Creatinine, Ser 05/22/2015 0.87  0.44 - 1.00 mg/dL Final  . Calcium 05/22/2015 8.8* 8.9 - 10.3 mg/dL Final  . Total Protein 05/22/2015 7.0  6.5 - 8.1 g/dL Final  . Albumin 05/22/2015 4.0  3.5 - 5.0 g/dL Final  . AST 05/22/2015 23  15 - 41 U/L Final  . ALT 05/22/2015 13* 14 - 54 U/L Final  . Alkaline Phosphatase 05/22/2015 55  38 - 126 U/L Final  . Total Bilirubin 05/22/2015 0.3  0.3 - 1.2 mg/dL Final  . GFR calc non Af Amer 05/22/2015 >60  >60 mL/min Final  . GFR calc Af Amer 05/22/2015 >60  >60 mL/min Final   Comment: (NOTE) The eGFR has been calculated using the CKD EPI equation. This calculation has not been validated in all clinical situations. eGFR's persistently <60 mL/min signify possible Chronic Kidney Disease.   . 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 2.  Swelling of the left lower extremity be ordered Doppler study today to rule out any deep vein thrombosis 3.  Hemorrhoids patient refuses to see surgeon Anemia persist. CT scan has been reviewed independently shows progressive  disease Patient will be taken off gleevac  and will be started on Sutent. Repeat CT scan in 3 months She has been discussed with the patient.  CT scan has been reviewed independently and reviewed with the patient. Further surgical intervention versus radiation therapy for local control of symptoms can be considered down the road if needed  Total duration of visit was30 minutes.  50% or more time was spent in counseling patient and family regarding prognosis and options of treatment and available resources Re: Progressing disease change of treatment various side effects of Sutent  .vis   Patient expressed understanding and was in agreement with this plan. She also understands that She can call clinic at any time with any questions, concerns, or complaints.    No matching staging information was found for the patient.  Forest Gleason, MD   05/27/2015 8:06 AM

## 2015-05-28 ENCOUNTER — Telehealth: Payer: Self-pay | Admitting: *Deleted

## 2015-05-28 NOTE — Telephone Encounter (Signed)
[employer] called patient who informed them she will no longer be taking Gleevac.  Wanted to verify that is correct.  Checked with Hayley, RN who confirmed patient is no longer taking.    Notified Kayla at [employer], who will cancel shipment.

## 2015-05-30 ENCOUNTER — Telehealth: Payer: Self-pay | Admitting: *Deleted

## 2015-05-30 MED ORDER — HYDROCODONE-ACETAMINOPHEN 5-325 MG PO TABS: 1.0000 | ORAL_TABLET | Freq: Four times a day (QID) | ORAL | Status: DC | PRN

## 2015-05-30 NOTE — Telephone Encounter (Signed)
Called to report shei s having pain in her left side and asking when she should start taking her new medication. Per [facility], she will not let us send to [facility] which is the only place her insurance will allow. Hayley will check with [employer].  Per Dr Oliva Bustard, Norco to be ordered. Pt informed of new med ordered adn will pick up rx in morning, She will continue to use Tramadol until she starts Norco and she verbalized she will not take them after starting norco

## 2015-06-05 ENCOUNTER — Telehealth: Payer: Self-pay | Admitting: *Deleted

## 2015-06-05 NOTE — Telephone Encounter (Signed)
Patient's medication has been approved but they are unable to deliver because they have no physical address. Wants to know if we have anything other than a PO Box?

## 2015-06-05 NOTE — Telephone Encounter (Signed)
Called patient and obtained physical address: [address] [place], [place]  [address]  Called Cara back @ [facility] and gave information.  They also requested medication list.  Faxed to [facility] @ [phone].

## 2015-06-10 ENCOUNTER — Telehealth: Payer: Self-pay | Admitting: *Deleted

## 2015-06-10 NOTE — Telephone Encounter (Signed)
Patient called today to notify our office that has received sutent in the mail and continues to have pain in left side. Pills received were sutent 25mg  capsules to take daily x 21 days then 7 days off. This is different from prescription sent to [facility] for patient. Will discuss with MD on Wednesday 3/22.

## 2015-06-12 ENCOUNTER — Telehealth: Payer: Self-pay | Admitting: *Deleted

## 2015-06-12 ENCOUNTER — Other Ambulatory Visit: Payer: Self-pay | Admitting: *Deleted

## 2015-06-12 DIAGNOSIS — C49A0 Gastrointestinal stromal tumor, unspecified site: Secondary | ICD-10-CM

## 2015-06-12 MED ORDER — SUNITINIB MALATE 25 MG PO CAPS: ORAL_CAPSULE | ORAL | Status: DC

## 2015-06-12 NOTE — Telephone Encounter (Signed)
Called patient to inform her to start taking her Sutent as directed.  Will schedule her to be seen in April.  Patient verbalized understanding.

## 2015-07-04 ENCOUNTER — Telehealth: Payer: Self-pay | Admitting: *Deleted

## 2015-07-04 NOTE — Telephone Encounter (Signed)
Pt returned phone call and stated needs refill on sutent. [facility] has been notified and refill has been ordered. Pt should expect refill in 2 business days. Pt aware and verbalized understanding.

## 2015-07-04 NOTE — Telephone Encounter (Signed)
Documentation regarding sutent was on my desk this morning. Unsure if pt needs refill or if need assistance with anything regarding sutent. Left voicemail with pt to call me back to let me know if needs any further assistance.

## 2015-07-11 ENCOUNTER — Inpatient Hospital Stay: Payer: Medicare PPO | Attending: Oncology

## 2015-07-11 ENCOUNTER — Inpatient Hospital Stay (HOSPITAL_BASED_OUTPATIENT_CLINIC_OR_DEPARTMENT_OTHER): Payer: Medicare PPO | Admitting: Oncology

## 2015-07-11 VITALS — BP 152/72 | HR 86 | Temp 96.5°F | Resp 18 | Wt 114.6 lb

## 2015-07-11 DIAGNOSIS — K219 Gastro-esophageal reflux disease without esophagitis: Secondary | ICD-10-CM

## 2015-07-11 DIAGNOSIS — C49A0 Gastrointestinal stromal tumor, unspecified site: Secondary | ICD-10-CM

## 2015-07-11 DIAGNOSIS — R600 Localized edema: Secondary | ICD-10-CM | POA: Diagnosis not present

## 2015-07-11 DIAGNOSIS — M1990 Unspecified osteoarthritis, unspecified site: Secondary | ICD-10-CM

## 2015-07-11 DIAGNOSIS — Z79899 Other long term (current) drug therapy: Secondary | ICD-10-CM | POA: Diagnosis not present

## 2015-07-11 DIAGNOSIS — F319 Bipolar disorder, unspecified: Secondary | ICD-10-CM | POA: Diagnosis not present

## 2015-07-11 DIAGNOSIS — K649 Unspecified hemorrhoids: Secondary | ICD-10-CM | POA: Diagnosis not present

## 2015-07-11 LAB — COMPREHENSIVE METABOLIC PANEL
ALT: 18 U/L (ref 14–54)
AST: 17 U/L (ref 15–41)
Albumin: 3.7 g/dL (ref 3.5–5.0)
Alkaline Phosphatase: 44 U/L (ref 38–126)
Anion gap: 4 — ABNORMAL LOW (ref 5–15)
BUN: 12 mg/dL (ref 6–20)
CHLORIDE: 110 mmol/L (ref 101–111)
CO2: 24 mmol/L (ref 22–32)
CREATININE: 0.8 mg/dL (ref 0.44–1.00)
Calcium: 8.8 mg/dL — ABNORMAL LOW (ref 8.9–10.3)
GFR calc Af Amer: >60 mL/min (ref >60)
GFR calc non Af Amer: >60 mL/min (ref >60)
GLUCOSE: 142 mg/dL — AB (ref 65–99)
Potassium: 3.3 mmol/L — ABNORMAL LOW (ref 3.5–5.1)
SODIUM: 138 mmol/L (ref 135–145)
Total Bilirubin: 0.3 mg/dL (ref 0.3–1.2)
Total Protein: 6.4 g/dL — ABNORMAL LOW (ref 6.5–8.1)

## 2015-07-11 LAB — CBC WITH DIFFERENTIAL/PLATELET
BASOS ABS: 0 10*3/uL (ref 0–0.1)
Basophils Relative: 1 %
Eosinophils Absolute: 0.1 10*3/uL (ref 0–0.7)
Eosinophils Relative: 3 %
HEMATOCRIT: 32.6 % — AB (ref 35.0–47.0)
Hemoglobin: 11.2 g/dL — ABNORMAL LOW (ref 12.0–16.0)
LYMPHS PCT: 25 %
Lymphs Abs: 1.2 10*3/uL (ref 1.0–3.6)
MCH: 32.5 pg (ref 26.0–34.0)
MCHC: 34.4 g/dL (ref 32.0–36.0)
MCV: 94.7 fL (ref 80.0–100.0)
MONO ABS: 0.3 10*3/uL (ref 0.2–0.9)
Monocytes Relative: 7 %
NEUTROS ABS: 3 10*3/uL (ref 1.4–6.5)
Neutrophils Relative %: 64 %
Platelets: 291 10*3/uL (ref 150–440)
RBC: 3.45 MIL/uL — ABNORMAL LOW (ref 3.80–5.20)
RDW: 16.1 % — AB (ref 11.5–14.5)
WBC: 4.7 10*3/uL (ref 3.6–11.0)

## 2015-07-11 NOTE — Progress Notes (Signed)
Patient states she is feeling good.  She is eating anything she wants.  Weight back up to 114#.

## 2015-07-12 ENCOUNTER — Encounter: Payer: Self-pay | Admitting: Oncology

## 2015-07-12 NOTE — Progress Notes (Signed)
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  Patient has been started on [place] therapy from June of 2016 Presently tolerating only 300 mg daily tablets.  INTERVAL HISTORY:  75 year old lady with a history of gastrointestinal stromal tumor  1.patient  is on daily VAC 300 alternating with 400 mg and tolerating very well 2.Continues to a problem with hemorrhoids with occasional bleeding.  But refuses to go and  See surgeon. 3.Swelling of the left lower extremity which she says is chronic but was not seen in the past In December the ultrasound was negative for any thrombosi  4.  Repeat CT scan shows progressive lymphadenopathy in the retroperitoneal area Patient has been taken off   gleevac and will be started on Sutent 25 mg 3 weeks on and one-week off program from March of 2017.  Review of systems: Condition his improved appetite is improving abdominal discomfort and pain is improving.  Patient is tolerating Sutent very well\GENERAL:  Feels good.  Active.  No fevers, sweats or weight loss. PERFORMANCE STATUS (ECOG):01 HEENT:  No visual changes, runny nose, sore throat, mouth sores or tenderness. Lungs: No shortness of breath or cough.  No hemoptysis. Cardiac:  No chest pain, palpitations, orthopnea, or PND. GI:  No nausea, vomiting, diarrhea, constipation, melena or hematochezia. GU:  No urgency, frequency, dysuria, or hematuria. Musculoskeletal:  No back pain.  No joint pain.  No muscle tenderness. Extremities:  No pain or swelling. Skin:  No rashes or skin changes. Neuro:  No headache, numbness or weakness, balance or coordination issues. Endocrine:  No diabetes, thyroid  issues, hot flashes or night sweats. Psych:  No mood changes, depression or anxiety. Pain:  No focal pain. Review of systems:  All other systems reviewed and found to be negative.  PAST MEDICAL HISTORY: Diverticulitis. Degenerative arthritis disease Gastroesophageal reflux disease  PAST SURGICAL HISTORY: Recent surgery for gastrointestinal stromal tumor followed by diverting colostomy because of complications now colostomy had been reversed. FAMILY HISTORY There is no significant family history of breast cancer, ovarian cancer, colon cancer ADVANCED DIRECTIVES:  She does have a living will  HEALTH MAINTENANCE: Social History  Substance Use Topics  . Smoking status: Never Smoker   . Smokeless tobacco: None  . Alcohol Use: No      Allergies  Allergen Reactions  . Penicillins Swelling    HIVES     OBJECTIVE:  Filed Vitals:   07/11/15 1035  BP: 152/72  Pulse: 86  Temp: 96.5 F (35.8 C)  Resp: 18     Body mass index is 23.14 kg/(m^2).    ECOG FS:1 - Symptomatic but completely ambulatory  PHYSICAL EXAM: Gen. status: Patient is alert and oriented not in acute distress Head exam was generally normal. There was no scleral icterus or corneal arcus. Mucous membranes were moist. Periorbital edema Examination of the chest was unremarkable. There were no bony deformities, no asymmetry, and no other abnormalities. Cardiac exam revealed the PMI to be normally situated and sized. The rhythm was regular and no extrasystoles were noted during several minutes of auscultation. The first and second heart sounds were normal and physiologic splitting of the second heart sound was noted. There were no murmurs,  rubs, clicks, or gallops. Abdomen: Tenderness in the left lower quadrant possibly a palpable mass in left lower quadrant Lower extremity     : Swelling of the left lower extremity        ;  Examination of the skin revealed no evidence of significant rashes, suspicious appearing nevi  or other concerning lesions. Neurologically, the patient was awake, alert, and oriented to person, place and time. There were no obvious focal neurologic abnormalities.   LAB RESULTS:  Appointment on 07/11/2015  Component Date Value Ref Range Status  . WBC 07/11/2015 4.7  3.6 - 11.0 K/uL Final  . RBC 07/11/2015 3.45* 3.80 - 5.20 MIL/uL Final  . Hemoglobin 07/11/2015 11.2* 12.0 - 16.0 g/dL Final  . HCT 07/11/2015 32.6* 35.0 - 47.0 % Final  . MCV 07/11/2015 94.7  80.0 - 100.0 fL Final  . MCH 07/11/2015 32.5  26.0 - 34.0 pg Final  . MCHC 07/11/2015 34.4  32.0 - 36.0 g/dL Final  . RDW 07/11/2015 16.1* 11.5 - 14.5 % Final  . Platelets 07/11/2015 291  150 - 440 K/uL Final  . Neutrophils Relative % 07/11/2015 64   Final  . Neutro Abs 07/11/2015 3.0  1.4 - 6.5 K/uL Final  . Lymphocytes Relative 07/11/2015 25   Final  . Lymphs Abs 07/11/2015 1.2  1.0 - 3.6 K/uL Final  . Monocytes Relative 07/11/2015 7   Final  . Monocytes Absolute 07/11/2015 0.3  0.2 - 0.9 K/uL Final  . Eosinophils Relative 07/11/2015 3   Final  . Eosinophils Absolute 07/11/2015 0.1  0 - 0.7 K/uL Final  . Basophils Relative 07/11/2015 1   Final  . Basophils Absolute 07/11/2015 0.0  0 - 0.1 K/uL Final  . Sodium 07/11/2015 138  135 - 145 mmol/L Final  . Potassium 07/11/2015 3.3* 3.5 - 5.1 mmol/L Final  . Chloride 07/11/2015 110  101 - 111 mmol/L Final  . CO2 07/11/2015 24  22 - 32 mmol/L Final  . Glucose, Bld 07/11/2015 142* 65 - 99 mg/dL Final  . BUN 07/11/2015 12  6 - 20 mg/dL Final  . Creatinine, Ser 07/11/2015 0.80  0.44 - 1.00 mg/dL Final  . Calcium 07/11/2015 8.8* 8.9 - 10.3 mg/dL Final  . Total Protein 07/11/2015 6.4* 6.5 - 8.1 g/dL Final  . Albumin 07/11/2015 3.7  3.5 - 5.0 g/dL Final  . AST 07/11/2015 17  15 - 41 U/L Final  . ALT 07/11/2015 18  14 - 54 U/L Final  . Alkaline Phosphatase 07/11/2015 44  38 - 126 U/L Final  . Total Bilirubin 07/11/2015 0.3  0.3 - 1.2 mg/dL Final  . GFR calc non Af Amer 07/11/2015  >60  >60 mL/min Final  . GFR calc Af Amer 07/11/2015 >60  >60 mL/min Final   Comment: (NOTE) The eGFR has been calculated using the CKD EPI equation. This calculation has not been validated in all clinical situations. eGFR's persistently <60 mL/min signify possible Chronic Kidney Disease.   . Anion gap 07/11/2015 4* 5 - 15 Final       ASSESSMENT: Gastrointestinal stromal tumor status post resection with multiple postoperative complications Patient is tolerating Gleevec 300 mg alternating with 400 mg Tolerating treatment very well. 2.  Swelling of the left lower extremity be ordered Doppler study today to rule out any deep vein thrombosis 3.  Hemorrhoids patient refuses to see surgeon   Patient is tolerating Sutent very well.  All lab data has been reviewed.  Generalized improvement.  Pain is  improved.  We will continue Sutent 25 mg 3 weeks on 1 week off\overall manic improvement out any significant side effect of Sutent.  During next go around might increase the dose to 37.5 mg   Patient expressed understanding and was in agreement with this plan. She also understands that She can call clinic at any time with any questions, concerns, or complaints.    No matching staging information was found for the patient.  Forest Gleason, MD   07/12/2015 7:23 PM

## 2015-08-08 ENCOUNTER — Encounter: Payer: Self-pay | Admitting: Oncology

## 2015-08-08 ENCOUNTER — Inpatient Hospital Stay (HOSPITAL_BASED_OUTPATIENT_CLINIC_OR_DEPARTMENT_OTHER): Payer: Medicare PPO | Admitting: Oncology

## 2015-08-08 ENCOUNTER — Inpatient Hospital Stay: Payer: Medicare PPO | Attending: Oncology

## 2015-08-08 VITALS — BP 153/81 | HR 82 | Temp 96.7°F | Resp 18 | Wt 118.6 lb

## 2015-08-08 DIAGNOSIS — Z79899 Other long term (current) drug therapy: Secondary | ICD-10-CM | POA: Insufficient documentation

## 2015-08-08 DIAGNOSIS — H05229 Edema of unspecified orbit: Secondary | ICD-10-CM | POA: Diagnosis not present

## 2015-08-08 DIAGNOSIS — C49A0 Gastrointestinal stromal tumor, unspecified site: Secondary | ICD-10-CM | POA: Diagnosis not present

## 2015-08-08 DIAGNOSIS — R1032 Left lower quadrant pain: Secondary | ICD-10-CM | POA: Insufficient documentation

## 2015-08-08 DIAGNOSIS — M7989 Other specified soft tissue disorders: Secondary | ICD-10-CM | POA: Diagnosis not present

## 2015-08-08 DIAGNOSIS — F319 Bipolar disorder, unspecified: Secondary | ICD-10-CM | POA: Diagnosis not present

## 2015-08-08 DIAGNOSIS — M1990 Unspecified osteoarthritis, unspecified site: Secondary | ICD-10-CM

## 2015-08-08 DIAGNOSIS — K649 Unspecified hemorrhoids: Secondary | ICD-10-CM | POA: Diagnosis not present

## 2015-08-08 LAB — COMPREHENSIVE METABOLIC PANEL
ALT: 21 U/L (ref 14–54)
ANION GAP: 4 — AB (ref 5–15)
AST: 21 U/L (ref 15–41)
Albumin: 4.1 g/dL (ref 3.5–5.0)
Alkaline Phosphatase: 50 U/L (ref 38–126)
BILIRUBIN TOTAL: 0.6 mg/dL (ref 0.3–1.2)
BUN: 16 mg/dL (ref 6–20)
CHLORIDE: 110 mmol/L (ref 101–111)
CO2: 24 mmol/L (ref 22–32)
Calcium: 9 mg/dL (ref 8.9–10.3)
Creatinine, Ser: 0.86 mg/dL (ref 0.44–1.00)
Glucose, Bld: 101 mg/dL — ABNORMAL HIGH (ref 65–99)
POTASSIUM: 3.5 mmol/L (ref 3.5–5.1)
Sodium: 138 mmol/L (ref 135–145)
TOTAL PROTEIN: 6.6 g/dL (ref 6.5–8.1)

## 2015-08-08 LAB — CBC WITH DIFFERENTIAL/PLATELET
BASOS ABS: 0 10*3/uL (ref 0–0.1)
Basophils Relative: 1 %
EOS PCT: 4 %
Eosinophils Absolute: 0.2 10*3/uL (ref 0–0.7)
HEMATOCRIT: 34.9 % — AB (ref 35.0–47.0)
Hemoglobin: 12.2 g/dL (ref 12.0–16.0)
LYMPHS ABS: 1.2 10*3/uL (ref 1.0–3.6)
LYMPHS PCT: 30 %
MCH: 34 pg (ref 26.0–34.0)
MCHC: 35 g/dL (ref 32.0–36.0)
MCV: 97.2 fL (ref 80.0–100.0)
MONO ABS: 0.4 10*3/uL (ref 0.2–0.9)
MONOS PCT: 9 %
Neutro Abs: 2.2 10*3/uL (ref 1.4–6.5)
Neutrophils Relative %: 56 %
PLATELETS: 236 10*3/uL (ref 150–440)
RBC: 3.6 MIL/uL — ABNORMAL LOW (ref 3.80–5.20)
RDW: 17.8 % — AB (ref 11.5–14.5)
WBC: 3.9 10*3/uL (ref 3.6–11.0)

## 2015-08-08 NOTE — Progress Notes (Signed)
[place] @ [facility] [phone]  Fax:(336) [phone]     [place] OB: [birth year]  MR#: [id number]  FTD#:[id number]  Patient Care Team: Rusty Aus, MD as PCP - General (Internal Medicine)  CHIEF COMPLAINT:  75 year old lady with a history of gastrointestinal stromal tumor  1.patient  is on dailGLEEVAC  300 alternating with 400 mg and tolerating very well 2.Continues to a problem with hemorrhoids with occasional bleeding.  But refuses to go and  See surgeon. 3.Swelling of the left lower extremity which she says is chronic but was not seen in the past In December the ultrasound was negative for any thrombosi  4.  Repeat CT scan shows progressive lymphadenopathy in the retroperitoneal area Patient has been taken off   gleevac and will be started on Sutent 25 mg 3 weeks on and one-week off program from March of 2017.  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  Patient has been started on [place] therapy from June of 2016   INTERVAL HISTORY:  Patient is tolerating Sutent and has gained weight.  Abdominal pain is improved.  No nausea no vomiting no diarrhea no rash.  Patient is here for ongoing evaluation and treatment consideration.  Review of systems: Condition his improved appetite is improving abdominal discomfort and pain is improving.  Patient is tolerating Sutent very well\GENERAL:  Feels good.  Active.  No fevers, sweats or weight loss../    PERFORMANCE STATUS (ECOG):01 HEENT:  No visual changes, runny nose, sore throat, mouth sores or tenderness. Lungs: No shortness of breath or cough.  No hemoptysis. Cardiac:  No chest pain, palpitations, orthopnea, or PND. GI:  No nausea, vomiting, diarrhea, constipation, melena or hematochezia. GU:  No urgency, frequency, dysuria, or hematuria. Musculoskeletal:  No back pain.  No joint pain.  No muscle tenderness. Extremities:  No pain or  swelling. Skin:  No rashes or skin changes. Neuro:  No headache, numbness or weakness, balance or coordination issues. Endocrine:  No diabetes, thyroid issues, hot flashes or night sweats. Psych:  No mood changes, depression or anxiety. Pain:  No focal pain. Review of systems:  All other systems reviewed and found to be negative.  PAST MEDICAL HISTORY: Diverticulitis. Degenerative arthritis disease Gastroesophageal reflux disease  PAST SURGICAL HISTORY: Recent surgery for gastrointestinal stromal tumor followed by diverting colostomy because of complications now colostomy had been reversed. FAMILY HISTORY There is no significant family history of breast cancer, ovarian cancer, colon cancer ADVANCED DIRECTIVES:  She does have a living will  HEALTH MAINTENANCE: Social History  Substance Use Topics  . Smoking status: Never Smoker   . Smokeless tobacco: None  . Alcohol Use: No      Allergies  Allergen Reactions  . Penicillins Swelling    HIVES     OBJECTIVE:  Filed Vitals:   08/08/15 0932  BP: 153/81  Pulse: 82  Temp: 96.7 F (35.9 C)  Resp: 18     Body mass index is 23.94 kg/(m^2).    ECOG FS:1 - Symptomatic but completely ambulatory  PHYSICAL EXAM: Gen. status: Patient is alert and oriented not in acute distress Head exam was generally normal. There was no scleral icterus or corneal arcus. Mucous membranes were moist. Periorbital edema Examination of the chest was unremarkable. There were no bony deformities, no asymmetry, and no other abnormalities. Cardiac exam revealed the PMI to be normally situated and sized. The rhythm was regular and no extrasystoles were  noted during several minutes of auscultation. The first and second heart sounds were normal and physiologic splitting of the second heart sound was noted. There were no murmurs, rubs, clicks, or gallops. Abdomen: Tenderness in the left lower quadrant possibly a palpable mass in left lower quadrant Has  improved.  No ascites. Lower extremity     : Swelling of the left lower extremity        ;  Examination of the skin revealed no evidence of significant rashes, suspicious appearing nevi or other concerning lesions. Neurologically, the patient was awake, alert, and oriented to person, place and time. There were no obvious focal neurologic abnormalities.   LAB RESULTS:  Appointment on 08/08/2015  Component Date Value Ref Range Status  . WBC 08/08/2015 3.9  3.6 - 11.0 K/uL Final  . RBC 08/08/2015 3.60* 3.80 - 5.20 MIL/uL Final  . Hemoglobin 08/08/2015 12.2  12.0 - 16.0 g/dL Final  . HCT 08/08/2015 34.9* 35.0 - 47.0 % Final  . MCV 08/08/2015 97.2  80.0 - 100.0 fL Final  . MCH 08/08/2015 34.0  26.0 - 34.0 pg Final  . MCHC 08/08/2015 35.0  32.0 - 36.0 g/dL Final  . RDW 08/08/2015 17.8* 11.5 - 14.5 % Final  . Platelets 08/08/2015 236  150 - 440 K/uL Final  . Neutrophils Relative % 08/08/2015 56   Final  . Neutro Abs 08/08/2015 2.2  1.4 - 6.5 K/uL Final  . Lymphocytes Relative 08/08/2015 30   Final  . Lymphs Abs 08/08/2015 1.2  1.0 - 3.6 K/uL Final  . Monocytes Relative 08/08/2015 9   Final  . Monocytes Absolute 08/08/2015 0.4  0.2 - 0.9 K/uL Final  . Eosinophils Relative 08/08/2015 4   Final  . Eosinophils Absolute 08/08/2015 0.2  0 - 0.7 K/uL Final  . Basophils Relative 08/08/2015 1   Final  . Basophils Absolute 08/08/2015 0.0  0 - 0.1 K/uL Final  . Sodium 08/08/2015 138  135 - 145 mmol/L Final  . Potassium 08/08/2015 3.5  3.5 - 5.1 mmol/L Final  . Chloride 08/08/2015 110  101 - 111 mmol/L Final  . CO2 08/08/2015 24  22 - 32 mmol/L Final  . Glucose, Bld 08/08/2015 101* 65 - 99 mg/dL Final  . BUN 08/08/2015 16  6 - 20 mg/dL Final  . Creatinine, Ser 08/08/2015 0.86  0.44 - 1.00 mg/dL Final  . Calcium 08/08/2015 9.0  8.9 - 10.3 mg/dL Final  . Total Protein 08/08/2015 6.6  6.5 - 8.1 g/dL Final  . Albumin 08/08/2015 4.1  3.5 - 5.0 g/dL Final  . AST 08/08/2015 21  15 - 41 U/L Final  . ALT  08/08/2015 21  14 - 54 U/L Final  . Alkaline Phosphatase 08/08/2015 50  38 - 126 U/L Final  . Total Bilirubin 08/08/2015 0.6  0.3 - 1.2 mg/dL Final  . GFR calc non Af Amer 08/08/2015 >60  >60 mL/min Final  . GFR calc Af Amer 08/08/2015 >60  >60 mL/min Final   Comment: (NOTE) The eGFR has been calculated using the CKD EPI equation. This calculation has not been validated in all clinical situations. eGFR's persistently <60 mL/min signify possible Chronic Kidney Disease.   . 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 2.  Swelling of the left lower extremity be ordered Doppler study  today to rule out any deep vein thrombosis 3.  Hemorrhoids patient refuses to see surgeon   Patient is tolerating Sutent very well.  All lab data has been reviewed.  Generalized improvement.  Pain is improved.  We will continue Sutent 25 mg 3 weeks on 1 week off\overall manic improvement out any significant side effect of Sutent.   Patient is tolerating treatment very well.  Clinically appears to be responding to the treatment.  In 4 weeks patient will be reevaluated by Dr. B patient is aware of my retirement plan.  In 8 weeks patients should have another PET scan for reevaluation if insurance does not appear PET scan and CT scan of abdomen and pelvis can be done.  I do not feel like increasing dose of Sutent as patient is tolerating 25 mg without any adverse side effect and quality of life is tolerable at present time.  All lab data has been reviewed. Patient expressed understanding and was in agreement with this plan. She also understands that She can call clinic at any time with any questions, concerns, or complaints.    No matching staging information was found for the patient.  Forest Gleason, MD    08/08/2015 9:39 AM

## 2015-08-22 ENCOUNTER — Telehealth: Payer: Self-pay | Admitting: *Deleted

## 2015-08-22 NOTE — Telephone Encounter (Signed)
Notified [facility]

## 2015-08-22 NOTE — Telephone Encounter (Signed)
She is due a refill and they need to hear from [name] if she is continuing the drug

## 2015-08-22 NOTE — Telephone Encounter (Signed)
Continue sutent. Okay to get refill.

## 2015-09-03 ENCOUNTER — Other Ambulatory Visit: Payer: Self-pay | Admitting: *Deleted

## 2015-09-03 DIAGNOSIS — C269 Malignant neoplasm of ill-defined sites within the digestive system: Secondary | ICD-10-CM

## 2015-09-05 ENCOUNTER — Inpatient Hospital Stay: Payer: Medicare PPO | Admitting: Internal Medicine

## 2015-09-05 ENCOUNTER — Inpatient Hospital Stay: Payer: Medicare PPO

## 2015-09-18 ENCOUNTER — Encounter: Payer: Self-pay | Admitting: *Deleted

## 2015-09-18 NOTE — Progress Notes (Signed)
Received fax msg from [employer]. Inquiring if patient is continuing her sutent. msg faxed back to [place] to confirm pt is still actively being tx with the sutent.

## 2015-09-20 ENCOUNTER — Inpatient Hospital Stay (HOSPITAL_BASED_OUTPATIENT_CLINIC_OR_DEPARTMENT_OTHER): Payer: Medicare PPO

## 2015-09-20 ENCOUNTER — Inpatient Hospital Stay: Payer: Medicare PPO | Attending: Internal Medicine | Admitting: Internal Medicine

## 2015-09-20 VITALS — BP 157/78 | HR 68 | Temp 96.7°F | Resp 18 | Wt 126.8 lb

## 2015-09-20 DIAGNOSIS — M1990 Unspecified osteoarthritis, unspecified site: Secondary | ICD-10-CM | POA: Diagnosis not present

## 2015-09-20 DIAGNOSIS — Z880 Allergy status to penicillin: Secondary | ICD-10-CM | POA: Diagnosis not present

## 2015-09-20 DIAGNOSIS — C49A4 Gastrointestinal stromal tumor of large intestine: Secondary | ICD-10-CM | POA: Diagnosis not present

## 2015-09-20 DIAGNOSIS — C269 Malignant neoplasm of ill-defined sites within the digestive system: Secondary | ICD-10-CM

## 2015-09-20 DIAGNOSIS — C786 Secondary malignant neoplasm of retroperitoneum and peritoneum: Secondary | ICD-10-CM | POA: Diagnosis not present

## 2015-09-20 DIAGNOSIS — K219 Gastro-esophageal reflux disease without esophagitis: Secondary | ICD-10-CM | POA: Insufficient documentation

## 2015-09-20 DIAGNOSIS — Z803 Family history of malignant neoplasm of breast: Secondary | ICD-10-CM | POA: Insufficient documentation

## 2015-09-20 DIAGNOSIS — I10 Essential (primary) hypertension: Secondary | ICD-10-CM | POA: Insufficient documentation

## 2015-09-20 DIAGNOSIS — Z79899 Other long term (current) drug therapy: Secondary | ICD-10-CM | POA: Diagnosis not present

## 2015-09-20 LAB — COMPREHENSIVE METABOLIC PANEL
ALT: 18 U/L (ref 14–54)
ANION GAP: 5 (ref 5–15)
AST: 19 U/L (ref 15–41)
Albumin: 4 g/dL (ref 3.5–5.0)
Alkaline Phosphatase: 41 U/L (ref 38–126)
BILIRUBIN TOTAL: 0.6 mg/dL (ref 0.3–1.2)
BUN: 17 mg/dL (ref 6–20)
CHLORIDE: 111 mmol/L (ref 101–111)
CO2: 25 mmol/L (ref 22–32)
Calcium: 9 mg/dL (ref 8.9–10.3)
Creatinine, Ser: 0.89 mg/dL (ref 0.44–1.00)
GFR calc non Af Amer: >60 mL/min (ref >60)
GLUCOSE: 97 mg/dL (ref 65–99)
POTASSIUM: 3.5 mmol/L (ref 3.5–5.1)
Sodium: 141 mmol/L (ref 135–145)
Total Protein: 6.4 g/dL — ABNORMAL LOW (ref 6.5–8.1)

## 2015-09-20 LAB — CBC WITH DIFFERENTIAL/PLATELET
Basophils Absolute: 0 10*3/uL (ref 0–0.1)
Basophils Relative: 0 %
Eosinophils Absolute: 0.1 10*3/uL (ref 0–0.7)
Eosinophils Relative: 2 %
HEMATOCRIT: 35.1 % (ref 35.0–47.0)
Hemoglobin: 12.5 g/dL (ref 12.0–16.0)
LYMPHS ABS: 1.4 10*3/uL (ref 1.0–3.6)
LYMPHS PCT: 31 %
MCH: 36.3 pg — ABNORMAL HIGH (ref 26.0–34.0)
MCHC: 35.7 g/dL (ref 32.0–36.0)
MCV: 101.5 fL — AB (ref 80.0–100.0)
MONO ABS: 0.3 10*3/uL (ref 0.2–0.9)
Monocytes Relative: 7 %
Neutro Abs: 2.6 10*3/uL (ref 1.4–6.5)
Neutrophils Relative %: 60 %
Platelets: 218 10*3/uL (ref 150–440)
RBC: 3.45 MIL/uL — AB (ref 3.80–5.20)
RDW: 16.1 % — ABNORMAL HIGH (ref 11.5–14.5)
WBC: 4.3 10*3/uL (ref 3.6–11.0)

## 2015-09-20 NOTE — Progress Notes (Signed)
Patient states she has gotten 2 deer ticks off of her body in the past month.  She just completed her second round of doxycycline.

## 2015-09-20 NOTE — Progress Notes (Signed)
[facility] OFFICE PROGRESS NOTE  Patient Care Team: Rusty Aus, MD as PCP - General (Internal Medicine)  No matching staging information was found for the patient.   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 Prognostic/predictive value]     [place] [place]   04/04/2014 Initial Diagnosis Malignant [place] of [place] ([facility]     INTERVAL HISTORY:  This is my first interaction with the patient as patient's primary oncologist has been Dr.Choksi. I reviewed the patient's prior charts/pertinent labs/imaging in detail; findings are summarized above.   SEvarose [name] 75y.o.  female pleasant patient above history of gist metastatic- is here for follow-up patient is currently on sunitinib since March 2017  Patient admits to good appetite. She is gaining weight. She is very active. No nausea no vomiting. Denies any headaches denies any rash palms and soles. No chest pain or shortness of breath or cough. Denies abdominal pain.  She feels she is having no side effects from sunitinib  REVIEW OF SYSTEMS:  A complete 10 point review of system is done which is negative except mentioned above/history  of present illness.   PAST MEDICAL HISTORY :  Past Medical History  Diagnosis Date  . Hypertension     NO MEDS NOW  . Tremors of nervous system     HEAD  . GERD (gastroesophageal reflux disease)   . History of hiatal hernia   . Arthritis   . [facility] ([facility]     GIST  . [place] (gastrointestinal stroma tumor), [place]     PAST SURGICAL HISTORY :   Past Surgical History  Procedure Laterality Date  . Abdominal hysterectomy    . Stomach surgery      CANCER  . Cataract extraction w/phaco Right 01/28/2015    Procedure: CATARACT EXTRACTION PHACO AND INTRAOCULAR LENS PLACEMENT (IOC);  Surgeon: SEstill Cotta MD;  Location: [facility] ORS;  Service: Ophthalmology;  Laterality: Right;  [place]:59.3 AP% 22.6 CDE 24.69 fluid pack lot ##[id number]  . Colon surgery      COLOSTOMY AND REVERSAL  . Cataract extraction w/phaco Left 02/11/2015    Procedure: CATARACT EXTRACTION PHACO AND INTRAOCULAR LENS PLACEMENT (IOC);  Surgeon: SEstill Cotta MD;  Location: [facility] ORS;  Service: Ophthalmology;  Laterality: Left;  [place]01:18 AP% 22.8 CDE 32.38  fluid pack lot # [id number]  . Breast biopsy Right     FAMILY HISTORY :   Family History  Problem Relation Age of Onset  . Breast cancer Maternal Grandmother     SOCIAL HISTORY:   Social History  Substance Use Topics  . Smoking status: Never Smoker   . Smokeless tobacco: Not on file  . Alcohol Use:  No    ALLERGIES:  is allergic to penicillins.  MEDICATIONS:  Current Outpatient Prescriptions  Medication Sig Dispense Refill  . calcium carbonate (TUMS - DOSED IN MG ELEMENTAL CALCIUM) 500 MG chewable tablet Chew 1 tablet by mouth daily.    . Cetirizine HCl (ZYRTEC PO) Take by mouth.    . Cyanocobalamin (RA VITAMIN B-12 TR) 1000 MCG TBCR Take by mouth.    . ILEVRO 0.3 % ophthalmic suspension INSTILL 1 DROP ONCE DAILY 2 DAYS PRIOR TO SURGERY IN RIGHT EYE AND 1 DROP IN AM OF SURGERY. CONTINUE WITH 1 DROP DAILY UNTIL FINISHED  0  .  Iron-Vitamin C (VITRON-C) 65-125 MG TABS Take by mouth.    . megestrol (MEGACE) 40 MG tablet TAKE ONE TABLET BY MOUTH EVERY DAY FOR APPETITE  2  . Multiple Vitamin (MULTIVITAMIN) tablet Take 1 tablet by mouth daily.    . potassium chloride SA (K-DUR,KLOR-CON) 20 MEQ tablet Take 1 tablet (20 mEq total) by mouth daily. 30 tablet 0  . ranitidine (ZANTAC) 150 MG tablet Take by mouth.    . SUNItinib (SUTENT) 25 MG capsule Take 1 capsule by mouth daily for 21 days then 7 days off.     No current facility-administered medications for this visit.    PHYSICAL EXAMINATION: ECOG PERFORMANCE STATUS: 0 - Asymptomatic  BP 157/78 mmHg  Pulse 68  Temp(Src) 96.7 F (35.9 C) (Tympanic)  Resp 18  Wt 126 lb 12.2 oz (57.5 kg)  Filed Weights   09/20/15 1020  Weight: 126 lb 12.2 oz (57.5 kg)    GENERAL: Thin built moderately nourished; Caucasian female patient Alert, no distress and comfortable. Alone.  EYES: no pallor or icterus OROPHARYNX: no thrush or ulceration; poor dentition  NECK: supple, no masses felt LYMPH:  no palpable lymphadenopathy in the cervical, axillary or inguinal regions LUNGS: clear to auscultation and  No wheeze or crackles HEART/CVS: regular rate & rhythm and no murmurs; No lower extremity edema ABDOMEN:abdomen soft, non-tender and normal bowel sounds Musculoskeletal:no cyanosis of digits and no clubbing  PSYCH: alert & oriented x 3 with fluent speech NEURO: no focal motor/sensory deficits SKIN:  no rashes or significant lesions  LABORATORY DATA:  I have reviewed the data as listed    Component Value Date/Time   NA 141 09/20/2015 0925   NA 140 07/12/2014 0902   K 3.5 09/20/2015 0925   K 3.1* 07/12/2014 0902   CL 111 09/20/2015 0925   CL 104 07/12/2014 0902   CO2 25 09/20/2015 0925   CO2 31 07/12/2014 0902   GLUCOSE 97 09/20/2015 0925   GLUCOSE 100* 07/12/2014 0902   BUN 17 09/20/2015 0925   BUN 13 07/12/2014 0902   CREATININE 0.89 09/20/2015 0925   CREATININE  0.49 07/12/2014 0902   CALCIUM 9.0 09/20/2015 0925   CALCIUM 9.1 07/12/2014 0902   PROT 6.4* 09/20/2015 0925   PROT 6.8 07/12/2014 0902   ALBUMIN 4.0 09/20/2015 0925   ALBUMIN 3.3* 07/12/2014 0902   AST 19 09/20/2015 0925   AST 15 07/12/2014 0902   ALT 18 09/20/2015 0925   ALT 11* 07/12/2014 0902   ALKPHOS 41 09/20/2015 0925   ALKPHOS 73 07/12/2014 0902   BILITOT 0.6 09/20/2015 0925   BILITOT 0.5 07/12/2014 0902   GFRNONAA >60 09/20/2015 0925   GFRNONAA >60 07/12/2014 0902   GFRNONAA >60 04/23/2014 1252   GFRAA >60 09/20/2015 0925   GFRAA >60 07/12/2014 0902   GFRAA >60 04/23/2014 1252    No results  found for: SPEP, UPEP  Lab Results  Component Value Date   WBC 4.3 09/20/2015   NEUTROABS 2.6 09/20/2015   HGB 12.5 09/20/2015   HCT 35.1 09/20/2015   MCV 101.5* 09/20/2015   PLT 218 09/20/2015      Chemistry      Component Value Date/Time   NA 141 09/20/2015 0925   NA 140 07/12/2014 0902   K 3.5 09/20/2015 0925   K 3.1* 07/12/2014 0902   CL 111 09/20/2015 0925   CL 104 07/12/2014 0902   CO2 25 09/20/2015 0925   CO2 31 07/12/2014 0902   BUN 17 09/20/2015 0925   BUN 13 07/12/2014 0902   CREATININE 0.89 09/20/2015 0925   CREATININE 0.49 07/12/2014 0902      Component Value Date/Time   CALCIUM 9.0 09/20/2015 0925   CALCIUM 9.1 07/12/2014 0902   ALKPHOS 41 09/20/2015 0925   ALKPHOS 73 07/12/2014 0902   AST 19 09/20/2015 0925   AST 15 07/12/2014 0902   ALT 18 09/20/2015 0925   ALT 11* 07/12/2014 0902   BILITOT 0.6 09/20/2015 0925   BILITOT 0.5 07/12/2014 0902       RADIOGRAPHIC STUDIES: I have personally reviewed the radiological images as listed and agreed with the findings in the report. No results found.   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 Clinically no evidence of progression noted- patient tolerating Sutent  well. Plan PET scan for reevaluation in the next 4 weeks.  Follow-up with me with labs and 5 weeks. Check TSH at next visit.   # I extensively reviewed the records- [facility]/pathology from 2015- summarized above.   # 25 minutes face-to-face with the patient discussing the above plan of care; more than 50% of time spent on prognosis/ natural history; counseling and coordination.    Orders Placed This Encounter  Procedures  . NM PET Image Restag (PS) Skull Base To Thigh    Standing Status: Future     Number of Occurrences:      Standing Expiration Date: 11/19/2016    Order Specific Question:  Reason for Exam (SYMPTOM  OR DIAGNOSIS REQUIRED)    Answer:  GIST    Order Specific Question:  Preferred imaging location?    Answer:  [facility]  . CBC with Differential    Standing Status: Future     Number of Occurrences:      Standing Expiration Date: 09/19/2016  . Comprehensive metabolic panel    Standing Status: Future     Number of Occurrences:      Standing Expiration Date: 09/19/2016    Order Specific Question:  Has the patient fasted?    Answer:  No   All questions were answered. The patient knows to call the clinic with any problems, questions or concerns.      Cammie Sickle, MD 09/20/2015 7:50 PM

## 2015-09-20 NOTE — Assessment & Plan Note (Addendum)
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 Clinically no evidence of progression noted- patient tolerating Sutent well. Plan PET scan for reevaluation in the next 4 weeks.  Follow-up with me with labs and 5 weeks. Check TSH at next visit.   # I extensively reviewed the records- [facility]/pathology from 2015- summarized above.   # 25 minutes face-to-face with the patient discussing the above plan of care; more than 50% of time spent on prognosis/ natural history; counseling and coordination.

## 2015-10-18 ENCOUNTER — Ambulatory Visit
Admission: RE | Admit: 2015-10-18 | Discharge: 2015-10-18 | Disposition: A | Discharge status: Home / Self Care | Payer: Medicare PPO | Source: Ambulatory Visit | Attending: Internal Medicine | Admitting: Internal Medicine

## 2015-10-18 DIAGNOSIS — K449 Diaphragmatic hernia without obstruction or gangrene: Secondary | ICD-10-CM | POA: Diagnosis not present

## 2015-10-18 DIAGNOSIS — R590 Localized enlarged lymph nodes: Secondary | ICD-10-CM | POA: Insufficient documentation

## 2015-10-18 DIAGNOSIS — C269 Malignant neoplasm of ill-defined sites within the digestive system: Secondary | ICD-10-CM | POA: Insufficient documentation

## 2015-10-18 LAB — GLUCOSE, CAPILLARY: GLUCOSE-CAPILLARY: 78 mg/dL (ref 65–99)

## 2015-10-18 MED ORDER — TECHNETIUM TC 99M TETROFOSMIN IV KIT
12.7980 | PACK | Freq: Once | INTRAVENOUS | Status: AC
  Administered 2015-10-18: 12.798 via INTRAVENOUS

## 2015-10-21 ENCOUNTER — Other Ambulatory Visit: Payer: Self-pay | Admitting: *Deleted

## 2015-10-21 ENCOUNTER — Telehealth: Payer: Self-pay | Admitting: *Deleted

## 2015-10-21 DIAGNOSIS — C49A0 Gastrointestinal stromal tumor, unspecified site: Secondary | ICD-10-CM

## 2015-10-21 MED ORDER — SUNITINIB MALATE 25 MG PO CAPS: ORAL_CAPSULE | ORAL | 6 refills | Status: DC

## 2015-10-21 NOTE — Telephone Encounter (Signed)
Pt contacted RN back. She only missed 1 dose of the Sutent on 7/18. "I just forgot to take this medication that one day. I thought I would like you know. I have 1 pill left in my packet then I go on treatment break. I assume that I would finish this last pill." I asked pt to finish her dosing of sutent. She is scheduled to see Dr. Rogue Bussing on Friday. After today, she will be on treatment break for 1 week. She will need a new RX faxed to [place] to RF her drug. She has pt financial assistance with [employer].  rx may be faxed to [place] at 1 [phone]

## 2015-10-21 NOTE — Telephone Encounter (Signed)
Contacted pt back. Pt is on oral Sutent for GIST. Left msg asking pt to contact our cancer center back as soon as possible to discuss the missed dosing.

## 2015-10-21 NOTE — Telephone Encounter (Signed)
-----   Message from Cephus Richer sent at 10/21/2015  8:18 AM EDT ----- Contact: [phone] Please  Call pt she has a question about her med. She missed a pill now need to know if she needs to take it or not?

## 2015-10-24 ENCOUNTER — Telehealth: Payer: Self-pay | Admitting: *Deleted

## 2015-10-24 NOTE — Telephone Encounter (Signed)
Called to request Courtney [name] check on her medicine, she is to restart it Monday and has not heard from pharmacy yet

## 2015-10-24 NOTE — Telephone Encounter (Signed)
Called [facility] and spoke to [place] and he states that rx been put on hold because they do not have physical address of pt.  On file is P O BOx #.  I called pt and told her we needed physical address :  [place] [address]  I called back and spoke to [place] and gave him her physical address and they will send it to pharmacy to overnight delivery before Monday.  I told pt on the phone that is alll they were working on and it will be shipped to her house. She did want me to verify it was free drug and I told her that I read the letter and it is free thru dec of this year

## 2015-10-25 ENCOUNTER — Inpatient Hospital Stay (HOSPITAL_BASED_OUTPATIENT_CLINIC_OR_DEPARTMENT_OTHER): Payer: Medicare PPO | Admitting: Internal Medicine

## 2015-10-25 ENCOUNTER — Inpatient Hospital Stay: Payer: Medicare PPO | Attending: Internal Medicine

## 2015-10-25 ENCOUNTER — Other Ambulatory Visit: Payer: Self-pay | Admitting: *Deleted

## 2015-10-25 VITALS — BP 153/78 | HR 84 | Temp 97.2°F | Wt 131.6 lb

## 2015-10-25 DIAGNOSIS — M1990 Unspecified osteoarthritis, unspecified site: Secondary | ICD-10-CM

## 2015-10-25 DIAGNOSIS — C786 Secondary malignant neoplasm of retroperitoneum and peritoneum: Secondary | ICD-10-CM

## 2015-10-25 DIAGNOSIS — Z880 Allergy status to penicillin: Secondary | ICD-10-CM | POA: Diagnosis not present

## 2015-10-25 DIAGNOSIS — E876 Hypokalemia: Secondary | ICD-10-CM | POA: Diagnosis not present

## 2015-10-25 DIAGNOSIS — K219 Gastro-esophageal reflux disease without esophagitis: Secondary | ICD-10-CM

## 2015-10-25 DIAGNOSIS — Z803 Family history of malignant neoplasm of breast: Secondary | ICD-10-CM | POA: Diagnosis not present

## 2015-10-25 DIAGNOSIS — C269 Malignant neoplasm of ill-defined sites within the digestive system: Secondary | ICD-10-CM

## 2015-10-25 DIAGNOSIS — Z79899 Other long term (current) drug therapy: Secondary | ICD-10-CM | POA: Diagnosis not present

## 2015-10-25 DIAGNOSIS — C49A4 Gastrointestinal stromal tumor of large intestine: Secondary | ICD-10-CM | POA: Insufficient documentation

## 2015-10-25 DIAGNOSIS — I10 Essential (primary) hypertension: Secondary | ICD-10-CM | POA: Diagnosis not present

## 2015-10-25 DIAGNOSIS — K1230 Oral mucositis (ulcerative), unspecified: Secondary | ICD-10-CM | POA: Insufficient documentation

## 2015-10-25 DIAGNOSIS — C49A0 Gastrointestinal stromal tumor, unspecified site: Secondary | ICD-10-CM

## 2015-10-25 LAB — COMPREHENSIVE METABOLIC PANEL
ALK PHOS: 40 U/L (ref 38–126)
ALT: 15 U/L (ref 14–54)
AST: 17 U/L (ref 15–41)
Albumin: 4 g/dL (ref 3.5–5.0)
Anion gap: 5 (ref 5–15)
BILIRUBIN TOTAL: 0.7 mg/dL (ref 0.3–1.2)
BUN: 17 mg/dL (ref 6–20)
CALCIUM: 8.8 mg/dL — AB (ref 8.9–10.3)
CHLORIDE: 108 mmol/L (ref 101–111)
CO2: 25 mmol/L (ref 22–32)
CREATININE: 0.77 mg/dL (ref 0.44–1.00)
Glucose, Bld: 109 mg/dL — ABNORMAL HIGH (ref 65–99)
Potassium: 3.3 mmol/L — ABNORMAL LOW (ref 3.5–5.1)
Sodium: 138 mmol/L (ref 135–145)
TOTAL PROTEIN: 6.3 g/dL — AB (ref 6.5–8.1)

## 2015-10-25 LAB — CBC WITH DIFFERENTIAL/PLATELET
Basophils Absolute: 0 10*3/uL (ref 0–0.1)
Basophils Relative: 0 %
EOS PCT: 2 %
Eosinophils Absolute: 0.1 10*3/uL (ref 0–0.7)
HEMATOCRIT: 35.3 % (ref 35.0–47.0)
Hemoglobin: 12.6 g/dL (ref 12.0–16.0)
LYMPHS ABS: 1.1 10*3/uL (ref 1.0–3.6)
LYMPHS PCT: 30 %
MCH: 38 pg — AB (ref 26.0–34.0)
MCHC: 35.8 g/dL (ref 32.0–36.0)
MCV: 106.2 fL — AB (ref 80.0–100.0)
Monocytes Absolute: 0.2 10*3/uL (ref 0.2–0.9)
Monocytes Relative: 6 %
NEUTROS ABS: 2.2 10*3/uL (ref 1.4–6.5)
Neutrophils Relative %: 62 %
PLATELETS: 195 10*3/uL (ref 150–440)
RBC: 3.32 MIL/uL — AB (ref 3.80–5.20)
RDW: 13.9 % (ref 11.5–14.5)
WBC: 3.6 10*3/uL (ref 3.6–11.0)

## 2015-10-25 NOTE — Assessment & Plan Note (Signed)
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 Clinically no evidence of progression noted- patient tolerating Sutent well. PET July 27th- significnat response.   # Mucositis- [facility] magic mouth wash; use salt-baking soda rinses.   # mild hypokalemia- K- 3.3- K 20 me Q once a day.   # # I reviewed the blood work- with the patient in detail; also reviewed the imaging independently [as summarized above]; and with the patient in detail.   # 25 minutes face-to-face with the patient discussing the above plan of care; more than 50% of time spent on prognosis/ natural history; counseling and coordination.

## 2015-10-25 NOTE — Progress Notes (Signed)
[facility] OFFICE PROGRESS NOTE  Patient Care Team: Rusty Aus, MD as PCP - General (Internal Medicine)  No matching staging information was found for the patient.   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 Prognostic/predictive value]     Malignant [place]oplasm of gastrointestinal tract ([facility]   04/04/2014 Initial Diagnosis    Malignant [place]oplasm of gastrointestinal tract ([facility]       INTERVAL HISTORY:  Courtney [name] 75y.o.  female pleasant patient above history of gist metastatic- is here for follow-up patient is currently on sunitinib since March 2017/ 79 reviewed the results of her restaging PET scan.  Patient admits to good appetite. She is gaining weight. She is very active. No nausea no vomiting. Denies any headaches denies any rash palms and soles. No chest pain or shortness of breath or cough. Denies abdominal pain.   Complains of mild soreness in the mouth; she recently bought baking soda. Not using it yet.   REVIEW OF SYSTEMS:  A complete 10 point review of system is done which is negative except mentioned above/history of present illness.   PAST MEDICAL HISTORY :  Past Medical History:   Diagnosis Date  . Arthritis   . Cancer ([facility]    GIST  . GERD (gastroesophageal reflux disease)   . GIST (gastrointestinal stroma tumor), malignant, colon ([facility]   . History of hiatal hernia   . Hypertension    NO MEDS NOW  . Tremors of nervous system    HEAD    PAST SURGICAL HISTORY :   Past Surgical History:  Procedure Laterality Date  . ABDOMINAL HYSTERECTOMY    . BREAST BIOPSY Right   . CATARACT EXTRACTION W/PHACO Right 01/28/2015   Procedure: CATARACT EXTRACTION PHACO AND INTRAOCULAR LENS PLACEMENT (IOC);  Surgeon: SEstill Cotta MD;  Location: [facility] ORS;  Service: Ophthalmology;  Laterality: Right;  [place]00:59.3 AP% 22.6 CDE 24.69 fluid pack lot ##[id number]  . CATARACT EXTRACTION W/PHACO Left 02/11/2015   Procedure: CATARACT EXTRACTION PHACO AND INTRAOCULAR LENS PLACEMENT (IOC);  Surgeon: SEstill Cotta MD;  Location: [facility] ORS;  Service: Ophthalmology;  Laterality: Left;  [place]01:18 AP% 22.8 CDE 32.38  fluid pack lot # [id number]  . COLON SURGERY     COLOSTOMY AND REVERSAL  . STOMACH SURGERY     CANCER    FAMILY HISTORY :   Family History  Problem Relation Age of Onset  . Breast cancer Maternal Grandmother     SOCIAL HISTORY:   Social History  Substance Use Topics  . Smoking status: Never Smoker  . Smokeless tobacco: Not on file  . Alcohol use No    ALLERGIES:  is allergic to  penicillins.  MEDICATIONS:  Current Outpatient Prescriptions  Medication Sig Dispense Refill  . calcium carbonate (TUMS - DOSED IN MG ELEMENTAL CALCIUM) 500 MG chewable tablet Chew 1 tablet by mouth daily.    . Cetirizine HCl (ZYRTEC PO) Take by mouth.    . Cyanocobalamin (RA VITAMIN B-12 TR) 1000 MCG TBCR Take by mouth.    . Iron-Vitamin C (VITRON-C) 65-125 MG TABS Take by mouth.    . megestrol (MEGACE) 40 MG tablet TAKE ONE TABLET BY MOUTH EVERY DAY FOR APPETITE  2  . Multiple Vitamin (MULTIVITAMIN) tablet Take 1 tablet by mouth daily.    . potassium chloride SA (K-DUR,KLOR-CON)  20 MEQ tablet Take 1 tablet (20 mEq total) by mouth daily. 30 tablet 0  . ranitidine (ZANTAC) 150 MG tablet Take by mouth.    . SUNItinib (SUTENT) 25 MG capsule Take 1 capsule by mouth daily for 21 days then 7 days off. 21 capsule 6  . mometasone (NASONEX) 50 MCG/ACT nasal spray 50 sprays.     No current facility-administered medications for this visit.     PHYSICAL EXAMINATION: ECOG PERFORMANCE STATUS: 0 - Asymptomatic  BP (!) 153/78 (BP Location: Left Arm, Patient Position: Sitting)   Pulse 84   Temp 97.2 F (36.2 C) (Tympanic)   Wt 131 lb 9.6 oz (59.7 kg)   BMI 26.58 kg/m   Filed Weights   10/25/15 1048  Weight: 131 lb 9.6 oz (59.7 kg)    GENERAL: Thin built moderately nourished; Caucasian female patient Alert, no distress and comfortable. Accompanied by son.  EYES: no pallor or icterus OROPHARYNX: no thrush or ulceration; poor dentition  NECK: supple, no masses felt LYMPH:  no palpable lymphadenopathy in the cervical, axillary or inguinal regions LUNGS: clear to auscultation and  No wheeze or crackles HEART/CVS: regular rate & rhythm and no murmurs; No lower extremity edema ABDOMEN:abdomen soft, non-tender and normal bowel sounds Musculoskeletal:no cyanosis of digits and no clubbing  PSYCH: alert & oriented x 3 with fluent speech NEURO: no focal motor/sensory deficits SKIN:  no rashes or significant lesions  LABORATORY DATA:  I have reviewed the data as listed    Component Value Date/Time   NA 138 10/25/2015 1015   NA 140 07/12/2014 0902   K 3.3 (L) 10/25/2015 1015   K 3.1 (L) 07/12/2014 0902   CL 108 10/25/2015 1015   CL 104 07/12/2014 0902   CO2 25 10/25/2015 1015   CO2 31 07/12/2014 0902   GLUCOSE 109 (H) 10/25/2015 1015   GLUCOSE 100 (H) 07/12/2014 0902   BUN 17 10/25/2015 1015   BUN 13 07/12/2014 0902   CREATININE 0.77 10/25/2015 1015   CREATININE 0.49 07/12/2014 0902   CALCIUM 8.8 (L) 10/25/2015 1015   CALCIUM 9.1 07/12/2014 0902   PROT 6.3 (L)  10/25/2015 1015   PROT 6.8 07/12/2014 0902   ALBUMIN 4.0 10/25/2015 1015   ALBUMIN 3.3 (L) 07/12/2014 0902   AST 17 10/25/2015 1015   AST 15 07/12/2014 0902   ALT 15 10/25/2015 1015   ALT 11 (L) 07/12/2014 0902   ALKPHOS 40 10/25/2015 1015   ALKPHOS 73 07/12/2014 0902   BILITOT 0.7 10/25/2015 1015   BILITOT 0.5 07/12/2014 0902   GFRNONAA >60 10/25/2015 1015   GFRNONAA >60 07/12/2014 0902   GFRAA >60 10/25/2015 1015   GFRAA >60 07/12/2014 0902    No results found for: SPEP, UPEP  Lab Results  Component Value Date   WBC 3.6 10/25/2015   NEUTROABS 2.2  10/25/2015   HGB 12.6 10/25/2015   HCT 35.3 10/25/2015   MCV 106.2 (H) 10/25/2015   PLT 195 10/25/2015      Chemistry      Component Value Date/Time   NA 138 10/25/2015 1015   NA 140 07/12/2014 0902   K 3.3 (L) 10/25/2015 1015   K 3.1 (L) 07/12/2014 0902   CL 108 10/25/2015 1015   CL 104 07/12/2014 0902   CO2 25 10/25/2015 1015   CO2 31 07/12/2014 0902   BUN 17 10/25/2015 1015   BUN 13 07/12/2014 0902   CREATININE 0.77 10/25/2015 1015   CREATININE 0.49 07/12/2014 0902      Component Value Date/Time   CALCIUM 8.8 (L) 10/25/2015 1015   CALCIUM 9.1 07/12/2014 0902   ALKPHOS 40 10/25/2015 1015   ALKPHOS 73 07/12/2014 0902   AST 17 10/25/2015 1015   AST 15 07/12/2014 0902   ALT 15 10/25/2015 1015   ALT 11 (L) 07/12/2014 0902   BILITOT 0.7 10/25/2015 1015   BILITOT 0.5 07/12/2014 0902       RADIOGRAPHIC STUDIES: I have personally reviewed the radiological images as listed and agreed with the findings in the report. No results found.   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 Clinically no evidence of progression noted- patient tolerating Sutent well. PET July 27th- significnat response.   # Mucositis- [facility] magic mouth wash; use salt-baking soda rinses.   # mild hypokalemia-  K- 3.3- K 20 me Q once a day.   # # I reviewed the blood work- with the patient in detail; also reviewed the imaging independently [as summarized above]; and with the patient in detail.   # 25 minutes face-to-face with the patient discussing the above plan of care; more than 50% of time spent on prognosis/ natural history; counseling and coordination.   No orders of the defined types were placed in this encounter.  All questions were answered. The patient knows to call the clinic with any problems, questions or concerns.      Cammie Sickle, MD 10/25/2015 1:19 PM

## 2015-10-25 NOTE — Progress Notes (Signed)
Patient ambulates without assistance, brought to exam room 17, accompanied by her son.  Patient denied pain or discomfort at this time.  BP 153/78, HR 84 Temp 97.2, vitals documented.

## 2015-11-12 ENCOUNTER — Other Ambulatory Visit: Payer: Self-pay | Admitting: *Deleted

## 2015-11-12 DIAGNOSIS — C49A0 Gastrointestinal stromal tumor, unspecified site: Secondary | ICD-10-CM

## 2015-11-12 MED ORDER — SUNITINIB MALATE 25 MG PO CAPS: ORAL_CAPSULE | ORAL | 6 refills | Status: DC

## 2015-11-12 NOTE — Telephone Encounter (Signed)
[facility] needs new prescription with current MD e scribed to them [facility] if patient is to continue taking Sutent.

## 2015-11-17 IMAGING — CT CT ABD-PELV W/ CM
2 of 5 series · 15 of 46 positions shown, 17 images · IV contrast (omnipaque)
Comparison: None.

CLINICAL DATA: Restaging GIST common post resection on [DATE] with
colostomy status post reversal, on oral chemotherapy

EXAM:
CT ABDOMEN AND PELVIS WITH CONTRAST
TECHNIQUE: Multidetector CT imaging of the abdomen and pelvis was performed
using the standard protocol following bolus administration of
intravenous contrast.
CONTRAST:  100mL OMNIPAQUE IOHEXOL 300 MG/ML  SOLN

[Series 2: routine with · axial · 0.64mm/px · z∈[-966,-571]mm · 12 of 89 slices shown, 14 images]
[im 5/89  soft-tissue]
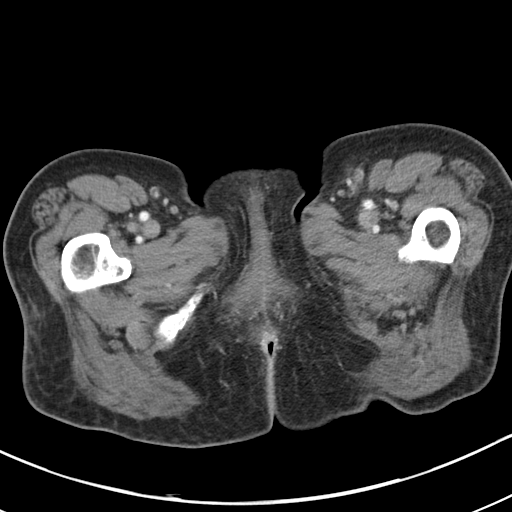
[im 5/89  bone]
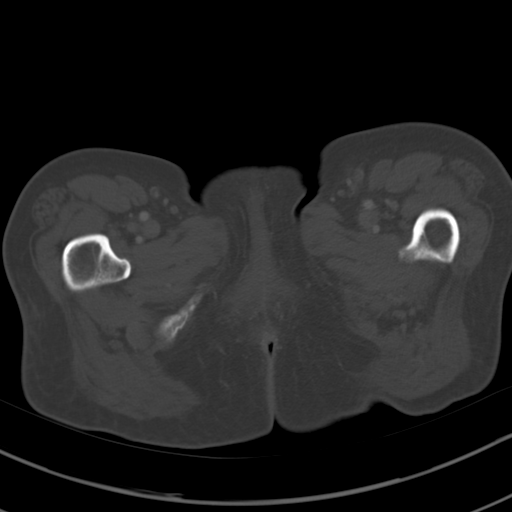
[im 14/89  soft-tissue]
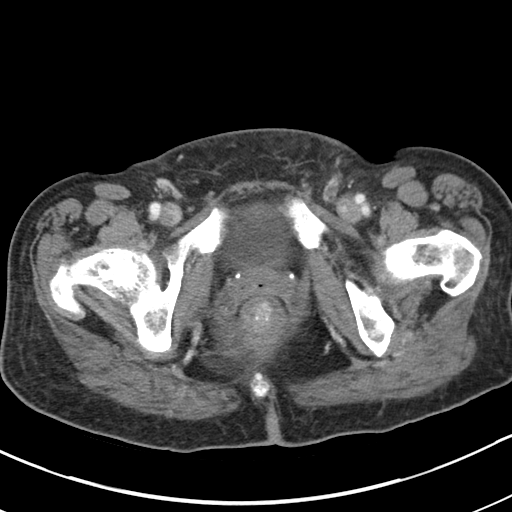
[im 19/89  soft-tissue]
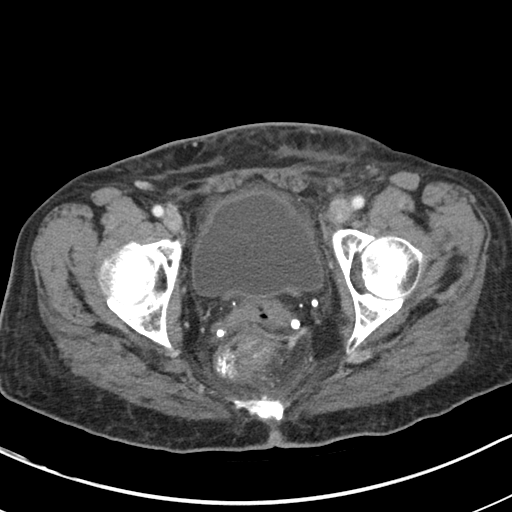
[im 28/89  soft-tissue]
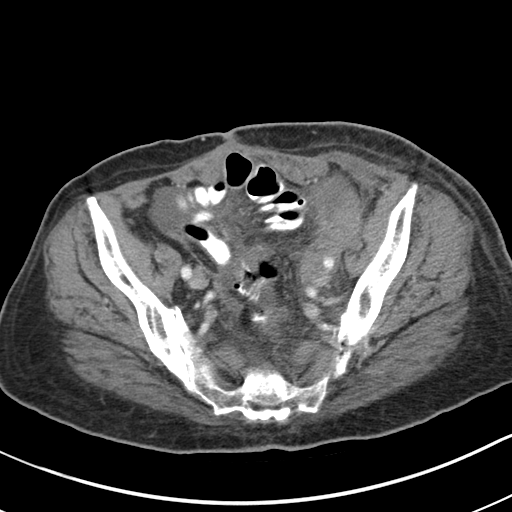
[im 33/89  soft-tissue]
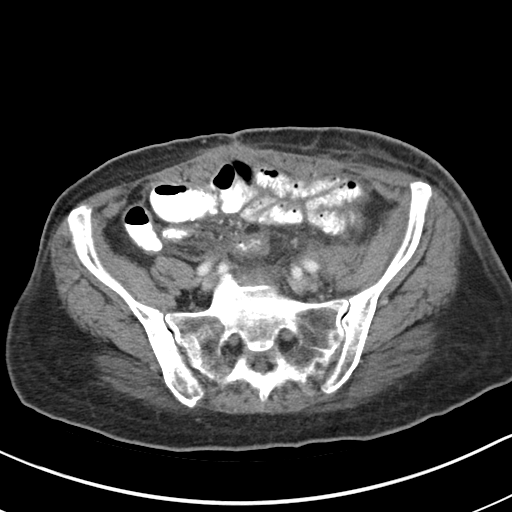
[im 42/89  soft-tissue]
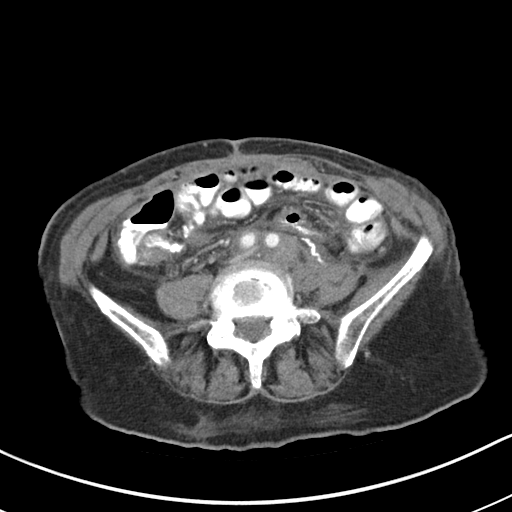
[im 47/89  soft-tissue]
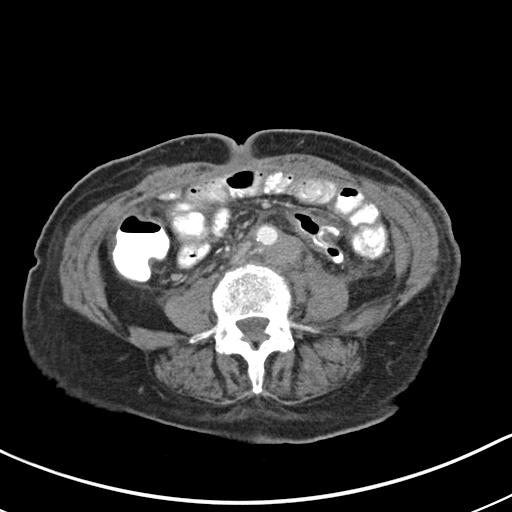
[im 56/89  soft-tissue]
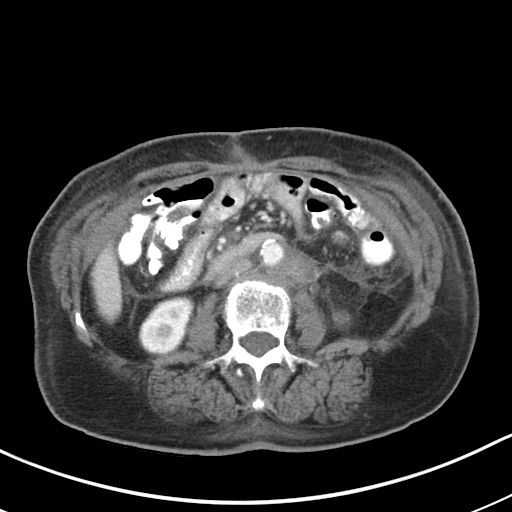
[im 61/89  soft-tissue]
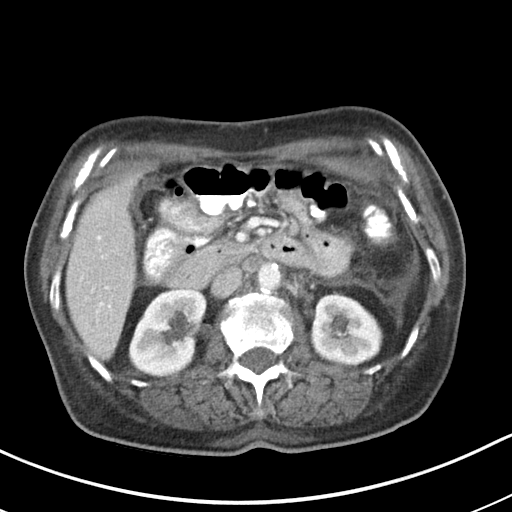
[im 61/89  bone]
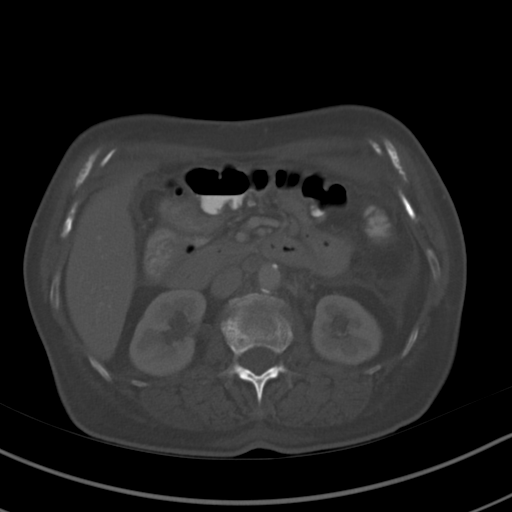
[im 70/89  soft-tissue]
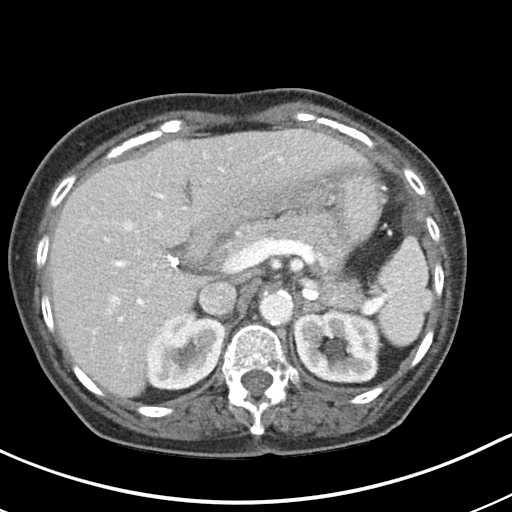
[im 75/89  soft-tissue]
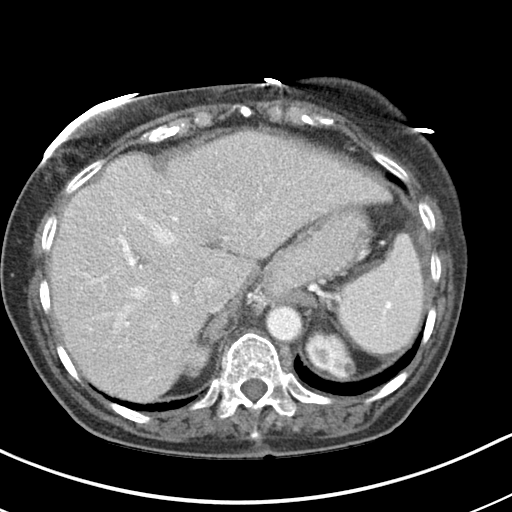
[im 84/89  soft-tissue]
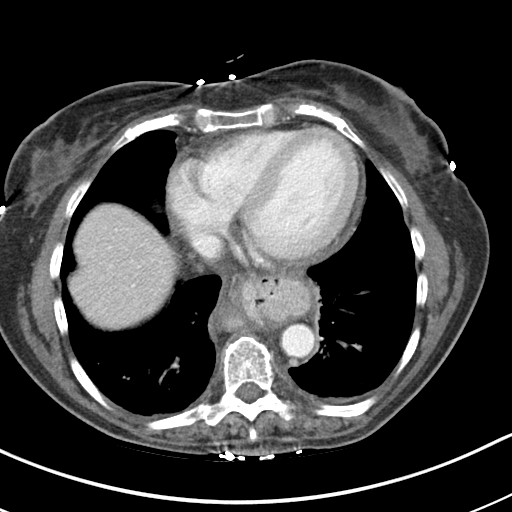

[Series 5: cor routine with · coronal · 0.69mm/px · 3 of 133 slices shown]
[im 45/133  soft-tissue]
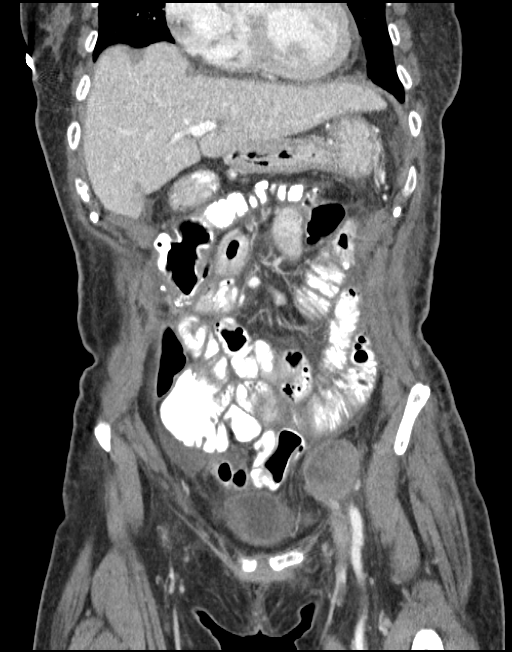
[im 59/133  soft-tissue]
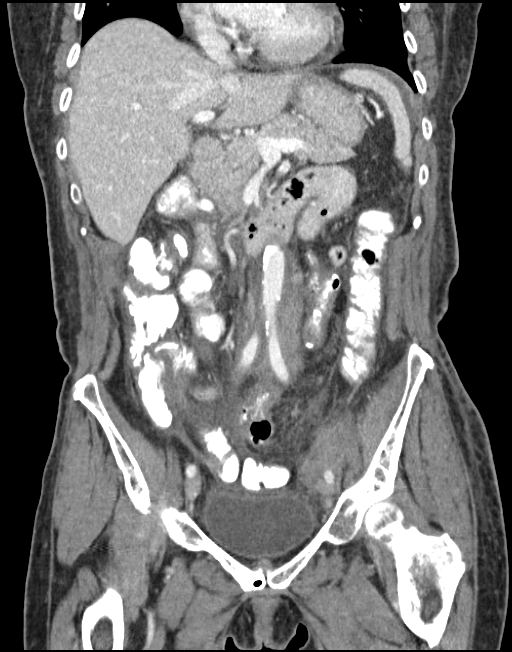
[im 74/133  soft-tissue]
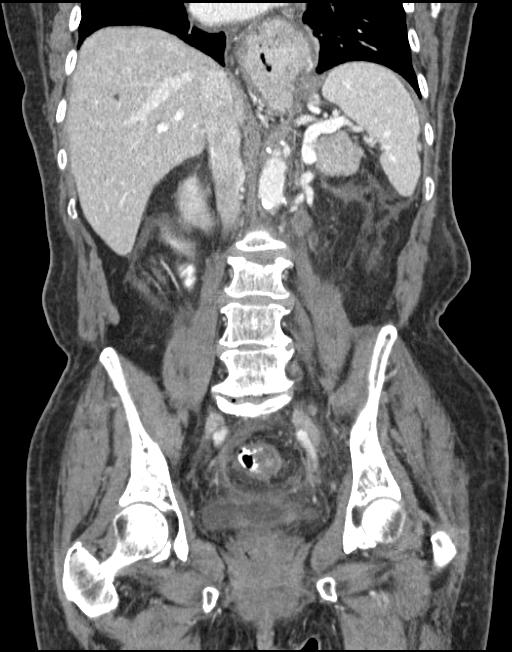

[15 of 46 positions shown; findings below may reference images not displayed]

FINDINGS: Lower chest: Mild dependent atelectasis in the bilateral lower
lobes.

The heart is top-normal in size.

Hepatobiliary: Liver is within normal limits. No
suspicious/enhancing hepatic lesions.

Status post cholecystectomy. No intrahepatic or extrahepatic ductal
dilatation.

Pancreas: Within normal limits.

Spleen: Within normal limits.

Adrenals/Urinary Tract: Adrenal glands are within normal limits.

Kidneys are within normal limits.  No hydronephrosis.

Bladder is within normal limits.

Stomach/Bowel: Stomach is notable for a moderate hiatal hernia/
inverted intrathoracic stomach.

Status post resection of prior left mid abdominal/pericolonic mass,
corresponding to known GIST tumor. Status post left hemicolectomy
with suture line in the left mid abdomen (series 2/ image 47).

No evidence of bowel obstruction.

Normal appendix (series 2/image 54).

Vascular/Lymphatic: Atherosclerotic calcifications of the abdominal
aorta and branch vessels.

Retroperitoneal/left pelvic lymphadenopathy, including:

--9 mm short axis aortocaval node (series 2/ image 29)

--1.6 cm short axis left para-aortic node (series 2/ image 32)

--2.9 cm left para-aortic node (series 2/ image 38)

--2.9 cm short axis left common iliac node (series 2/ image 51)

--3.8 cm short axis left external iliac node (series 2/image 64)

Reproductive: Status post hysterectomy.

No adnexal masses.

Other: Small volume abdominopelvic ascites with scattered areas of
mesenteric fluid/ stranding.

Musculoskeletal: Degenerative changes of the visualized
thoracolumbar spine.

Mild superior endplate changes at L3, new.
IMPRESSION: Status post resection on prior left mid abdominal/pericolonic mass,
corresponding to known GIST tumor. Status post left hemicolectomy.

Progression of retroperitoneal/left pelvic lymphadenopathy,
suspicious for nodal metastases, as above.

Additional ancillary findings as above.

## 2015-11-18 ENCOUNTER — Other Ambulatory Visit: Payer: Self-pay | Admitting: *Deleted

## 2015-11-18 DIAGNOSIS — C49A0 Gastrointestinal stromal tumor, unspecified site: Secondary | ICD-10-CM

## 2015-11-18 MED ORDER — SUNITINIB MALATE 25 MG PO CAPS: ORAL_CAPSULE | ORAL | 6 refills | Status: DC

## 2015-11-22 ENCOUNTER — Inpatient Hospital Stay: Payer: Medicare PPO | Attending: Internal Medicine

## 2015-11-22 ENCOUNTER — Inpatient Hospital Stay (HOSPITAL_BASED_OUTPATIENT_CLINIC_OR_DEPARTMENT_OTHER): Payer: Medicare PPO | Admitting: Internal Medicine

## 2015-11-22 VITALS — BP 161/89 | HR 89 | Temp 95.0°F | Resp 18 | Wt 130.1 lb

## 2015-11-22 DIAGNOSIS — M1990 Unspecified osteoarthritis, unspecified site: Secondary | ICD-10-CM | POA: Diagnosis not present

## 2015-11-22 DIAGNOSIS — K1230 Oral mucositis (ulcerative), unspecified: Secondary | ICD-10-CM | POA: Diagnosis not present

## 2015-11-22 DIAGNOSIS — C269 Malignant neoplasm of ill-defined sites within the digestive system: Secondary | ICD-10-CM

## 2015-11-22 DIAGNOSIS — K219 Gastro-esophageal reflux disease without esophagitis: Secondary | ICD-10-CM

## 2015-11-22 DIAGNOSIS — Z79899 Other long term (current) drug therapy: Secondary | ICD-10-CM

## 2015-11-22 DIAGNOSIS — C786 Secondary malignant neoplasm of retroperitoneum and peritoneum: Secondary | ICD-10-CM

## 2015-11-22 DIAGNOSIS — C49A4 Gastrointestinal stromal tumor of large intestine: Secondary | ICD-10-CM | POA: Insufficient documentation

## 2015-11-22 DIAGNOSIS — R197 Diarrhea, unspecified: Secondary | ICD-10-CM | POA: Insufficient documentation

## 2015-11-22 DIAGNOSIS — C49A0 Gastrointestinal stromal tumor, unspecified site: Secondary | ICD-10-CM

## 2015-11-22 DIAGNOSIS — I10 Essential (primary) hypertension: Secondary | ICD-10-CM

## 2015-11-22 DIAGNOSIS — R51 Headache: Secondary | ICD-10-CM

## 2015-11-22 LAB — CBC WITH DIFFERENTIAL/PLATELET
BASOS ABS: 0 10*3/uL (ref 0–0.1)
EOS ABS: 0 10*3/uL (ref 0–0.7)
HEMATOCRIT: 37.5 % (ref 35.0–47.0)
HEMOGLOBIN: 13.5 g/dL (ref 12.0–16.0)
LYMPHS ABS: 1.4 10*3/uL (ref 1.0–3.6)
Lymphocytes Relative: 28 %
MCH: 38.8 pg — ABNORMAL HIGH (ref 26.0–34.0)
MCHC: 36.1 g/dL — ABNORMAL HIGH (ref 32.0–36.0)
MCV: 107.6 fL — AB (ref 80.0–100.0)
Monocytes Absolute: 0.3 10*3/uL (ref 0.2–0.9)
Neutro Abs: 3.3 10*3/uL (ref 1.4–6.5)
Neutrophils Relative %: 64 %
Platelets: 223 10*3/uL (ref 150–440)
RBC: 3.49 MIL/uL — AB (ref 3.80–5.20)
RDW: 13.4 % (ref 11.5–14.5)
WBC: 5.1 10*3/uL (ref 3.6–11.0)

## 2015-11-22 LAB — COMPREHENSIVE METABOLIC PANEL
ALBUMIN: 4.4 g/dL (ref 3.5–5.0)
ALK PHOS: 39 U/L (ref 38–126)
ALT: 17 U/L (ref 14–54)
AST: 20 U/L (ref 15–41)
Anion gap: 7 (ref 5–15)
BILIRUBIN TOTAL: 0.9 mg/dL (ref 0.3–1.2)
BUN: 19 mg/dL (ref 6–20)
CO2: 23 mmol/L (ref 22–32)
CREATININE: 0.89 mg/dL (ref 0.44–1.00)
Calcium: 9.2 mg/dL (ref 8.9–10.3)
Chloride: 107 mmol/L (ref 101–111)
GFR calc Af Amer: >60 mL/min (ref >60)
GLUCOSE: 95 mg/dL (ref 65–99)
Potassium: 3.7 mmol/L (ref 3.5–5.1)
Sodium: 137 mmol/L (ref 135–145)
TOTAL PROTEIN: 6.7 g/dL (ref 6.5–8.1)

## 2015-11-22 NOTE — Assessment & Plan Note (Signed)
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 Clinically no evidence of progression noted- patient tolerating Sutent well- except AEs as discussed below.  PET July 27th- significnat response.   # recommend sutent 25 mg 2 weeks ON; 1 week OFF; start in 1 week.   # Mucositis-recommend duke magic mouth wash; use salt-baking soda rinses.   # diarrhea- 2-3 weeks;sec to sutent;  Imodium prn.   # elevated BP- sec to sutent/ recommend stop sudafed.   # follow up in 4 weeks/ labs.

## 2015-11-22 NOTE — Patient Instructions (Addendum)
1. Please take over the counter imodium for diarrhea.    2. Use magic mouthwash for the mouthsores.   You will need to swish and swallow this medication four times a day.     3. Do not take Sudafed as this is a risk factor for increase blood pressure   4.  Regarding your Sutent dosing:  Do not start taking your Sutent  until November 29, 2015   Your new treatment schedule will be as follows: Sutent 25 mg daily x 2 weeks only and then hold for 2 weeks.         Diarrhea Diarrhea is frequent loose and watery bowel movements. It can cause you to feel weak and dehydrated. Dehydration can cause you to become tired and thirsty, have a dry mouth, and have decreased urination that often is dark yellow. Diarrhea is a sign of another problem, most often an infection that will not last long. In most cases, diarrhea typically lasts 2-3 days. However, it can last longer if it is a sign of something more serious. It is important to treat your diarrhea as directed by your caregiver to lessen or prevent future episodes of diarrhea. CAUSES  Some common causes include:  Gastrointestinal infections caused by viruses, bacteria, or parasites.  Food poisoning or food allergies.  Certain medicines, such as antibiotics, chemotherapy, and laxatives.  Artificial sweeteners and fructose.  Digestive disorders. HOME CARE INSTRUCTIONS  Ensure adequate fluid intake (hydration): Have 1 cup (8 oz) of fluid for each diarrhea episode. Avoid fluids that contain simple sugars or sports drinks, fruit juices, whole milk products, and sodas. Your urine should be clear or pale yellow if you are drinking enough fluids. Hydrate with an oral rehydration solution that you can purchase at pharmacies, retail stores, and online. You can prepare an oral rehydration solution at home by mixing the following ingredients together:   - tsp table salt.   tsp baking soda.   tsp salt substitute containing potassium  chloride.  1  tablespoons sugar.  1 L (34 oz) of water.  Certain foods and beverages may increase the speed at which food moves through the gastrointestinal (GI) tract. These foods and beverages should be avoided and include:  Caffeinated and alcoholic beverages.  High-fiber foods, such as raw fruits and vegetables, nuts, seeds, and whole grain breads and cereals.  Foods and beverages sweetened with sugar alcohols, such as xylitol, sorbitol, and mannitol.  Some foods may be well tolerated and may help thicken stool including:  Starchy foods, such as rice, toast, pasta, low-sugar cereal, oatmeal, grits, baked potatoes, crackers, and bagels.  Bananas.  Applesauce.  Add probiotic-rich foods to help increase healthy bacteria in the GI tract, such as yogurt and fermented milk products.  Wash your hands well after each diarrhea episode.  Only take over-the-counter or prescription medicines as directed by your caregiver.  Take a warm bath to relieve any burning or pain from frequent diarrhea episodes. SEEK IMMEDIATE MEDICAL CARE IF:   You are unable to keep fluids down.  You have persistent vomiting.  You have blood in your stool, or your stools are black and tarry.  You do not urinate in 6-8 hours, or there is only a small amount of very dark urine.  You have abdominal pain that increases or localizes.  You have weakness, dizziness, confusion, or light-headedness.  You have a severe headache.  Your diarrhea gets worse or does not get better.  You have a fever or persistent symptoms  for more than 2-3 days.  You have a fever and your symptoms suddenly get worse. MAKE SURE YOU:   Understand these instructions.  Will watch your condition.  Will get help right away if you are not doing well or get worse.   This information is not intended to replace advice given to you by your health care provider. Make sure you discuss any questions you have with your health care  provider.   Document Released: 02/27/2002 Document Revised: 03/30/2014 Document Reviewed: 11/15/2011 Elsevier Interactive Patient Education [employer].

## 2015-11-22 NOTE — Progress Notes (Signed)
[name] OFF[facility] PROGRESS NOTE  Patient Care Team: Rusty Aus, MD as PCP - General (Internal Medicine)  No matching staging information was found for the patient.   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 Prognostic/predictive value]     [place]   04/04/[place] Initial Diagnosis    Malignant neoplasm of gastrointestinal tract ([facility]        INTERVAL HISTORY:  Courtney PangilinanHorner 75y.o.  female pleasant patient above history of gist metastatic- is here for follow-up patient is currently on sunitinib since March 2017; With excellent response on a PET scan in July 2017 is here for follow-up.  Patient complains of soreness in the mouth. She had not been using Magic mouthwash as recommended. Complains of intermittent headaches. She started taking Sudafed for "diarrhea". Also complains of 2-3 large loose stools everyday. Denies any rash palms and soles. No chest pain or shortness of breath or cough. Denies abdominal pain.    REVIEW OF SYSTEMS:  A complete 10 point review of system is done which is negative except mentioned above/history of present illness.   PAST  MEDICAL HISTORY :  Past Medical History:  Diagnosis Date  . Arthritis   . Cancer ([facility]    GIST  . GERD (gastroesophageal reflux disease)   . [place] (gastrointestinal stroma tumor), [place]   . History of hiatal hernia   . Hypertension    NO MEDS NOW  . Tremors of nervous system    HEAD    PAST SURGICAL HISTORY :   Past Surgical History:  Procedure Laterality Date  . ABDOMINAL HYSTERECTOMY    . BREAST BIOPSY Right   . CATARACT EXTRACTION W/PHACO Right 01/28/2015   Procedure: CATARACT EXTRACTION PHACO AND INTRAOCULAR LENS PLACEMENT (IOC);  Surgeon: SEstill Cotta MD;  Location: [facility] ORS;  Service: Ophthalmology;  Laterality: Right;  [place]:59.3 AP% 22.6 CDE 24.69 fluid pack lot ##[id number]  . CATARACT EXTRACTION W/PHACO Left 02/11/2015   Procedure: CATARACT EXTRACTION PHACO AND INTRAOCULAR LENS PLACEMENT (IOC);  Surgeon: SEstill Cotta MD;  Location: [facility] ORS;  Service: Ophthalmology;  Laterality: Left;  [place]01:18 AP% 22.8 CDE 32.38  fluid pack lot # [id number]  . COLON SURGERY     COLOSTOMY AND REVERSAL  . STOMACH SURGERY     CANCER    FAMILY HISTORY :   Family History  Problem Relation Age of Onset  . Breast cancer Maternal Grandmother     SOCIAL HISTORY:   Social History  Substance Use Topics  . Smoking status: Never Smoker  . Smokeless tobacco: Not on file  . Alcohol use No    ALLERGIES:  is allergic to penicillins.  MEDICATIONS:  Current Outpatient Prescriptions  Medication Sig Dispense Refill  . calcium carbonate (TUMS - DOSED IN MG ELEMENTAL CALCIUM) 500 MG chewable tablet Chew 1 tablet by mouth daily.    . Cetirizine HCl (ZYRTEC PO) Take by mouth.    . Cyanocobalamin (RA VITAMIN B-12 TR) 1000 MCG TBCR Take by mouth.    . Iron-Vitamin C (VITRON-C) 65-125 MG TABS Take by mouth.    . megestrol (MEGACE) 40 MG tablet TAKE ONE TABLET BY MOUTH EVERY DAY FOR APPETITE  2  . mometasone (NASONEX) 50 MCG/ACT nasal spray 50 sprays.    . Multiple  Vitamin (MULTIVITAMIN) tablet Take 1 tablet by mouth daily.    . phenylephrine (SUDAFED PE) 10 MG TABS tablet Take 10 mg by mouth 2 (two) times daily.    . potassium chloride SA (K-DUR,KLOR-CON) 20 MEQ tablet Take 1 tablet (20 mEq total) by mouth daily. 30 tablet 0  . ranitidine (ZANTAC) 150 MG tablet Take by mouth.    . SUNItinib (SUTENT) 25 MG capsule Take 1 capsule by mouth daily for 21 days then 7 days off. 21 capsule 6   No current facility-administered medications for this visit.     PHYSICAL EXAMINATION: ECOG PERFORMANCE STATUS: 0 - Asymptomatic  BP (!) 161/89 (BP Location: Left Arm, Patient Position: Sitting)   Pulse 89   Temp (!) 95 F (35 C) (Tympanic)   Resp 18   Wt 130 lb 2 oz (59 kg)   BMI 26.28 kg/m   Filed Weights   11/22/15 1210  Weight: 130 lb 2 oz (59 kg)    GENERAL: Thin built moderately nourished; Caucasian female patient Alert, no distress and comfortable. Accompanied by son.  EYES: no pallor or icterus OROPHARYNX: no thrush or ulceration; poor dentition  NECK: supple, no masses felt LYMPH:  no palpable lymphadenopathy in the cervical, axillary or inguinal regions LUNGS: clear to auscultation and  No wheeze or crackles HEART/CVS: regular rate & rhythm and no murmurs; No lower extremity edema ABDOMEN:abdomen soft, non-tender and normal bowel sounds Musculoskeletal:no cyanosis of digits and no clubbing  PSYCH: alert & oriented x 3 with fluent speech NEURO: no focal motor/sensory deficits SKIN:  no rashes or significant lesions  LABORATORY DATA:  I have reviewed the data as listed    Component Value Date/Time   NA 137 11/22/2015 1113   NA 140 07/12/2014 0902   K 3.7 11/22/2015 1113   K 3.1 (L) 07/12/2014 0902   CL 107 11/22/2015 1113   CL 104 07/12/2014 0902   CO2 23 11/22/2015 1113   CO2 31 07/12/2014 0902   GLUCOSE 95 11/22/2015 1113   GLUCOSE 100 (H) 07/12/2014 0902   BUN 19 11/22/2015 1113   BUN 13 07/12/2014 0902   CREATININE 0.89  11/22/2015 1113   CREATININE 0.49 07/12/2014 0902   CALCIUM 9.2 11/22/2015 1113   CALCIUM 9.1 07/12/2014 0902   PROT 6.7 11/22/2015 1113   PROT 6.8 07/12/2014 0902   ALBUMIN 4.4 11/22/2015 1113   ALBUMIN 3.3 (L) 07/12/2014 0902   AST 20 11/22/2015 1113   AST 15 07/12/2014 0902   ALT 17 11/22/2015 1113   ALT 11 (L) 07/12/2014 0902   ALKPHOS 39 11/22/2015 1113   ALKPHOS 73 07/12/2014 0902   BILITOT 0.9 11/22/2015 1113   BILITOT 0.5 07/12/2014 0902   GFRNONAA >60 11/22/2015 1113   GFRNONAA >60 07/12/2014 0902   GFRAA >60 11/22/2015 1113   GFRAA >60 07/12/2014 0902  No results found for: SPEP, UPEP  Lab Results  Component Value Date   WBC 5.1 11/22/2015   NEUTROABS 3.3 11/22/2015   HGB 13.5 11/22/2015   HCT 37.5 11/22/2015   MCV 107.6 (H) 11/22/2015   PLT 223 11/22/2015      Chemistry      Component Value Date/Time   NA 137 11/22/2015 1113   NA 140 07/12/2014 0902   K 3.7 11/22/2015 1113   K 3.1 (L) 07/12/2014 0902   CL 107 11/22/2015 1113   CL 104 07/12/2014 0902   CO2 23 11/22/2015 1113   CO2 31 07/12/2014 0902   BUN 19 11/22/2015 1113   BUN 13 07/12/2014 0902   CREATININE 0.89 11/22/2015 1113   CREATININE 0.49 07/12/2014 0902      Component Value Date/Time   CALCIUM 9.2 11/22/2015 1113   CALCIUM 9.1 07/12/2014 0902   ALKPHOS 39 11/22/2015 1113   ALKPHOS 73 07/12/2014 0902   AST 20 11/22/2015 1113   AST 15 07/12/2014 0902   ALT 17 11/22/2015 1113   ALT 11 (L) 07/12/2014 0902   BILITOT 0.9 11/22/2015 1113   BILITOT 0.5 07/12/2014 0902       RADIOGRAPHIC STUDIES: I have personally reviewed the radiological images as listed and agreed with the findings in the report. No results found.   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 Clinically no evidence of progression noted- patient tolerating Sutent well- except AEs  as discussed below.  PET July 27th- significnat response.   # recommend sutent 25 mg 2 weeks ON; 1 week OFF; start in 1 week.   # Mucositis-recommend duke magic mouth wash; use salt-baking soda rinses.   # diarrhea- 2-3 weeks;sec to sutent;  Imodium prn.   # elevated BP- sec to sutent/ recommend stop sudafed.   # follow up in 4 weeks/ labs.   No orders of the defined types were placed in this encounter.     Cammie Sickle, MD 11/22/2015 1:01 PM

## 2015-11-22 NOTE — Progress Notes (Signed)
Patient missed 1 dose of Sutent on 11/08/15. She takes the Sutent daily 3 weeks on and 1 week on.  Her last dose was taken this past Monday on 11/11/15.  She has received her new sutent RX in the mail. She restarted back on the Sutent today.   She reports frequent intermittent episodes of diarrhea.  She has not taken any antidiarrhea medication interventions for diarrhea.  Dr. Rogue Bussing advised her take OTC-Imodium A D for diarrhea episodes.  Pt states that she is trying to take OTC sudafed to "dry up my bowels."

## 2015-11-27 ENCOUNTER — Telehealth: Payer: Self-pay | Admitting: *Deleted

## 2015-11-27 NOTE — Telephone Encounter (Signed)
Per Dr Rogue Bussing, patient is to start her cycle on Friday as planned. I called and left VM at pt request to not take any more until Froiday

## 2015-11-27 NOTE — Telephone Encounter (Signed)
She called and stated that she looked at her prescription wrong and started her Chemo last night, she was to start it Friday, now she is inquiring if should she go ahead with her cycle or restart on Friday as originally planned. Please advise

## 2015-12-04 ENCOUNTER — Other Ambulatory Visit: Payer: Self-pay | Admitting: *Deleted

## 2015-12-04 DIAGNOSIS — C49A0 Gastrointestinal stromal tumor, unspecified site: Secondary | ICD-10-CM

## 2015-12-04 MED ORDER — SUNITINIB MALATE 25 MG PO CAPS: ORAL_CAPSULE | ORAL | 6 refills | Status: DC

## 2015-12-18 ENCOUNTER — Other Ambulatory Visit: Payer: Self-pay

## 2015-12-18 ENCOUNTER — Telehealth: Payer: Self-pay | Admitting: *Deleted

## 2015-12-18 DIAGNOSIS — C269 Malignant neoplasm of ill-defined sites within the digestive system: Secondary | ICD-10-CM

## 2015-12-18 NOTE — Telephone Encounter (Signed)
Asking if she can get the flu shot, she is on her chemo pill break right now. Please advise

## 2015-12-18 NOTE — Telephone Encounter (Signed)
Patient advised per VO Dr B ok to get flu shot

## 2015-12-23 ENCOUNTER — Inpatient Hospital Stay: Payer: Medicare PPO | Attending: Internal Medicine

## 2015-12-23 ENCOUNTER — Inpatient Hospital Stay (HOSPITAL_BASED_OUTPATIENT_CLINIC_OR_DEPARTMENT_OTHER): Payer: Medicare PPO | Admitting: Internal Medicine

## 2015-12-23 VITALS — BP 136/73 | HR 98 | Temp 97.0°F | Resp 16 | Ht 59.0 in | Wt 133.0 lb

## 2015-12-23 DIAGNOSIS — Z803 Family history of malignant neoplasm of breast: Secondary | ICD-10-CM

## 2015-12-23 DIAGNOSIS — M1990 Unspecified osteoarthritis, unspecified site: Secondary | ICD-10-CM

## 2015-12-23 DIAGNOSIS — Z79899 Other long term (current) drug therapy: Secondary | ICD-10-CM | POA: Insufficient documentation

## 2015-12-23 DIAGNOSIS — K1230 Oral mucositis (ulcerative), unspecified: Secondary | ICD-10-CM

## 2015-12-23 DIAGNOSIS — C786 Secondary malignant neoplasm of retroperitoneum and peritoneum: Secondary | ICD-10-CM | POA: Insufficient documentation

## 2015-12-23 DIAGNOSIS — C49A4 Gastrointestinal stromal tumor of large intestine: Secondary | ICD-10-CM | POA: Diagnosis not present

## 2015-12-23 DIAGNOSIS — Z880 Allergy status to penicillin: Secondary | ICD-10-CM

## 2015-12-23 DIAGNOSIS — R197 Diarrhea, unspecified: Secondary | ICD-10-CM

## 2015-12-23 DIAGNOSIS — I10 Essential (primary) hypertension: Secondary | ICD-10-CM

## 2015-12-23 DIAGNOSIS — C269 Malignant neoplasm of ill-defined sites within the digestive system: Secondary | ICD-10-CM

## 2015-12-23 DIAGNOSIS — K219 Gastro-esophageal reflux disease without esophagitis: Secondary | ICD-10-CM

## 2015-12-23 LAB — CBC WITH DIFFERENTIAL/PLATELET
BASOS PCT: 0 %
Basophils Absolute: 0 10*3/uL (ref 0–0.1)
Eosinophils Absolute: 0.1 10*3/uL (ref 0–0.7)
Eosinophils Relative: 2 %
HEMATOCRIT: 30.5 % — AB (ref 35.0–47.0)
HEMOGLOBIN: 11 g/dL — AB (ref 12.0–16.0)
LYMPHS ABS: 0.8 10*3/uL — AB (ref 1.0–3.6)
LYMPHS PCT: 25 %
MCH: 39.4 pg — AB (ref 26.0–34.0)
MCHC: 36 g/dL (ref 32.0–36.0)
MCV: 109.5 fL — AB (ref 80.0–100.0)
MONO ABS: 0.3 10*3/uL (ref 0.2–0.9)
MONOS PCT: 9 %
NEUTROS ABS: 1.9 10*3/uL (ref 1.4–6.5)
NEUTROS PCT: 64 %
Platelets: 239 10*3/uL (ref 150–440)
RBC: 2.79 MIL/uL — ABNORMAL LOW (ref 3.80–5.20)
RDW: 13.5 % (ref 11.5–14.5)
WBC: 3 10*3/uL — ABNORMAL LOW (ref 3.6–11.0)

## 2015-12-23 LAB — COMPREHENSIVE METABOLIC PANEL
ALK PHOS: 41 U/L (ref 38–126)
ALT: 14 U/L (ref 14–54)
AST: 17 U/L (ref 15–41)
Albumin: 3.9 g/dL (ref 3.5–5.0)
Anion gap: 6 (ref 5–15)
BILIRUBIN TOTAL: 0.7 mg/dL (ref 0.3–1.2)
BUN: 14 mg/dL (ref 6–20)
CHLORIDE: 107 mmol/L (ref 101–111)
CO2: 26 mmol/L (ref 22–32)
CREATININE: 0.72 mg/dL (ref 0.44–1.00)
Calcium: 9.2 mg/dL (ref 8.9–10.3)
Glucose, Bld: 82 mg/dL (ref 65–99)
POTASSIUM: 3.9 mmol/L (ref 3.5–5.1)
Sodium: 139 mmol/L (ref 135–145)
TOTAL PROTEIN: 6.4 g/dL — AB (ref 6.5–8.1)

## 2015-12-23 NOTE — Progress Notes (Signed)
[facility] OFFICE PROGRESS NOTE  Patient Care Team: Rusty Aus, MD as PCP - General (Internal Medicine)  No matching staging information was found for the patient.   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 Prognostic/predictive value]     [place]   [place] Initial Diagnosis    Malignant [facility] gastrointestinal tract ([facility]        INTERVAL HISTORY:  SDarris CarachureHorner 75y.o.  female pleasant patient above history of gist metastatic- is here for follow-up patient is currently on sunitinib since March 2017; With excellent response on a PET scan in July 2017 is here for follow-up.  Patient stated that she is taking Sutent 2 weeks on and 2 weeks off. This is her off week.  No nausea no vomiting.No marked. No diarrhea. No headaches. Denies any rash palms and soles. No chest pain or shortness of breath or cough. Complains of mild intermittent abdominal pain. Not any worse.   REVIEW OF SYSTEMS:  A complete 10 point review of system is done which is negative except mentioned above/history of present illness.   PAST MEDICAL HISTORY :  Past Medical  History:  Diagnosis Date  . Arthritis   . Cancer ([facility]    GIST  . GERD (gastroesophageal reflux disease)   . G[place] (gastrointestinal stroma tumor), [place]   . History of hiatal hernia   . Hypertension    NO MEDS NOW  . Tremors of nervous system    HEAD    PAST SURGICAL HISTORY :   Past Surgical History:  Procedure Laterality Date  . ABDOMINAL HYSTERECTOMY    . BREAST BIOPSY Right   . CATARACT EXTRACTION W/PHACO Right 01/28/2015   Procedure: CATARACT EXTRACTION PHACO AND INTRAOCULAR LENS PLACEMENT (IOC);  Surgeon: SEstill Cotta MD;  Location: [facility] ORS;  Service: Ophthalmology;  Laterality: Right;  [place]:59.3 AP% 22.6 CDE 24.69 fluid pack lot ##[id number]  . CATARACT EXTRACTION W/PHACO Left 02/11/2015   Procedure: CATARACT EXTRACTION PHACO AND INTRAOCULAR LENS PLACEMENT (IOC);  Surgeon: SEstill Cotta MD;  Location: [facility] ORS;  Service: Ophthalmology;  Laterality: Left;  [place]01:18 AP% 22.8 CDE 32.38  fluid pack lot # [id number]  . COLON SURGERY     COLOSTOMY AND REVERSAL  . STOMACH SURGERY     CANCER    FAMILY HISTORY :   Family History  Problem Relation Age of Onset  . Breast cancer Maternal Grandmother     SOCIAL HISTORY:   Social History  Substance Use Topics  . Smoking status: Never Smoker  . Smokeless tobacco: Not on file  . Alcohol use No    ALLERGIES:  is allergic to other and penicillins.  MEDICATIONS:  Current Outpatient Prescriptions  Medication Sig Dispense Refill  . Cetirizine HCl (ZYRTEC PO) Take by mouth.    . Cholecalciferol (VITAMIN D3 ADULT GUMMIES) 1000 units CHEW Chew 1 each by mouth daily.    . Cyanocobalamin (RA VITAMIN B-12 TR) 1000 MCG TBCR Take by mouth.    Marland Kitchen FLUZONE HIGH-DOSE 0.5 ML SUSY     . Iron-Vitamin C (VITRON-C) 65-125 MG TABS Take by mouth.    . Loperamide HCl (IMODIUM A-D PO) Take 1 tablet by mouth as needed.    . megestrol (MEGACE) 40 MG tablet take 1 tablet three times a week  2  . Multiple Vitamin  (MULTIVITAMIN) tablet Take 1 tablet by mouth daily.    . potassium chloride SA (K-DUR,KLOR-CON) 20 MEQ tablet Take 1 tablet (20 mEq total) by mouth daily. 30 tablet 0  . ranitidine (ZANTAC) 150 MG tablet Take by mouth.    . mometasone (NASONEX) 50 MCG/ACT nasal spray 50 sprays.    . SUNItinib (SUTENT) 25 MG capsule Take 1 capsule by mouth daily for 14 days then 7 days off. (Patient not taking: Reported on 12/23/2015) 14 capsule 6   No current facility-administered medications for this visit.     PHYSICAL EXAMINATION: ECOG PERFORMANCE STATUS: 0 - Asymptomatic  BP 136/73 (BP Location: Left Arm, Patient Position: Sitting)   Pulse 98   Temp 97 F (36.1 C) (Tympanic)   Resp 16   Ht 4' 11"  (1.499 m)   Wt 133 lb (60.3 kg)   BMI 26.86 kg/m   Filed Weights   12/23/15 1103  Weight: 133 lb (60.3 kg)    GENERAL: Thin built moderately nourished; Caucasian female patient Alert, no distress and comfortable. She is alone.  EYES: no pallor or icterus OROPHARYNX: no thrush or ulceration; poor dentition  NECK: supple, no masses felt LYMPH:  no palpable lymphadenopathy in the cervical, axillary or inguinal regions LUNGS: clear to auscultation and  No wheeze or crackles HEART/CVS: regular rate & rhythm and no murmurs; No lower extremity edema ABDOMEN:abdomen soft, non-tender and normal bowel sounds Musculoskeletal:no cyanosis of digits and no clubbing  PSYCH: alert & oriented x 3 with fluent speech NEURO: no focal motor/sensory deficits SKIN:  no rashes or significant lesions  LABORATORY DATA:  I have reviewed the data as listed    Component Value Date/Time   NA 139 12/23/2015 1020   NA 140 07/12/2014 0902   K 3.9 12/23/2015 1020   K 3.1 (L) 07/12/2014 0902   CL 107 12/23/2015 1020   CL 104 07/12/2014 0902   CO2 26 12/23/2015 1020   CO2 31 07/12/2014 0902   GLUCOSE 82 12/23/2015 1020   GLUCOSE 100 (H) 07/12/2014 0902   BUN 14 12/23/2015 1020   BUN 13 07/12/2014 0902   CREATININE  0.72 12/23/2015 1020   CREATININE 0.49 07/12/2014 0902   CALCIUM 9.2 12/23/2015 1020   CALCIUM 9.1 07/12/2014 0902   PROT 6.4 (L) 12/23/2015 1020   PROT 6.8 07/12/2014 0902   ALBUMIN 3.9 12/23/2015 1020   ALBUMIN 3.3 (L) 07/12/2014 0902   AST 17 12/23/2015 1020   AST 15 07/12/2014 0902   ALT 14 12/23/2015 1020   ALT 11 (L) 07/12/2014 0902   ALKPHOS 41 12/23/2015 1020   ALKPHOS 73 07/12/2014 0902   BILITOT 0.7 12/23/2015 1020   BILITOT 0.5 07/12/2014 0902   GFRNONAA >60 12/23/2015 1020   GFRNONAA >60 07/12/2014 0902   GFRAA >60  12/23/2015 1020   GFRAA >60 07/12/2014 0902    No results found for: SPEP, UPEP  Lab Results  Component Value Date   WBC 3.0 (L) 12/23/2015   NEUTROABS 1.9 12/23/2015   HGB 11.0 (L) 12/23/2015   HCT 30.5 (L) 12/23/2015   MCV 109.5 (H) 12/23/2015   PLT 239 12/23/2015      Chemistry      Component Value Date/Time   NA 139 12/23/2015 1020   NA 140 07/12/2014 0902   K 3.9 12/23/2015 1020   K 3.1 (L) 07/12/2014 0902   CL 107 12/23/2015 1020   CL 104 07/12/2014 0902   CO2 26 12/23/2015 1020   CO2 31 07/12/2014 0902   BUN 14 12/23/2015 1020   BUN 13 07/12/2014 0902   CREATININE 0.72 12/23/2015 1020   CREATININE 0.49 07/12/2014 0902      Component Value Date/Time   CALCIUM 9.2 12/23/2015 1020   CALCIUM 9.1 07/12/2014 0902   ALKPHOS 41 12/23/2015 1020   ALKPHOS 73 07/12/2014 0902   AST 17 12/23/2015 1020   AST 15 07/12/2014 0902   ALT 14 12/23/2015 1020   ALT 11 (L) 07/12/2014 0902   BILITOT 0.7 12/23/2015 1020   BILITOT 0.5 07/12/2014 0902       RADIOGRAPHIC STUDIES: I have personally reviewed the radiological images as listed and agreed with the findings in the report. No results found.   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 Clinically no evidence of progression noted- patient tolerating  Sutent well- except AEs as discussed below.  PET July 27th- significnat response.   # Continue Sutent 25 mg 2 weeks ON; 2 week OFF [sec to AEs]; Oct 6th th-STARTING again.   # Mucositis-improved; duke magic mouth wash/ salt-baking soda rinses.   # diarrhea- 2-3 weeks;sec to sutent;  Imodium prn.   # elevated BP- sec to sutent/ improved.   # follow up in 4 weeks/ labs. Will repeat CT scan in end of November.    Orders Placed This Encounter  Procedures  . CBC with Differential    Standing Status:   Future    Standing Expiration Date:   12/22/2016  . Comprehensive metabolic panel    Standing Status:   Future    Standing Expiration Date:   12/22/2016      Cammie Sickle, MD 12/23/2015 12:42 PM

## 2015-12-23 NOTE — Assessment & Plan Note (Addendum)
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 Clinically no evidence of progression noted- patient tolerating Sutent well- except AEs as discussed below.  PET July 27th- significnat response.   # Continue Sutent 25 mg 2 weeks ON; 2 week OFF [sec to AEs]; Oct 6th th-STARTING again.   # Mucositis-improved; duke magic mouth wash/ salt-baking soda rinses.   # diarrhea- 2-3 weeks;sec to sutent;  Imodium prn.   # elevated BP- sec to sutent/ improved.   # follow up in 4 weeks/ labs. Will repeat CT scan in end of November.

## 2015-12-23 NOTE — Progress Notes (Signed)
Patient states that she is scheduled to restart back on her Sutent on 10/6.  Pt states that she reports dark stools.  Patient denies any bleeding. Pt taking iron-vitron C.  Patient will start back on  Sutent for dates: 12/27/2015 to 01/10/2016.

## 2015-12-23 NOTE — Patient Instructions (Signed)
Please start back on your Sutent for dates: 12/27/2015 to 01/10/2016.

## 2015-12-30 ENCOUNTER — Telehealth: Payer: Self-pay | Admitting: *Deleted

## 2015-12-30 NOTE — Telephone Encounter (Signed)
Patient informed and repeated back to me

## 2015-12-30 NOTE — Telephone Encounter (Signed)
Patient instructed at last visit to start her Sutent on 10/6. Last pill should be taken 10/20. She will be then be off for 2 weeks.

## 2015-12-30 NOTE — Telephone Encounter (Signed)
Asking for a return call to clarify directions for her Chemo pills, not sure when to stop, how long to be off of them when she does stop and when to restart them. Please return her call

## 2016-01-20 ENCOUNTER — Inpatient Hospital Stay: Payer: Medicare PPO

## 2016-01-20 ENCOUNTER — Inpatient Hospital Stay (HOSPITAL_BASED_OUTPATIENT_CLINIC_OR_DEPARTMENT_OTHER): Payer: Medicare PPO | Admitting: Internal Medicine

## 2016-01-20 VITALS — BP 152/83 | HR 82 | Temp 96.9°F | Resp 18 | Wt 136.2 lb

## 2016-01-20 DIAGNOSIS — K219 Gastro-esophageal reflux disease without esophagitis: Secondary | ICD-10-CM

## 2016-01-20 DIAGNOSIS — R197 Diarrhea, unspecified: Secondary | ICD-10-CM

## 2016-01-20 DIAGNOSIS — M1990 Unspecified osteoarthritis, unspecified site: Secondary | ICD-10-CM

## 2016-01-20 DIAGNOSIS — Z79899 Other long term (current) drug therapy: Secondary | ICD-10-CM

## 2016-01-20 DIAGNOSIS — I10 Essential (primary) hypertension: Secondary | ICD-10-CM

## 2016-01-20 DIAGNOSIS — C786 Secondary malignant neoplasm of retroperitoneum and peritoneum: Secondary | ICD-10-CM | POA: Diagnosis not present

## 2016-01-20 DIAGNOSIS — C269 Malignant neoplasm of ill-defined sites within the digestive system: Secondary | ICD-10-CM

## 2016-01-20 DIAGNOSIS — C49A4 Gastrointestinal stromal tumor of large intestine: Secondary | ICD-10-CM

## 2016-01-20 DIAGNOSIS — Z803 Family history of malignant neoplasm of breast: Secondary | ICD-10-CM

## 2016-01-20 DIAGNOSIS — K1230 Oral mucositis (ulcerative), unspecified: Secondary | ICD-10-CM | POA: Diagnosis not present

## 2016-01-20 DIAGNOSIS — Z880 Allergy status to penicillin: Secondary | ICD-10-CM

## 2016-01-20 LAB — COMPREHENSIVE METABOLIC PANEL
ALBUMIN: 3.9 g/dL (ref 3.5–5.0)
ALK PHOS: 41 U/L (ref 38–126)
ALT: 11 U/L — ABNORMAL LOW (ref 14–54)
ANION GAP: 11 (ref 5–15)
AST: 17 U/L (ref 15–41)
BILIRUBIN TOTAL: 0.7 mg/dL (ref 0.3–1.2)
BUN: 19 mg/dL (ref 6–20)
CALCIUM: 8.9 mg/dL (ref 8.9–10.3)
CO2: 24 mmol/L (ref 22–32)
Chloride: 104 mmol/L (ref 101–111)
Creatinine, Ser: 0.73 mg/dL (ref 0.44–1.00)
GFR calc Af Amer: >60 mL/min (ref >60)
GLUCOSE: 97 mg/dL (ref 65–99)
Potassium: 4 mmol/L (ref 3.5–5.1)
Sodium: 139 mmol/L (ref 135–145)
TOTAL PROTEIN: 6.2 g/dL — AB (ref 6.5–8.1)

## 2016-01-20 LAB — CBC WITH DIFFERENTIAL/PLATELET
BASOS PCT: 0 %
Basophils Absolute: 0 10*3/uL (ref 0–0.1)
EOS PCT: 3 %
Eosinophils Absolute: 0.1 10*3/uL (ref 0–0.7)
HEMATOCRIT: 32.5 % — AB (ref 35.0–47.0)
HEMOGLOBIN: 11.5 g/dL — AB (ref 12.0–16.0)
Lymphocytes Relative: 24 %
Lymphs Abs: 0.8 10*3/uL — ABNORMAL LOW (ref 1.0–3.6)
MCH: 38.9 pg — AB (ref 26.0–34.0)
MCHC: 35.3 g/dL (ref 32.0–36.0)
MCV: 110.2 fL — AB (ref 80.0–100.0)
Monocytes Absolute: 0.3 10*3/uL (ref 0.2–0.9)
Monocytes Relative: 8 %
Neutro Abs: 2.1 10*3/uL (ref 1.4–6.5)
Neutrophils Relative %: 65 %
Platelets: 214 10*3/uL (ref 150–440)
RBC: 2.95 MIL/uL — ABNORMAL LOW (ref 3.80–5.20)
RDW: 13.2 % (ref 11.5–14.5)
WBC: 3.2 10*3/uL — AB (ref 3.6–11.0)

## 2016-01-20 MED ORDER — LISINOPRIL 10 MG PO TABS: 10.0000 mg | ORAL_TABLET | Freq: Every day | ORAL | 3 refills | Status: DC

## 2016-01-20 NOTE — Progress Notes (Signed)
Patient is here for follow up. No complaints  

## 2016-01-20 NOTE — Progress Notes (Signed)
[place] OFFICE PROGRESS NOTE  Patient Care Team: Rusty Aus, MD as PCP - General (Internal Medicine)  No matching staging information was found for the patient.   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 Prognostic/predictive value]     [place]   04/04/2014 Initial Diagnosis    Malignant neoplasm of gastrointestinal tract ([facility]        INTERVAL HISTORY:  SThora SchermanHorner 75y.o.  female pleasant patient above history of gist metastatic- is here for follow-up patient is currently on sunitinib since March 2017; With excellent response on a PET scan in July 2017 is here for follow-up.  Patient denies any pain. Denies any new lumps or bumps. She has been off Sutent for approximately 9 days now.  No nausea no vomiting.No marked. No diarrhea. No headaches. Denies any rash palms and soles. No chest pain or shortness of breath or cough.  REVIEW OF SYSTEMS:  A complete 10 point review of system is done which is negative except mentioned above/history of present illness.   PAST MEDICAL HISTORY :  Past Medical History:  Diagnosis Date  . Arthritis   .  Cancer ([facility]    GIST  . GERD (gastroesophageal reflux disease)   . GIST (gastrointestinal stroma tumor), malignant, colon ([place]   . History of hiatal hernia   . Hypertension    NO MEDS NOW  . Tremors of nervous system    HEAD    PAST SURGICAL HISTORY :   Past Surgical History:  Procedure Laterality Date  . ABDOMINAL HYSTERECTOMY    . BREAST BIOPSY Right   . CATARACT EXTRACTION W/PHACO Right 01/28/2015   Procedure: CATARACT EXTRACTION PHACO AND INTRAOCULAR LENS PLACEMENT (IOC);  Surgeon: SEstill Cotta MD;  Location: [facility] ORS;  Service: Ophthalmology;  Laterality: Right;  [place]:59.3 AP% 22.6 CDE 24.69 fluid pack lot [id number]  . CATARACT EXTRACTION W/PHACO Left 02/11/2015   Procedure: CATARACT EXTRACTION PHACO AND INTRAOCULAR LENS PLACEMENT (IOC);  Surgeon: SEstill Cotta MD;  Location: [facility] ORS;  Service: Ophthalmology;  Laterality: Left;  [place]:18 AP% 22.8 CDE 32.38  fluid pack lot # [id number]  . COLON SURGERY     COLOSTOMY AND REVERSAL  . STOMACH SURGERY     CANCER    FAMILY HISTORY :   Family History  Problem Relation Age of Onset  . Breast cancer Maternal Grandmother     SOCIAL HISTORY:   Social History  Substance Use Topics  . Smoking status: Never Smoker  . Smokeless tobacco: Not on file  . Alcohol use No    ALLERGIES:  is allergic to other and penicillins.  MEDICATIONS:  Current Outpatient Prescriptions  Medication Sig Dispense Refill  . Cetirizine HCl (ZYRTEC PO) Take by mouth.    . Cholecalciferol (VITAMIN D3 ADULT GUMMIES) 1000 units CHEW Chew 1 each by mouth daily.    . Cyanocobalamin (RA VITAMIN B-12 TR) 1000 MCG TBCR Take by mouth.    . Iron-Vitamin C (VITRON-C) 65-125 MG TABS Take by mouth.    . Loperamide HCl (IMODIUM A-D PO) Take 1 tablet by mouth as needed.    . megestrol (MEGACE) 40 MG tablet take 1 tablet three times a week  2  . mometasone (NASONEX) 50 MCG/ACT nasal spray 50 sprays.    . Multiple Vitamin (MULTIVITAMIN) tablet Take 1  tablet by mouth daily.    . multivitamin-lutein (OCUVITE-LUTEIN) CAPS capsule Take 1 capsule by mouth daily.    . potassium chloride SA (K-DUR,KLOR-CON) 20 MEQ tablet Take 1 tablet (20 mEq total) by mouth daily. 30 tablet 0  . ranitidine (ZANTAC) 150 MG tablet Take by mouth.    . SUNItinib (SUTENT) 25 MG capsule Take 1 capsule by mouth daily for 14 days then 7 days off. 14 capsule 6  . lisinopril (PRINIVIL) 10 MG tablet Take 1 tablet (10 mg total) by mouth daily. 30 tablet 3   No current facility-administered medications for this visit.     PHYSICAL EXAMINATION: ECOG PERFORMANCE STATUS: 0 - Asymptomatic  BP (!) 152/83 (BP Location: Left Arm, Patient Position: Sitting)   Pulse 82   Temp (!) 96.9 F (36.1 C) (Tympanic)   Resp 18   Wt 136 lb 3.2 oz (61.8 kg)   SpO2 98%   BMI 27.51 kg/m   Filed Weights   01/20/16 1132  Weight: 136 lb 3.2 oz (61.8 kg)    GENERAL: Thin built moderately nourished; Caucasian female patient Alert, no distress and comfortable. She is alone.  EYES: no pallor or icterus OROPHARYNX: no thrush or ulceration; poor dentition  NECK: supple, no masses felt LYMPH:  no palpable lymphadenopathy in the cervical, axillary or inguinal regions LUNGS: clear to auscultation and  No wheeze or crackles HEART/CVS: regular rate & rhythm and no murmurs; No lower extremity edema ABDOMEN:abdomen soft, non-tender and normal bowel sounds Musculoskeletal:no cyanosis of digits and no clubbing  PSYCH: alert & oriented x 3 with fluent speech NEURO: no focal motor/sensory deficits SKIN:  no rashes or significant lesions  LABORATORY DATA:  I have reviewed the data as listed    Component Value Date/Time   NA 139 01/20/2016 1035   NA 140 07/12/2014 0902   K 4.0 01/20/2016 1035   K 3.1 (L) 07/12/2014 0902   CL 104 01/20/2016 1035   CL 104 07/12/2014 0902   CO2 24 01/20/2016 1035   CO2 31 07/12/2014 0902   GLUCOSE 97 01/20/2016 1035   GLUCOSE 100 (H) 07/12/2014 0902   BUN  19 01/20/2016 1035   BUN 13 07/12/2014 0902   CREATININE 0.73 01/20/2016 1035   CREATININE 0.49 07/12/2014 0902   CALCIUM 8.9 01/20/2016 1035   CALCIUM 9.1 07/12/2014 0902   PROT 6.2 (L) 01/20/2016 1035   PROT 6.8 07/12/2014 0902   ALBUMIN 3.9 01/20/2016 1035   ALBUMIN 3.3 (L) 07/12/2014 0902   AST 17 01/20/2016 1035   AST 15 07/12/2014 0902   ALT 11 (L) 01/20/2016 1035   ALT 11 (L) 07/12/2014 0902   ALKPHOS 41 01/20/2016 1035   ALKPHOS 73 07/12/2014 0902   BILITOT 0.7 01/20/2016 1035   BILITOT 0.5 07/12/2014 0902   GFRNONAA >60  01/20/2016 1035   GFRNONAA >60 07/12/2014 0902   GFRAA >60 01/20/2016 1035   GFRAA >60 07/12/2014 0902    No results found for: SPEP, UPEP  Lab Results  Component Value Date   WBC 3.2 (L) 01/20/2016   NEUTROABS 2.1 01/20/2016   HGB 11.5 (L) 01/20/2016   HCT 32.5 (L) 01/20/2016   MCV 110.2 (H) 01/20/2016   PLT 214 01/20/2016      Chemistry      Component Value Date/Time   NA 139 01/20/2016 1035   NA 140 07/12/2014 0902   K 4.0 01/20/2016 1035   K 3.1 (L) 07/12/2014 0902   CL 104 01/20/2016 1035   CL 104 07/12/2014 0902   CO2 24 01/20/2016 1035   CO2 31 07/12/2014 0902   BUN 19 01/20/2016 1035   BUN 13 07/12/2014 0902   CREATININE 0.73 01/20/2016 1035   CREATININE 0.49 07/12/2014 0902      Component Value Date/Time   CALCIUM 8.9 01/20/2016 1035   CALCIUM 9.1 07/12/2014 0902   ALKPHOS 41 01/20/2016 1035   ALKPHOS 73 07/12/2014 0902   AST 17 01/20/2016 1035   AST 15 07/12/2014 0902   ALT 11 (L) 01/20/2016 1035   ALT 11 (L) 07/12/2014 0902   BILITOT 0.7 01/20/2016 1035   BILITOT 0.5 07/12/2014 0902       RADIOGRAPHIC STUDIES: I have personally reviewed the radiological images as listed and agreed with the findings in the report. No results found.   ASSESSMENT & PLAN:  Malignant neoplasm of gastrointestinal tract ([place]) # Metastatic gist tumor small bowel- currently on Sunitinib 25 mg 3 weeks on 1 week off- since March 2017.  Clinically no evidence of progression noted- patient tolerating Sutent well- except AEs as discussed below.  PET July 27th- significnat response.   # Discussed with the patient the stage of malignancy being 4; and all treatments are palliative. Unfortunately no cure. Usually, the life expectancy is in the order of years; and usually not in decades  As the patient has initially thought.  # Continue Sutent 25 mg 2 weeks ON; 1 week OFF [sec to AEs]; START again today.   # Mucositis-improved; [facility] magic mouth wash/ salt-baking soda rinses.   # diarrhea- improved;  sec to sutent;  Imodium prn.   # elevated BP- sec to sutent/ start Lisinopril-73m/day.   # follow up in 5 weeks/ labs.Ct scan in 4 Weeks.    Orders Placed This Encounter  Procedures  . CT ABDOMEN PELVIS W CONTRAST    Standing Status:   Future    Standing Expiration Date:   04/20/2017    Order Specific Question:   Reason for Exam (SYMPTOM  OR DIAGNOSIS REQUIRED)    Answer:   GIST tumor    Order Specific Question:   Preferred imaging location?    Answer:   [facility]  . CBC with Differential/Platelet    Standing Status:   Future    Standing Expiration Date:   01/19/2017  . Comprehensive metabolic panel    Standing Status:   Future    Standing Expiration Date:   01/19/2017      GCammie Sickle MD 01/21/2016 8:19 AM

## 2016-01-20 NOTE — Assessment & Plan Note (Addendum)
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 Clinically no evidence of progression noted- patient tolerating Sutent well- except AEs as discussed below.  PET July 27th- significnat response.   # Discussed with the patient the stage of malignancy being 4; and all treatments are palliative. Unfortunately no cure. Usually, the life expectancy is in the order of years; and usually not in decades  As the patient has initially thought.  # Continue Sutent 25 mg 2 weeks ON; 1 week OFF [sec to AEs]; START again today.   # Mucositis-improved; duke magic mouth wash/ salt-baking soda rinses.   # diarrhea- improved;  sec to sutent;  Imodium prn.   # elevated BP- sec to sutent/ start Lisinopril-10mg /day.   # follow up in 5 weeks/ labs.Ct scan in 4 Weeks.

## 2016-01-27 ENCOUNTER — Telehealth: Payer: Self-pay | Admitting: *Deleted

## 2016-01-27 NOTE — Telephone Encounter (Signed)
Pt requests call back when she needs to come in to sign her paperwork.

## 2016-01-28 NOTE — Telephone Encounter (Signed)
Per [place], CMA (AAMA). [place] personally spoke to patient regarding her documentation needed for financial assistance and paperwork. Pt stated that she would come to [place] on Wednesday to bring this document and to sign her portion of the paperwork.

## 2016-01-29 ENCOUNTER — Encounter: Payer: Self-pay | Admitting: *Deleted

## 2016-01-29 NOTE — Progress Notes (Signed)
Patient came to clinic today to sign her [place] re-enrollment application for her sutent.  This form was faxed to [employer].

## 2016-02-04 ENCOUNTER — Other Ambulatory Visit: Payer: Self-pay | Admitting: *Deleted

## 2016-02-04 ENCOUNTER — Telehealth: Payer: Self-pay | Admitting: *Deleted

## 2016-02-04 DIAGNOSIS — C49A0 Gastrointestinal stromal tumor, unspecified site: Secondary | ICD-10-CM

## 2016-02-04 MED ORDER — SUNITINIB MALATE 25 MG PO CAPS: ORAL_CAPSULE | ORAL | 6 refills | Status: DC

## 2016-02-04 NOTE — Progress Notes (Signed)
New script faxed to [employer]

## 2016-02-04 NOTE — Telephone Encounter (Signed)
Needs her medicine, only has 3 tabs left, please check into this

## 2016-02-06 ENCOUNTER — Telehealth: Payer: Self-pay | Admitting: *Deleted

## 2016-02-06 NOTE — Telephone Encounter (Signed)
[employer] confirmed sutent rx rcvd dated 02/04/16 and has been processed. Has attempted to reach patient w/o success to set up delivery.  Pt has not yet returned their call.

## 2016-02-10 ENCOUNTER — Telehealth: Payer: Self-pay | Admitting: *Deleted

## 2016-02-10 NOTE — Telephone Encounter (Signed)
Per Dr Rogue Bussing, hold med for now, use baking soda and water mouth rinses. Or magic mouth wash. She reports she cannot use magic mouthwash, it made her tongue swell last time she used it., but she will use the baking soda and watch her diet and eat bland foods for now

## 2016-02-10 NOTE — Telephone Encounter (Signed)
Took 17 days of medicine before she looked at the directions written down for her and now her tongue is burning and sore and her throat is sore and feels swollen. Asking if she is to restart her medicine tonight as was planned. Pease advise

## 2016-02-17 ENCOUNTER — Ambulatory Visit
Admission: RE | Admit: 2016-02-17 | Discharge: 2016-02-17 | Disposition: A | Discharge status: Home / Self Care | Payer: Medicare PPO | Source: Ambulatory Visit | Attending: Internal Medicine | Admitting: Internal Medicine

## 2016-02-17 DIAGNOSIS — C269 Malignant neoplasm of ill-defined sites within the digestive system: Secondary | ICD-10-CM | POA: Insufficient documentation

## 2016-02-17 MED ORDER — IOPAMIDOL (ISOVUE-300) INJECTION 61%
85.0000 mL | Freq: Once | INTRAVENOUS | Status: AC | PRN
  Administered 2016-02-17: 85 mL via INTRAVENOUS

## 2016-02-18 ENCOUNTER — Telehealth: Payer: Self-pay | Admitting: *Deleted

## 2016-02-18 NOTE — Telephone Encounter (Signed)
Per Dr Rogue Bussing, go ahead and restart med and he will see her on 12/4 as planned. Patient informed and repeated back to me

## 2016-02-18 NOTE — Telephone Encounter (Signed)
Asking when she is to restart pills, her tongue is better but her throat is still red. ASking if she should do 1 week on 1 week off. Please advise

## 2016-02-24 ENCOUNTER — Telehealth: Payer: Self-pay | Admitting: *Deleted

## 2016-02-24 ENCOUNTER — Inpatient Hospital Stay (HOSPITAL_BASED_OUTPATIENT_CLINIC_OR_DEPARTMENT_OTHER): Payer: Medicare PPO | Admitting: Internal Medicine

## 2016-02-24 ENCOUNTER — Other Ambulatory Visit: Payer: Self-pay

## 2016-02-24 ENCOUNTER — Inpatient Hospital Stay: Payer: Medicare PPO | Attending: Internal Medicine

## 2016-02-24 VITALS — BP 153/81 | HR 81 | Temp 97.0°F | Ht 59.0 in | Wt 133.4 lb

## 2016-02-24 DIAGNOSIS — K219 Gastro-esophageal reflux disease without esophagitis: Secondary | ICD-10-CM | POA: Insufficient documentation

## 2016-02-24 DIAGNOSIS — N329 Bladder disorder, unspecified: Secondary | ICD-10-CM | POA: Insufficient documentation

## 2016-02-24 DIAGNOSIS — Z803 Family history of malignant neoplasm of breast: Secondary | ICD-10-CM

## 2016-02-24 DIAGNOSIS — K1230 Oral mucositis (ulcerative), unspecified: Secondary | ICD-10-CM

## 2016-02-24 DIAGNOSIS — R197 Diarrhea, unspecified: Secondary | ICD-10-CM | POA: Insufficient documentation

## 2016-02-24 DIAGNOSIS — C269 Malignant neoplasm of ill-defined sites within the digestive system: Secondary | ICD-10-CM

## 2016-02-24 DIAGNOSIS — I10 Essential (primary) hypertension: Secondary | ICD-10-CM | POA: Diagnosis not present

## 2016-02-24 DIAGNOSIS — R590 Localized enlarged lymph nodes: Secondary | ICD-10-CM

## 2016-02-24 DIAGNOSIS — C49A4 Gastrointestinal stromal tumor of large intestine: Secondary | ICD-10-CM | POA: Diagnosis not present

## 2016-02-24 DIAGNOSIS — Z79899 Other long term (current) drug therapy: Secondary | ICD-10-CM

## 2016-02-24 DIAGNOSIS — Z880 Allergy status to penicillin: Secondary | ICD-10-CM | POA: Insufficient documentation

## 2016-02-24 DIAGNOSIS — C786 Secondary malignant neoplasm of retroperitoneum and peritoneum: Secondary | ICD-10-CM

## 2016-02-24 LAB — CBC WITH DIFFERENTIAL/PLATELET
BASOS ABS: 0 10*3/uL (ref 0–0.1)
Basophils Relative: 1 %
Eosinophils Absolute: 0.1 10*3/uL (ref 0–0.7)
Eosinophils Relative: 2 %
HEMATOCRIT: 35.7 % (ref 35.0–47.0)
HEMOGLOBIN: 12.7 g/dL (ref 12.0–16.0)
LYMPHS PCT: 29 %
Lymphs Abs: 0.9 10*3/uL — ABNORMAL LOW (ref 1.0–3.6)
MCH: 37.7 pg — ABNORMAL HIGH (ref 26.0–34.0)
MCHC: 35.5 g/dL (ref 32.0–36.0)
MCV: 106.2 fL — AB (ref 80.0–100.0)
MONO ABS: 0.3 10*3/uL (ref 0.2–0.9)
Monocytes Relative: 9 %
NEUTROS ABS: 1.9 10*3/uL (ref 1.4–6.5)
NEUTROS PCT: 59 %
Platelets: 283 10*3/uL (ref 150–440)
RBC: 3.36 MIL/uL — AB (ref 3.80–5.20)
RDW: 12.1 % (ref 11.5–14.5)
WBC: 3.2 10*3/uL — AB (ref 3.6–11.0)

## 2016-02-24 LAB — COMPREHENSIVE METABOLIC PANEL
ALK PHOS: 42 U/L (ref 38–126)
ALT: 15 U/L (ref 14–54)
AST: 22 U/L (ref 15–41)
Albumin: 4.2 g/dL (ref 3.5–5.0)
Anion gap: 9 (ref 5–15)
BILIRUBIN TOTAL: 0.6 mg/dL (ref 0.3–1.2)
BUN: 16 mg/dL (ref 6–20)
CO2: 24 mmol/L (ref 22–32)
CREATININE: 0.87 mg/dL (ref 0.44–1.00)
Calcium: 9.3 mg/dL (ref 8.9–10.3)
Chloride: 105 mmol/L (ref 101–111)
GFR calc Af Amer: >60 mL/min (ref >60)
GLUCOSE: 112 mg/dL — AB (ref 65–99)
Potassium: 4 mmol/L (ref 3.5–5.1)
Sodium: 138 mmol/L (ref 135–145)
TOTAL PROTEIN: 6.7 g/dL (ref 6.5–8.1)

## 2016-02-24 NOTE — Progress Notes (Signed)
[place] OFFICE PROGRESS NOTE  Patient Care Team: Rusty Aus, MD as PCP - General (Internal Medicine)  No matching staging information was found for the patient.   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 Biopsy of the mass showed a CD117 positive spindle cell tumor consistent with a GIST; 02/21/14 showed primary mass in mesentery with a peritoneal nodule in right posterior pelvis and nodule in pelvic cul de sac] ; Surgery April-May 2017 @[facility]   # GIST-June 2016- on Gleevec 312m alternating with 400  # FEB 2017- CT marked Progression of RP/pelvic sidewall LN; new paraesophageal LN; March 2017- Started Sutent 25 mg 3W-On 7 one week OFF [sec to SEs]  # July 27th PET- Significant PR; Sutent 2 w-On & 1 w OFF;  CT 11/27-STABLE DISEASE.   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 Prognostic/predictive value]     [place]   04/04/2014 Initial Diagnosis    [place]        INTERVAL HISTORY:  Courtney [name] 75y.o.  female pleasant patient above history of gist metastatic- is here for follow-up patient is currently on sunitinib since March 2017;is here for a follow up/review the results of CT scan.  Complains of mild soreness in the mouth; interferes with her eating. However she has been using salt water rinses although inconsistently. Patient has been on Sutent for about 5 days now.  Patient denies any pain. Denies any new lumps or bumps.  No nausea no vomiting.No marked. No diarrhea. No headaches. Denies any rash palms and soles. No chest pain or shortness of breath or cough.  REVIEW OF SYSTEMS:  A complete 10 point review of system  is done which is negative except mentioned above/history of present illness.   PAST MEDICAL HISTORY :  Past Medical History:  Diagnosis Date  . Arthritis   . Cancer ([facility]    GIST  . GERD (gastroesophageal reflux disease)   . G[place] (gastrointestinal stroma tumor), [place]   . History of hiatal hernia   . Hypertension    NO MEDS NOW  . Tremors of nervous system    HEAD    PAST SURGICAL HISTORY :   Past Surgical History:  Procedure Laterality Date  . ABDOMINAL HYSTERECTOMY    . BREAST BIOPSY Right   . CATARACT EXTRACTION W/PHACO Right 01/28/2015   Procedure: CATARACT EXTRACTION PHACO AND INTRAOCULAR LENS PLACEMENT (IOC);  Surgeon: SEstill Cotta MD;  Location: [facility] ORS;  Service: Ophthalmology;  Laterality: Right;  [place]:59.3 AP% 22.6 CDE 24.69 fluid pack lot ##[id number]  . CATARACT EXTRACTION W/PHACO Left 02/11/2015   Procedure: CATARACT EXTRACTION PHACO AND INTRAOCULAR LENS PLACEMENT (IOC);  Surgeon: SEstill Cotta MD;  Location: [facility] ORS;  Service: Ophthalmology;  Laterality: Left;  [place]:18 AP% 22.8 CDE 32.38  fluid pack lot # [id number]  . COLON SURGERY     COLOSTOMY AND REVERSAL  . STOMACH SURGERY     CANCER    FAMILY HISTORY :   Family History  Problem Relation Age of Onset  . Breast cancer Maternal Grandmother     SOCIAL HISTORY:   Social History  Substance Use Topics  .  Smoking status: Never Smoker  . Smokeless tobacco: Not on file  . Alcohol use No    ALLERGIES:  is allergic to other and penicillins.  MEDICATIONS:  Current Outpatient Prescriptions  Medication Sig Dispense Refill  . Cetirizine HCl (ZYRTEC PO) Take by mouth.    . Cholecalciferol (VITAMIN D3 ADULT GUMMIES) 1000 units CHEW Chew 1 each by mouth daily.    . Cyanocobalamin (RA VITAMIN B-12 TR) 1000 MCG TBCR Take by mouth.    . Iron-Vitamin C (VITRON-C) 65-125 MG TABS Take by mouth.    Marland Kitchen lisinopril (PRINIVIL) 10 MG tablet Take 1 tablet (10 mg total) by mouth daily. 30  tablet 3  . Loperamide HCl (IMODIUM A-D PO) Take 1 tablet by mouth as needed.    . megestrol (MEGACE) 40 MG tablet take 1 tablet three times a week  2  . mometasone (NASONEX) 50 MCG/ACT nasal spray 50 sprays.    . Multiple Vitamin (MULTIVITAMIN) tablet Take 1 tablet by mouth daily.    . multivitamin-lutein (OCUVITE-LUTEIN) CAPS capsule Take 1 capsule by mouth daily.    . potassium chloride SA (K-DUR,KLOR-CON) 20 MEQ tablet Take 1 tablet (20 mEq total) by mouth daily. 30 tablet 0  . ranitidine (ZANTAC) 150 MG tablet Take by mouth.    . SUNItinib (SUTENT) 25 MG capsule Take 1 capsule by mouth daily for 14 days then 7 days off. 14 capsule 6   No current facility-administered medications for this visit.     PHYSICAL EXAMINATION: ECOG PERFORMANCE STATUS: 0 - Asymptomatic  BP (!) 153/81 (BP Location: Left Arm, Patient Position: Sitting) Comment: patient states she may have forgotten to take her blood pressure medication  Pulse 81   Temp 97 F (36.1 C) (Tympanic)   Ht 4' 11"  (1.499 m)   Wt 133 lb 6.4 oz (60.5 kg)   BMI 26.94 kg/m   Filed Weights   02/24/16 0901  Weight: 133 lb 6.4 oz (60.5 kg)    GENERAL: Thin built moderately nourished; Caucasian female patient Alert, no distress and comfortable. She is alone.  EYES: no pallor or icterus OROPHARYNX: no thrush or ulceration; poor dentition  NECK: supple, no masses felt LYMPH:  no palpable lymphadenopathy in the cervical, axillary or inguinal regions LUNGS: clear to auscultation and  No wheeze or crackles HEART/CVS: regular rate & rhythm and no murmurs; No lower extremity edema ABDOMEN:abdomen soft, non-tender and normal bowel sounds Musculoskeletal:no cyanosis of digits and no clubbing  PSYCH: alert & oriented x 3 with fluent speech NEURO: no focal motor/sensory deficits SKIN:  no rashes or significant lesions  LABORATORY DATA:  I have reviewed the data as listed    Component Value Date/Time   NA 138 02/24/2016 0845   NA 140  07/12/2014 0902   K 4.0 02/24/2016 0845   K 3.1 (L) 07/12/2014 0902   CL 105 02/24/2016 0845   CL 104 07/12/2014 0902   CO2 24 02/24/2016 0845   CO2 31 07/12/2014 0902   GLUCOSE 112 (H) 02/24/2016 0845   GLUCOSE 100 (H) 07/12/2014 0902   BUN 16 02/24/2016 0845   BUN 13 07/12/2014 0902   CREATININE 0.87 02/24/2016 0845   CREATININE 0.49 07/12/2014 0902   CALCIUM 9.3 02/24/2016 0845   CALCIUM 9.1 07/12/2014 0902   PROT 6.7 02/24/2016 0845   PROT 6.8 07/12/2014 0902   ALBUMIN 4.2 02/24/2016 0845   ALBUMIN 3.3 (L) 07/12/2014 0902   AST 22 02/24/2016 0845   AST 15 07/12/2014 0902  ALT 15 02/24/2016 0845   ALT 11 (L) 07/12/2014 0902   ALKPHOS 42 02/24/2016 0845   ALKPHOS 73 07/12/2014 0902   BILITOT 0.6 02/24/2016 0845   BILITOT 0.5 07/12/2014 0902   GFRNONAA >60 02/24/2016 0845   GFRNONAA >60 07/12/2014 0902   GFRAA >60 02/24/2016 0845   GFRAA >60 07/12/2014 0902    No results found for: SPEP, UPEP  Lab Results  Component Value Date   WBC 3.2 (L) 02/24/2016   NEUTROABS 1.9 02/24/2016   HGB 12.7 02/24/2016   HCT 35.7 02/24/2016   MCV 106.2 (H) 02/24/2016   PLT 283 02/24/2016      Chemistry      Component Value Date/Time   NA 138 02/24/2016 0845   NA 140 07/12/2014 0902   K 4.0 02/24/2016 0845   K 3.1 (L) 07/12/2014 0902   CL 105 02/24/2016 0845   CL 104 07/12/2014 0902   CO2 24 02/24/2016 0845   CO2 31 07/12/2014 0902   BUN 16 02/24/2016 0845   BUN 13 07/12/2014 0902   CREATININE 0.87 02/24/2016 0845   CREATININE 0.49 07/12/2014 0902      Component Value Date/Time   CALCIUM 9.3 02/24/2016 0845   CALCIUM 9.1 07/12/2014 0902   ALKPHOS 42 02/24/2016 0845   ALKPHOS 73 07/12/2014 0902   AST 22 02/24/2016 0845   AST 15 07/12/2014 0902   ALT 15 02/24/2016 0845   ALT 11 (L) 07/12/2014 0902   BILITOT 0.6 02/24/2016 0845   BILITOT 0.5 07/12/2014 0902     IMPRESSION: 1. No evidence disease progression in the abdomen pelvis. 2. Slight interval decrease in  periaortic adenopathy. 3. Stable to slight enlargement of cystic and solid lesions along the LEFT external iliac nodal station. Portions of these lesions are hypermetabolic comparison PET-CT scan. 4. No change in size of lesion adjacent to the vaginal cuff and rectum which is not hypermetabolic on comparison PET-CT scan. 5. Thickening along the posterior wall the bladder is indeterminate. Recommend clinical correlation for hematuria or cystitis and consider cystoscopy.   Electronically Signed   By: Suzy Bouchard M.D.   On: 02/17/2016 12:14  RADIOGRAPHIC STUDIES: I have personally reviewed the radiological images as listed and agreed with the findings in the report. No results found.   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 Clinically no evidence of progression noted- patient tolerating Sutent well- except AEs as discussed below. CT NOV 27th- STABLE.   # Continue Sutent 25 mg 2 weeks ON; 1 week OFF [sec to AEs]; tolerating well except for mucositis/diarhea.   # Mucositis-G-1. salt-baking soda rinses. [magic mouth wash- tongue swelling*]  # diarrhea- improved;  sec to sutent;  Imodium prn.   # elevated BP-stystolic 381W-  sec to sutent/ Lisinopril-84m/day.   # follow up in 6 weeks/ labs.    I reviewed the images myself and with the patient and family in detail.    Orders Placed This Encounter  Procedures  . CBC with Differential    Standing Status:   Future    Standing Expiration Date:   02/23/2017  . Comprehensive metabolic panel    Standing Status:   Future    Standing Expiration Date:   02/23/2017  . TSH    Standing Status:   Future    Standing Expiration Date:   02/23/2017  Cammie Sickle, MD 02/25/2016 7:58 AM

## 2016-02-24 NOTE — Progress Notes (Signed)
Patient here for follow up. No changes since last visit.   

## 2016-02-24 NOTE — Assessment & Plan Note (Addendum)
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 Clinically no evidence of progression noted- patient tolerating Sutent well- except AEs as discussed below. CT NOV 27th- STABLE.   # Continue Sutent 25 mg 2 weeks ON; 1 week OFF [sec to AEs]; tolerating well except for mucositis/diarhea.   # Mucositis-G-1. salt-baking soda rinses. [magic mouth wash- tongue swelling*]  # diarrhea- improved;  sec to sutent;  Imodium prn.   # elevated BP-stystolic [id number]-  sec to sutent/ Lisinopril-10mg /day.   # follow up in 6 weeks/ labs.    I reviewed the images myself and with the patient and family in detail.

## 2016-02-24 NOTE — Telephone Encounter (Signed)
Called and asked that Dr Rogue Bussing be told he was right, she is to start her med tonight.

## 2016-02-26 ENCOUNTER — Other Ambulatory Visit: Payer: Medicare PPO

## 2016-02-26 ENCOUNTER — Ambulatory Visit: Payer: Medicare PPO | Admitting: Internal Medicine

## 2016-03-02 ENCOUNTER — Encounter: Payer: Self-pay | Admitting: *Deleted

## 2016-03-02 NOTE — Progress Notes (Signed)
Patient came with paperwork - proof of income to give to [employer]. She states that a voice mail was left on her phone to bring this information.  Spoke with patient. In discovery process it was found that this msg was left on pt's phone several weeks ago.  I personally called [employer] to ensure that no further documentation was required. Per [employer], no further documentation was needed. Pt has been approved for 2018 for patient's assistance. [place] states that pt is due for a RF for her Sutent. This was arranged for shipment while on the phone with [employer].  Pt made aware that her shipment will be received in the next few days. She also states make Dr. B aware that she is still using baking soda/warm water to prevent mouth sores.

## 2016-03-24 ENCOUNTER — Other Ambulatory Visit: Payer: Self-pay | Admitting: *Deleted

## 2016-03-24 DIAGNOSIS — C49A0 Gastrointestinal stromal tumor, unspecified site: Secondary | ICD-10-CM

## 2016-03-24 MED ORDER — SUNITINIB MALATE 25 MG PO CAPS: ORAL_CAPSULE | ORAL | 6 refills | Status: DC

## 2016-03-25 ENCOUNTER — Telehealth: Payer: Self-pay | Admitting: *Deleted

## 2016-03-25 DIAGNOSIS — C49A0 Gastrointestinal stromal tumor, unspecified site: Secondary | ICD-10-CM

## 2016-03-25 MED ORDER — SUNITINIB MALATE 25 MG PO CAPS: ORAL_CAPSULE | ORAL | 6 refills | Status: DC

## 2016-03-25 NOTE — Telephone Encounter (Signed)
Escribed

## 2016-04-02 ENCOUNTER — Telehealth: Payer: Self-pay | Admitting: *Deleted

## 2016-04-02 NOTE — Telephone Encounter (Signed)
Patient should contact [employer] for RFs at [phone] to arrange for next sutent shipment.  I left this msg with the above phone number on pt's vm.

## 2016-04-02 NOTE — Telephone Encounter (Signed)
Called to report that she took her last pill last night and she is completely out and has not heard anything from pharmacy regarding a delivery of any more. Please call her.

## 2016-04-06 ENCOUNTER — Inpatient Hospital Stay: Payer: PPO

## 2016-04-06 ENCOUNTER — Inpatient Hospital Stay: Payer: PPO | Attending: Internal Medicine | Admitting: Internal Medicine

## 2016-04-06 DIAGNOSIS — Z803 Family history of malignant neoplasm of breast: Secondary | ICD-10-CM | POA: Insufficient documentation

## 2016-04-06 DIAGNOSIS — M1990 Unspecified osteoarthritis, unspecified site: Secondary | ICD-10-CM | POA: Diagnosis not present

## 2016-04-06 DIAGNOSIS — N329 Bladder disorder, unspecified: Secondary | ICD-10-CM | POA: Diagnosis not present

## 2016-04-06 DIAGNOSIS — C49A4 Gastrointestinal stromal tumor of large intestine: Secondary | ICD-10-CM

## 2016-04-06 DIAGNOSIS — C269 Malignant neoplasm of ill-defined sites within the digestive system: Secondary | ICD-10-CM

## 2016-04-06 DIAGNOSIS — K219 Gastro-esophageal reflux disease without esophagitis: Secondary | ICD-10-CM

## 2016-04-06 DIAGNOSIS — R590 Localized enlarged lymph nodes: Secondary | ICD-10-CM

## 2016-04-06 DIAGNOSIS — K1230 Oral mucositis (ulcerative), unspecified: Secondary | ICD-10-CM | POA: Diagnosis not present

## 2016-04-06 DIAGNOSIS — R197 Diarrhea, unspecified: Secondary | ICD-10-CM | POA: Diagnosis not present

## 2016-04-06 DIAGNOSIS — R600 Localized edema: Secondary | ICD-10-CM | POA: Diagnosis not present

## 2016-04-06 DIAGNOSIS — C786 Secondary malignant neoplasm of retroperitoneum and peritoneum: Secondary | ICD-10-CM | POA: Insufficient documentation

## 2016-04-06 DIAGNOSIS — Z79899 Other long term (current) drug therapy: Secondary | ICD-10-CM | POA: Diagnosis not present

## 2016-04-06 DIAGNOSIS — I10 Essential (primary) hypertension: Secondary | ICD-10-CM | POA: Diagnosis not present

## 2016-04-06 DIAGNOSIS — Z880 Allergy status to penicillin: Secondary | ICD-10-CM

## 2016-04-06 LAB — CBC WITH DIFFERENTIAL/PLATELET
Basophils Absolute: 0 10*3/uL (ref 0–0.1)
Basophils Relative: 1 %
EOS ABS: 0.1 10*3/uL (ref 0–0.7)
Eosinophils Relative: 3 %
HEMATOCRIT: 34.8 % — AB (ref 35.0–47.0)
HEMOGLOBIN: 12.1 g/dL (ref 12.0–16.0)
LYMPHS ABS: 0.8 10*3/uL — AB (ref 1.0–3.6)
LYMPHS PCT: 28 %
MCH: 37.1 pg — AB (ref 26.0–34.0)
MCHC: 34.8 g/dL (ref 32.0–36.0)
MCV: 106.6 fL — AB (ref 80.0–100.0)
Monocytes Absolute: 0.2 10*3/uL (ref 0.2–0.9)
Monocytes Relative: 7 %
NEUTROS ABS: 1.8 10*3/uL (ref 1.4–6.5)
NEUTROS PCT: 61 %
Platelets: 195 10*3/uL (ref 150–440)
RBC: 3.27 MIL/uL — AB (ref 3.80–5.20)
RDW: 12.8 % (ref 11.5–14.5)
WBC: 2.9 10*3/uL — AB (ref 3.6–11.0)

## 2016-04-06 LAB — COMPREHENSIVE METABOLIC PANEL
ALK PHOS: 43 U/L (ref 38–126)
ALT: 18 U/L (ref 14–54)
AST: 20 U/L (ref 15–41)
Albumin: 4 g/dL (ref 3.5–5.0)
Anion gap: 4 — ABNORMAL LOW (ref 5–15)
BILIRUBIN TOTAL: 0.7 mg/dL (ref 0.3–1.2)
BUN: 21 mg/dL — ABNORMAL HIGH (ref 6–20)
CALCIUM: 9.1 mg/dL (ref 8.9–10.3)
CO2: 27 mmol/L (ref 22–32)
CREATININE: 0.81 mg/dL (ref 0.44–1.00)
Chloride: 107 mmol/L (ref 101–111)
GFR calc non Af Amer: >60 mL/min (ref >60)
Glucose, Bld: 86 mg/dL (ref 65–99)
Potassium: 3.8 mmol/L (ref 3.5–5.1)
Sodium: 138 mmol/L (ref 135–145)
Total Protein: 6.3 g/dL — ABNORMAL LOW (ref 6.5–8.1)

## 2016-04-06 LAB — TSH: TSH: 4.419 u[IU]/mL (ref 0.350–4.500)

## 2016-04-06 NOTE — Progress Notes (Signed)
Patient here today for follow up.  Patient has not received sudent in the mail, she states that she has missed the past 4 dosages

## 2016-04-06 NOTE — Progress Notes (Signed)
[place] OFFICE PROGRESS NOTE  Patient Care Team: Rusty Aus, MD as PCP - General (Internal Medicine)  No matching staging information was found for the patient.   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 Biopsy of the mass showed a CD117 positive spindle cell tumor consistent with a GIST; 02/21/14 showed primary mass in mesentery with a peritoneal nodule in right posterior pelvis and nodule in pelvic cul de sac] ; Surgery April-May 2017 @[facility]   # GIST-June 2016- on Gleevec 366m alternating with 400  # FEB 2017- CT marked Progression of RP/pelvic sidewall LN; new paraesophageal LN; March 2017- Started Sutent 25 mg 3W-On 7 one week OFF [sec to SEs]  # July 27th PET- Significant PR; Sutent 2 w-On & 1 w OFF;  CT 11/27-STABLE DISEASE.   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 Prognostic/predictive value]     [place]   04/04/2014 Initial Diagnosis    Malignant neoplasm of gastrointestinal tract ([facility]        INTERVAL HISTORY:  SLashae WollenbergHorner 76y.o.  female pleasant patient above history of gist metastatic- is here for follow-up patient is currently on sunitinib since March 2017.  Complains of mild soreness in the mouth at the end of her two weeks of taking Sutent; interferes with her eating. Uses salt water rinses PRN and they seem to be helping. She states that her biggest concern today is that she has not received her next prescription that she was supposed to begin last Thursday. She states she gets "flustered" when she calls the drug company when they ask her questions she doesn't understand.  Patient denies any pain. Denies any new lumps or bumps. Continued diarrhea  but controlled with daily imodium.   No nausea no vomiting.No marked. No headaches. Denies any rash palms and soles. No chest pain or shortness of breath or cough.  REVIEW OF SYSTEMS:  A complete 10 point review of system is done which is negative except mentioned above/history of present illness.   PAST MEDICAL HISTORY :  Past Medical History:  Diagnosis Date  . Arthritis   . Cancer ([facility]    GIST  . GERD (gastroesophageal reflux disease)   . GIST (gastrointestinal stroma tumor), malignant, colon ([place]   . History of hiatal hernia   . Hypertension    NO MEDS NOW  . Tremors of nervous system    HEAD    PAST SURGICAL HISTORY :   Past Surgical History:  Procedure Laterality Date  . ABDOMINAL HYSTERECTOMY    . BREAST BIOPSY Right   . CATARACT EXTRACTION W/PHACO Right 01/28/2015   Procedure: CATARACT EXTRACTION PHACO AND INTRAOCULAR LENS PLACEMENT (IOC);  Surgeon: SEstill Cotta MD;  Location: [facility] ORS;  Service: Ophthalmology;  Laterality: Right;  [place]:59.3 AP% 22.6 CDE 24.69 fluid pack lot ##[id number]  . CATARACT EXTRACTION W/PHACO Left 02/11/2015   Procedure: CATARACT EXTRACTION PHACO AND INTRAOCULAR LENS PLACEMENT (IOC);  Surgeon: SEstill Cotta MD;  Location: [facility] ORS;  Service: Ophthalmology;  Laterality: Left;  [place]:18 AP% 22.8 CDE 32.38  fluid pack lot # [id number]  . COLON SURGERY     COLOSTOMY AND REVERSAL  . STOMACH SURGERY  CANCER    FAMILY HISTORY :   Family History  Problem Relation Age of Onset  . Breast cancer Maternal Grandmother     SOCIAL HISTORY:   Social History  Substance Use Topics  . Smoking status: Never Smoker  . Smokeless tobacco: Not on file  . Alcohol use No    ALLERGIES:  is allergic to other and penicillins.  MEDICATIONS:  Current Outpatient Prescriptions  Medication Sig Dispense Refill  . Cetirizine HCl (ZYRTEC PO) Take by mouth.    . Cholecalciferol (VITAMIN D3 ADULT GUMMIES) 1000 units CHEW Chew 1 each by mouth daily.     . Cyanocobalamin (RA VITAMIN B-12 TR) 1000 MCG TBCR Take by mouth.    . Iron-Vitamin C (VITRON-C) 65-125 MG TABS Take by mouth.    Marland Kitchen lisinopril (PRINIVIL) 10 MG tablet Take 1 tablet (10 mg total) by mouth daily. 30 tablet 3  . Loperamide HCl (IMODIUM A-D PO) Take 1 tablet by mouth as needed.    . megestrol (MEGACE) 40 MG tablet take 1 tablet three times a week  2  . mometasone (NASONEX) 50 MCG/ACT nasal spray 50 sprays.    . Multiple Vitamin (MULTIVITAMIN) tablet Take 1 tablet by mouth daily.    . multivitamin-lutein (OCUVITE-LUTEIN) CAPS capsule Take 1 capsule by mouth daily.    . potassium chloride SA (K-DUR,KLOR-CON) 20 MEQ tablet Take 1 tablet (20 mEq total) by mouth daily. 30 tablet 0  . ranitidine (ZANTAC) 150 MG tablet Take by mouth.    . SUNItinib (SUTENT) 25 MG capsule Take 1 capsule by mouth daily for 14 days then 7 days off. 14 capsule 6   No current facility-administered medications for this visit.     PHYSICAL EXAMINATION: ECOG PERFORMANCE STATUS: 0 - Asymptomatic  BP 136/80 (BP Location: Left Arm, Patient Position: Sitting)   Pulse (!) 101   Temp (!) 96.5 F (35.8 C) (Tympanic)   Wt 133 lb 2 oz (60.4 kg)   BMI 26.89 kg/m   Filed Weights   04/06/16 1042  Weight: 133 lb 2 oz (60.4 kg)    GENERAL: Thin built moderately nourished; Caucasian female patient Alert, no distress and comfortable. She is alone.  EYES: no pallor or icterus OROPHARYNX: no thrush or ulceration; poor dentition  NECK: supple, no masses felt LYMPH:  no palpable lymphadenopathy in the cervical, axillary or inguinal regions LUNGS: clear to auscultation and  No wheeze or crackles HEART/CVS: regular rate & rhythm and no murmurs; No lower extremity edema ABDOMEN:abdomen soft, non-tender and normal bowel sounds Musculoskeletal:no cyanosis of digits and no clubbing  PSYCH: alert & oriented x 3 with fluent speech NEURO: no focal motor/sensory deficits SKIN:  no rashes or significant  lesions  LABORATORY DATA:  I have reviewed the data as listed    Component Value Date/Time   NA 138 04/06/2016 0948   NA 140 07/12/2014 0902   K 3.8 04/06/2016 0948   K 3.1 (L) 07/12/2014 0902   CL 107 04/06/2016 0948   CL 104 07/12/2014 0902   CO2 27 04/06/2016 0948   CO2 31 07/12/2014 0902   GLUCOSE 86 04/06/2016 0948   GLUCOSE 100 (H) 07/12/2014 0902   BUN 21 (H) 04/06/2016 0948   BUN 13 07/12/2014 0902   CREATININE 0.81 04/06/2016 0948   CREATININE 0.49 07/12/2014 0902   CALCIUM 9.1 04/06/2016 0948   CALCIUM 9.1 07/12/2014 0902   PROT 6.3 (L) 04/06/2016 0948   PROT 6.8 07/12/2014 0902   ALBUMIN 4.0 04/06/2016  0948   ALBUMIN 3.3 (L) 07/12/2014 0902   AST 20 04/06/2016 0948   AST 15 07/12/2014 0902   ALT 18 04/06/2016 0948   ALT 11 (L) 07/12/2014 0902   ALKPHOS 43 04/06/2016 0948   ALKPHOS 73 07/12/2014 0902   BILITOT 0.7 04/06/2016 0948   BILITOT 0.5 07/12/2014 0902   GFRNONAA >60 04/06/2016 0948   GFRNONAA >60 07/12/2014 0902   GFRAA >60 04/06/2016 0948   GFRAA >60 07/12/2014 0902    No results found for: SPEP, UPEP  Lab Results  Component Value Date   WBC 2.9 (L) 04/06/2016   NEUTROABS 1.8 04/06/2016   HGB 12.1 04/06/2016   HCT 34.8 (L) 04/06/2016   MCV 106.6 (H) 04/06/2016   PLT 195 04/06/2016      Chemistry      Component Value Date/Time   NA 138 04/06/2016 0948   NA 140 07/12/2014 0902   K 3.8 04/06/2016 0948   K 3.1 (L) 07/12/2014 0902   CL 107 04/06/2016 0948   CL 104 07/12/2014 0902   CO2 27 04/06/2016 0948   CO2 31 07/12/2014 0902   BUN 21 (H) 04/06/2016 0948   BUN 13 07/12/2014 0902   CREATININE 0.81 04/06/2016 0948   CREATININE 0.49 07/12/2014 0902      Component Value Date/Time   CALCIUM 9.1 04/06/2016 0948   CALCIUM 9.1 07/12/2014 0902   ALKPHOS 43 04/06/2016 0948   ALKPHOS 73 07/12/2014 0902   AST 20 04/06/2016 0948   AST 15 07/12/2014 0902   ALT 18 04/06/2016 0948   ALT 11 (L) 07/12/2014 0902   BILITOT 0.7 04/06/2016 0948    BILITOT 0.5 07/12/2014 0902     IMPRESSION: 1. No evidence disease progression in the abdomen pelvis. 2. Slight interval decrease in periaortic adenopathy. 3. Stable to slight enlargement of cystic and solid lesions along the LEFT external iliac nodal station. Portions of these lesions are hypermetabolic comparison PET-CT scan. 4. No change in size of lesion adjacent to the vaginal cuff and rectum which is not hypermetabolic on comparison PET-CT scan. 5. Thickening along the posterior wall the bladder is indeterminate. Recommend clinical correlation for hematuria or cystitis and consider cystoscopy.   Electronically Signed   By: Suzy Bouchard M.D.   On: 02/17/2016 12:14  RADIOGRAPHIC STUDIES: I have personally reviewed the radiological images as listed and agreed with the findings in the report. No results found.   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 Clinically no evidence of progression noted- patient tolerating Sutent well- except AEs as discussed below. CT NOV 27th- STABLE.   # Continue Sutent 25 mg 2 weeks ON; 1 week OFF [sec to AEs]; tolerating well except for mucositis/diarhea. She is having trouble getting her prescription. [place] number given to patient last week and today explaining that she needs to contact then directly because they need some information updated. She states she will call back today.   # Mucositis: Salt-baking soda rinses. States it stings her mouth and she does not use QID. She uses only PRN. Not taking magic mouthwash due to edema of the tounge.   # Diarrhea- Much better. Must take Imodium daily.  # elevated BP-Sec to Sutent- Much better today. Continue to monitor. Today 130's/80's. Continue  Lisinopril-41m/day.   # follow up in 6 weeks/ labs.      No orders  of the defined types were placed in this  encounter.  Faythe Casa, NP    Jacquelin Hawking, NP 04/06/2016 11:37 AM

## 2016-04-06 NOTE — Assessment & Plan Note (Addendum)
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 Clinically no evidence of progression noted- patient tolerating Sutent well- except AEs as discussed below. CT NOV 27th- STABLE.   # Continue Sutent 25 mg 2 weeks ON; 1 week OFF [sec to AEs]; tolerating well except for mucositis/diarhea. She is having trouble getting her prescription. [place] number given to patient last week and today explaining that she needs to contact then directly because they need some information updated. She states she will call back today.   # Mucositis: Salt-baking soda rinses. States it stings her mouth and she does not use QID. She uses only PRN. Not taking magic mouthwash due to edema of the tounge.   # Diarrhea- Much better. Must take Imodium daily.  # elevated BP-Sec to Sutent- Much better today. Continue to monitor. Today 130's/80's. Continue  Lisinopril-10mg /day.   # follow up in 6 weeks/ labs.

## 2016-04-07 ENCOUNTER — Other Ambulatory Visit: Payer: Self-pay | Admitting: Internal Medicine

## 2016-04-07 DIAGNOSIS — Z1231 Encounter for screening mammogram for malignant neoplasm of breast: Secondary | ICD-10-CM

## 2016-05-05 ENCOUNTER — Encounter: Payer: Self-pay | Admitting: Radiology

## 2016-05-05 ENCOUNTER — Ambulatory Visit
Admission: RE | Admit: 2016-05-05 | Discharge: 2016-05-05 | Disposition: A | Discharge status: Home / Self Care | Payer: PPO | Source: Ambulatory Visit | Attending: Internal Medicine | Admitting: Internal Medicine

## 2016-05-05 DIAGNOSIS — Z1231 Encounter for screening mammogram for malignant neoplasm of breast: Secondary | ICD-10-CM | POA: Diagnosis not present

## 2016-05-18 ENCOUNTER — Ambulatory Visit: Payer: PPO | Admitting: Internal Medicine

## 2016-05-18 ENCOUNTER — Other Ambulatory Visit: Payer: Self-pay | Admitting: Internal Medicine

## 2016-05-18 ENCOUNTER — Other Ambulatory Visit: Payer: PPO

## 2016-05-18 DIAGNOSIS — M659 Synovitis and tenosynovitis, unspecified: Secondary | ICD-10-CM | POA: Diagnosis not present

## 2016-05-18 DIAGNOSIS — G5701 Lesion of sciatic nerve, right lower limb: Secondary | ICD-10-CM | POA: Diagnosis not present

## 2016-05-18 DIAGNOSIS — R1031 Right lower quadrant pain: Secondary | ICD-10-CM

## 2016-05-21 ENCOUNTER — Other Ambulatory Visit: Payer: Self-pay | Admitting: Internal Medicine

## 2016-05-22 ENCOUNTER — Other Ambulatory Visit: Payer: Self-pay | Admitting: *Deleted

## 2016-05-22 DIAGNOSIS — C49A0 Gastrointestinal stromal tumor, unspecified site: Secondary | ICD-10-CM

## 2016-05-25 ENCOUNTER — Inpatient Hospital Stay: Payer: PPO | Attending: Internal Medicine

## 2016-05-25 ENCOUNTER — Inpatient Hospital Stay (HOSPITAL_BASED_OUTPATIENT_CLINIC_OR_DEPARTMENT_OTHER): Payer: PPO | Admitting: Internal Medicine

## 2016-05-25 VITALS — BP 152/86 | HR 88 | Temp 97.2°F | Wt 131.2 lb

## 2016-05-25 DIAGNOSIS — R590 Localized enlarged lymph nodes: Secondary | ICD-10-CM | POA: Insufficient documentation

## 2016-05-25 DIAGNOSIS — M549 Dorsalgia, unspecified: Secondary | ICD-10-CM | POA: Diagnosis not present

## 2016-05-25 DIAGNOSIS — C49A0 Gastrointestinal stromal tumor, unspecified site: Secondary | ICD-10-CM

## 2016-05-25 DIAGNOSIS — C49A4 Gastrointestinal stromal tumor of large intestine: Secondary | ICD-10-CM

## 2016-05-25 DIAGNOSIS — R197 Diarrhea, unspecified: Secondary | ICD-10-CM | POA: Insufficient documentation

## 2016-05-25 DIAGNOSIS — M25551 Pain in right hip: Secondary | ICD-10-CM | POA: Diagnosis not present

## 2016-05-25 DIAGNOSIS — K219 Gastro-esophageal reflux disease without esophagitis: Secondary | ICD-10-CM | POA: Insufficient documentation

## 2016-05-25 DIAGNOSIS — I10 Essential (primary) hypertension: Secondary | ICD-10-CM | POA: Insufficient documentation

## 2016-05-25 DIAGNOSIS — C786 Secondary malignant neoplasm of retroperitoneum and peritoneum: Secondary | ICD-10-CM | POA: Diagnosis not present

## 2016-05-25 DIAGNOSIS — Z79899 Other long term (current) drug therapy: Secondary | ICD-10-CM

## 2016-05-25 DIAGNOSIS — Z803 Family history of malignant neoplasm of breast: Secondary | ICD-10-CM | POA: Diagnosis not present

## 2016-05-25 DIAGNOSIS — Z880 Allergy status to penicillin: Secondary | ICD-10-CM | POA: Diagnosis not present

## 2016-05-25 DIAGNOSIS — C269 Malignant neoplasm of ill-defined sites within the digestive system: Secondary | ICD-10-CM

## 2016-05-25 LAB — COMPREHENSIVE METABOLIC PANEL
ALBUMIN: 4.3 g/dL (ref 3.5–5.0)
ALT: 34 U/L (ref 14–54)
AST: 29 U/L (ref 15–41)
Alkaline Phosphatase: 49 U/L (ref 38–126)
Anion gap: 3 — ABNORMAL LOW (ref 5–15)
BILIRUBIN TOTAL: 0.6 mg/dL (ref 0.3–1.2)
BUN: 22 mg/dL — ABNORMAL HIGH (ref 6–20)
CO2: 27 mmol/L (ref 22–32)
Calcium: 9.5 mg/dL (ref 8.9–10.3)
Chloride: 103 mmol/L (ref 101–111)
Creatinine, Ser: 0.75 mg/dL (ref 0.44–1.00)
GFR calc Af Amer: >60 mL/min (ref >60)
Glucose, Bld: 98 mg/dL (ref 65–99)
POTASSIUM: 4 mmol/L (ref 3.5–5.1)
Sodium: 133 mmol/L — ABNORMAL LOW (ref 135–145)
TOTAL PROTEIN: 6.7 g/dL (ref 6.5–8.1)

## 2016-05-25 LAB — CBC WITH DIFFERENTIAL/PLATELET
BASOS PCT: 0 %
Basophils Absolute: 0 10*3/uL (ref 0–0.1)
EOS ABS: 0 10*3/uL (ref 0–0.7)
EOS PCT: 0 %
HCT: 34.1 % — ABNORMAL LOW (ref 35.0–47.0)
HEMOGLOBIN: 12.1 g/dL (ref 12.0–16.0)
LYMPHS ABS: 1.1 10*3/uL (ref 1.0–3.6)
Lymphocytes Relative: 23 %
MCH: 38.2 pg — AB (ref 26.0–34.0)
MCHC: 35.4 g/dL (ref 32.0–36.0)
MCV: 107.8 fL — ABNORMAL HIGH (ref 80.0–100.0)
MONO ABS: 0.3 10*3/uL (ref 0.2–0.9)
MONOS PCT: 7 %
NEUTROS PCT: 70 %
Neutro Abs: 3.2 10*3/uL (ref 1.4–6.5)
PLATELETS: 304 10*3/uL (ref 150–440)
RBC: 3.16 MIL/uL — ABNORMAL LOW (ref 3.80–5.20)
RDW: 13.2 % (ref 11.5–14.5)
WBC: 4.6 10*3/uL (ref 3.6–11.0)

## 2016-05-25 NOTE — Assessment & Plan Note (Addendum)
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 Clinically no evidence of progression noted- patient tolerating Sutent well- except AEs as discussed below. CT NOV 27th- STABLE; however- worsening back pain [see discussion below].   # Continue Sutent 25 mg 2 weeks ON; 1 week OFF [sec to AEs]; tolerating well except for mucositis/diarhea [improved]. Continue sutent for now.   # Mucositis: Salt-baking soda rinses.  # Diarrhea- Much better; on prn imodium,   # elevated BP-Sec to Sutent- systolic [id number]. Continue  Lisinopril-10mg /day.   # right lower back/hip pain- s/p steroid injection-etiology progressive malignancy versus musculoskeletal. Await CT A/P planned for next week.   # follow up in 4 weeks/ labs.

## 2016-05-25 NOTE — Progress Notes (Signed)
Patient here today for follow up.   

## 2016-05-25 NOTE — Progress Notes (Signed)
[place] OFFICE PROGRESS NOTE  Patient Care Team: Rusty Aus, MD as PCP - General (Internal Medicine)  No matching staging information was found for the patient.   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 Biopsy of the mass showed a CD117 positive spindle cell tumor consistent with a GIST; 02/21/14 showed primary mass in mesentery with a peritoneal nodule in right posterior pelvis and nodule in pelvic cul de sac] ; Surgery April-May 2017 @[facility]   # GIST-June 2016- on Gleevec 340m alternating with 400  # FEB 2017- CT marked Progression of RP/pelvic sidewall LN; new paraesophageal LN; March 2017- Started Sutent 25 mg 3W-On 7 one week OFF [sec to SEs]  # July 27th PET- Significant PR; Sutent 2 w-On & 1 w OFF;  CT 11/27-STABLE DISEASE.   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 Prognostic/predictive value]     [place]   04/04/2014 Initial Diagnosis    Malignant neoplasm of gastrointestinal tract ([facility]        INTERVAL HISTORY:  Courtney [name] 76y.o.  female pleasant patient above history of gist metastatic- is here for follow-up patient is currently on sunitinib since March 2017.   Patient the last week or so complains of worsening pain in her right hip/lower back- especially after lifting heavy crate of water bottles. The patient received steroid injection to the back; had temporary relief. However noted to have worsening pain again. She is awaiting to have a CT scan with her PCP of the abdomen pelvis next week.  No nausea no vomiting.No marked. No headaches. Denies any rash palms and soles. No chest pain or shortness of breath or cough. She continues use baking soda and  salt rinses for her oral source. Mild intermittent diarrhea. On Imodium when necessary.  REVIEW OF SYSTEMS:  A complete 10 point review of system is done which is negative except mentioned above/history of present illness.   PAST MEDICAL HISTORY :  Past Medical History:  Diagnosis Date  . Arthritis   . Cancer ([facility]    GIST  . GERD (gastroesophageal reflux disease)   . GIST (gastrointestinal stroma tumor), malignant, colon ([place]   . History of hiatal hernia   . Hypertension    NO MEDS NOW  . Tremors of nervous system    HEAD    PAST SURGICAL HISTORY :   Past Surgical History:  Procedure Laterality Date  . ABDOMINAL HYSTERECTOMY    . BREAST BIOPSY Right   . CATARACT EXTRACTION W/PHACO Right 01/28/2015   Procedure: CATARACT EXTRACTION PHACO AND INTRAOCULAR LENS PLACEMENT (IOC);  Surgeon: SEstill Cotta MD;  Location: [facility] ORS;  Service: Ophthalmology;  Laterality: Right;  [place]:59.3 AP% 22.6 CDE 24.69 fluid pack lot ##[id number]  . CATARACT EXTRACTION W/PHACO Left 02/11/2015   Procedure: CATARACT EXTRACTION PHACO AND INTRAOCULAR LENS PLACEMENT (IOC);  Surgeon: SEstill Cotta MD;  Location: [facility] ORS;  Service: Ophthalmology;  Laterality: Left;  [place]01:18 AP% 22.8 CDE 32.38  fluid pack lot # [id number]  . COLON SURGERY     COLOSTOMY AND REVERSAL  . STOMACH SURGERY     CANCER    FAMILY HISTORY :   Family History  Problem Relation Age  of Onset  . Breast cancer Maternal Grandmother     SOCIAL HISTORY:   Social History  Substance Use Topics  . Smoking status: Never Smoker  . Smokeless tobacco: Not on file  . Alcohol use No    ALLERGIES:  is allergic to other and penicillins.  MEDICATIONS:  Current Outpatient Prescriptions  Medication Sig Dispense Refill  . Cetirizine HCl (ZYRTEC PO) Take by mouth.    . Cholecalciferol (VITAMIN D3 ADULT GUMMIES) 1000 units CHEW Chew 1 each by mouth daily.    . Cyanocobalamin (RA VITAMIN B-12 TR) 1000 MCG TBCR Take by mouth.    .  Iron-Vitamin C (VITRON-C) 65-125 MG TABS Take by mouth.    Marland Kitchen lisinopril (PRINIVIL) 10 MG tablet Take 1 tablet (10 mg total) by mouth daily. 30 tablet 3  . Loperamide HCl (IMODIUM A-D PO) Take 1 tablet by mouth as needed.    . megestrol (MEGACE) 40 MG tablet take 1 tablet three times a week  2  . mometasone (NASONEX) 50 MCG/ACT nasal spray 50 sprays.    . Multiple Vitamin (MULTIVITAMIN) tablet Take 1 tablet by mouth daily.    . multivitamin-lutein (OCUVITE-LUTEIN) CAPS capsule Take 1 capsule by mouth daily.    . potassium chloride SA (K-DUR,KLOR-CON) 20 MEQ tablet Take 1 tablet (20 mEq total) by mouth daily. 30 tablet 0  . ranitidine (ZANTAC) 150 MG tablet Take 150 mg by mouth 2 (two) times daily.     . SUNItinib (SUTENT) 25 MG capsule Take 1 capsule by mouth daily for 14 days then 7 days off. 14 capsule 6   No current facility-administered medications for this visit.     PHYSICAL EXAMINATION: ECOG PERFORMANCE STATUS: 0 - Asymptomatic  BP (!) 152/86 (BP Location: Left Arm, Patient Position: Sitting)   Pulse 88   Temp 97.2 F (36.2 C) (Tympanic)   Wt 131 lb 4 oz (59.5 kg)   BMI 26.51 kg/m   Filed Weights   05/25/16 0943  Weight: 131 lb 4 oz (59.5 kg)    GENERAL: Thin built moderately nourished; Caucasian female patient Alert, no distress and comfortable. She is alone.  EYES: no pallor or icterus OROPHARYNX: no thrush or ulceration; poor dentition  NECK: supple, no masses felt LYMPH:  no palpable lymphadenopathy in the cervical, axillary or inguinal regions LUNGS: clear to auscultation and  No wheeze or crackles HEART/CVS: regular rate & rhythm and no murmurs; No lower extremity edema ABDOMEN:abdomen soft, non-tender and normal bowel sounds Musculoskeletal:no cyanosis of digits and no clubbing  PSYCH: alert & oriented x 3 with fluent speech NEURO: no focal motor/sensory deficits SKIN:  no rashes or significant lesions  LABORATORY DATA:  I have reviewed the data as listed     Component Value Date/Time   NA 133 (L) 05/25/2016 0850   NA 140 07/12/2014 0902   K 4.0 05/25/2016 0850   K 3.1 (L) 07/12/2014 0902   CL 103 05/25/2016 0850   CL 104 07/12/2014 0902   CO2 27 05/25/2016 0850   CO2 31 07/12/2014 0902   GLUCOSE 98 05/25/2016 0850   GLUCOSE 100 (H) 07/12/2014 0902   BUN 22 (H) 05/25/2016 0850   BUN 13 07/12/2014 0902   CREATININE 0.75 05/25/2016 0850   CREATININE 0.49 07/12/2014 0902   CALCIUM 9.5 05/25/2016 0850   CALCIUM 9.1 07/12/2014 0902   PROT 6.7 05/25/2016 0850   PROT 6.8 07/12/2014 0902   ALBUMIN 4.3 05/25/2016 0850   ALBUMIN 3.3 (L) 07/12/2014 6237  AST 29 05/25/2016 0850   AST 15 07/12/2014 0902   ALT 34 05/25/2016 0850   ALT 11 (L) 07/12/2014 0902   ALKPHOS 49 05/25/2016 0850   ALKPHOS 73 07/12/2014 0902   BILITOT 0.6 05/25/2016 0850   BILITOT 0.5 07/12/2014 0902   GFRNONAA >60 05/25/2016 0850   GFRNONAA >60 07/12/2014 0902   GFRAA >60 05/25/2016 0850   GFRAA >60 07/12/2014 0902    No results found for: SPEP, UPEP  Lab Results  Component Value Date   WBC 4.6 05/25/2016   NEUTROABS 3.2 05/25/2016   HGB 12.1 05/25/2016   HCT 34.1 (L) 05/25/2016   MCV 107.8 (H) 05/25/2016   PLT 304 05/25/2016      Chemistry      Component Value Date/Time   NA 133 (L) 05/25/2016 0850   NA 140 07/12/2014 0902   K 4.0 05/25/2016 0850   K 3.1 (L) 07/12/2014 0902   CL 103 05/25/2016 0850   CL 104 07/12/2014 0902   CO2 27 05/25/2016 0850   CO2 31 07/12/2014 0902   BUN 22 (H) 05/25/2016 0850   BUN 13 07/12/2014 0902   CREATININE 0.75 05/25/2016 0850   CREATININE 0.49 07/12/2014 0902      Component Value Date/Time   CALCIUM 9.5 05/25/2016 0850   CALCIUM 9.1 07/12/2014 0902   ALKPHOS 49 05/25/2016 0850   ALKPHOS 73 07/12/2014 0902   AST 29 05/25/2016 0850   AST 15 07/12/2014 0902   ALT 34 05/25/2016 0850   ALT 11 (L) 07/12/2014 0902   BILITOT 0.6 05/25/2016 0850   BILITOT 0.5 07/12/2014 0902     IMPRESSION: 1. No evidence  disease progression in the abdomen pelvis. 2. Slight interval decrease in periaortic adenopathy. 3. Stable to slight enlargement of cystic and solid lesions along the LEFT external iliac nodal station. Portions of these lesions are hypermetabolic comparison PET-CT scan. 4. No change in size of lesion adjacent to the vaginal cuff and rectum which is not hypermetabolic on comparison PET-CT scan. 5. Thickening along the posterior wall the bladder is indeterminate. Recommend clinical correlation for hematuria or cystitis and consider cystoscopy.   Electronically Signed   By: Suzy Bouchard M.D.   On: 02/17/2016 12:14  RADIOGRAPHIC STUDIES: I have personally reviewed the radiological images as listed and agreed with the findings in the report. No results found.   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 Clinically no evidence of progression noted- patient tolerating Sutent well- except AEs as discussed below. CT NOV 27th- STABLE; however- worsening back pain [see discussion below].   # Continue Sutent 25 mg 2 weeks ON; 1 week OFF [sec to AEs]; tolerating well except for mucositis/diarhea [improved]. Continue sutent for now.   # Mucositis: Salt-baking soda rinses.  # Diarrhea- Much better; on prn imodium,   # elevated BP-Sec to Sutent- systolic 622Q. Continue  Lisinopril-33m/day.   # right lower back/hip pain- s/p steroid injection-etiology progressive malignancy versus musculoskeletal. Await CT A/P planned for next week.   # follow up in 4 weeks/ labs.    Orders Placed This Encounter  Procedures  . CBC with Differential    Standing Status:   Future    Standing Expiration Date:   05/25/2017  . Comprehensive metabolic panel    Standing Status:   Future    Standing Expiration Date:   05/25/2017  Cammie Sickle, MD 05/25/2016 1:24  PM

## 2016-06-01 ENCOUNTER — Ambulatory Visit
Admission: RE | Admit: 2016-06-01 | Discharge: 2016-06-01 | Disposition: A | Discharge status: Home / Self Care | Payer: PPO | Source: Ambulatory Visit | Attending: Internal Medicine | Admitting: Internal Medicine

## 2016-06-01 DIAGNOSIS — C49A2 Gastrointestinal stromal tumor of stomach: Secondary | ICD-10-CM | POA: Diagnosis not present

## 2016-06-01 DIAGNOSIS — Z0000 Encounter for general adult medical examination without abnormal findings: Secondary | ICD-10-CM | POA: Diagnosis not present

## 2016-06-01 DIAGNOSIS — R109 Unspecified abdominal pain: Secondary | ICD-10-CM | POA: Diagnosis not present

## 2016-06-01 DIAGNOSIS — Z85038 Personal history of other malignant neoplasm of large intestine: Secondary | ICD-10-CM | POA: Diagnosis not present

## 2016-06-01 DIAGNOSIS — R1031 Right lower quadrant pain: Secondary | ICD-10-CM

## 2016-06-01 DIAGNOSIS — R1907 Generalized intra-abdominal and pelvic swelling, mass and lump: Secondary | ICD-10-CM | POA: Diagnosis not present

## 2016-06-01 MED ORDER — IOPAMIDOL (ISOVUE-300) INJECTION 61%
100.0000 mL | Freq: Once | INTRAVENOUS | Status: AC | PRN
  Administered 2016-06-01: 100 mL via INTRAVENOUS

## 2016-06-22 ENCOUNTER — Inpatient Hospital Stay (HOSPITAL_BASED_OUTPATIENT_CLINIC_OR_DEPARTMENT_OTHER): Payer: PPO | Admitting: Internal Medicine

## 2016-06-22 ENCOUNTER — Inpatient Hospital Stay: Payer: PPO | Attending: Internal Medicine

## 2016-06-22 VITALS — BP 155/86 | HR 86 | Temp 97.0°F | Ht 64.0 in | Wt 127.6 lb

## 2016-06-22 DIAGNOSIS — M545 Low back pain: Secondary | ICD-10-CM

## 2016-06-22 DIAGNOSIS — R590 Localized enlarged lymph nodes: Secondary | ICD-10-CM | POA: Diagnosis not present

## 2016-06-22 DIAGNOSIS — C786 Secondary malignant neoplasm of retroperitoneum and peritoneum: Secondary | ICD-10-CM

## 2016-06-22 DIAGNOSIS — Z803 Family history of malignant neoplasm of breast: Secondary | ICD-10-CM | POA: Insufficient documentation

## 2016-06-22 DIAGNOSIS — C49A4 Gastrointestinal stromal tumor of large intestine: Secondary | ICD-10-CM

## 2016-06-22 DIAGNOSIS — Z79899 Other long term (current) drug therapy: Secondary | ICD-10-CM | POA: Diagnosis not present

## 2016-06-22 DIAGNOSIS — K219 Gastro-esophageal reflux disease without esophagitis: Secondary | ICD-10-CM | POA: Diagnosis not present

## 2016-06-22 DIAGNOSIS — C269 Malignant neoplasm of ill-defined sites within the digestive system: Secondary | ICD-10-CM

## 2016-06-22 DIAGNOSIS — Z880 Allergy status to penicillin: Secondary | ICD-10-CM | POA: Insufficient documentation

## 2016-06-22 DIAGNOSIS — I10 Essential (primary) hypertension: Secondary | ICD-10-CM

## 2016-06-22 DIAGNOSIS — R197 Diarrhea, unspecified: Secondary | ICD-10-CM | POA: Diagnosis not present

## 2016-06-22 DIAGNOSIS — M25551 Pain in right hip: Secondary | ICD-10-CM | POA: Diagnosis not present

## 2016-06-22 DIAGNOSIS — K1230 Oral mucositis (ulcerative), unspecified: Secondary | ICD-10-CM

## 2016-06-22 LAB — COMPREHENSIVE METABOLIC PANEL
ALBUMIN: 3.9 g/dL (ref 3.5–5.0)
ALT: 23 U/L (ref 14–54)
AST: 30 U/L (ref 15–41)
Alkaline Phosphatase: 45 U/L (ref 38–126)
Anion gap: 7 (ref 5–15)
BILIRUBIN TOTAL: 0.7 mg/dL (ref 0.3–1.2)
BUN: 21 mg/dL — AB (ref 6–20)
CO2: 23 mmol/L (ref 22–32)
CREATININE: 1.04 mg/dL — AB (ref 0.44–1.00)
Calcium: 9.2 mg/dL (ref 8.9–10.3)
Chloride: 108 mmol/L (ref 101–111)
GFR calc Af Amer: 59 mL/min — ABNORMAL LOW (ref >60)
GFR calc non Af Amer: 51 mL/min — ABNORMAL LOW (ref >60)
GLUCOSE: 120 mg/dL — AB (ref 65–99)
POTASSIUM: 3.8 mmol/L (ref 3.5–5.1)
Sodium: 138 mmol/L (ref 135–145)
TOTAL PROTEIN: 6.3 g/dL — AB (ref 6.5–8.1)

## 2016-06-22 LAB — CBC WITH DIFFERENTIAL/PLATELET
BASOS ABS: 0 10*3/uL (ref 0–0.1)
BASOS PCT: 0 %
Eosinophils Absolute: 0 10*3/uL (ref 0–0.7)
Eosinophils Relative: 1 %
HEMATOCRIT: 33.7 % — AB (ref 35.0–47.0)
Hemoglobin: 12 g/dL (ref 12.0–16.0)
LYMPHS PCT: 25 %
Lymphs Abs: 0.9 10*3/uL — ABNORMAL LOW (ref 1.0–3.6)
MCH: 39.1 pg — ABNORMAL HIGH (ref 26.0–34.0)
MCHC: 35.7 g/dL (ref 32.0–36.0)
MCV: 109.4 fL — ABNORMAL HIGH (ref 80.0–100.0)
MONO ABS: 0.2 10*3/uL (ref 0.2–0.9)
Monocytes Relative: 6 %
NEUTROS ABS: 2.5 10*3/uL (ref 1.4–6.5)
NEUTROS PCT: 68 %
Platelets: 315 10*3/uL (ref 150–440)
RBC: 3.08 MIL/uL — AB (ref 3.80–5.20)
RDW: 14.3 % (ref 11.5–14.5)
WBC: 3.7 10*3/uL (ref 3.6–11.0)

## 2016-06-22 NOTE — Progress Notes (Signed)
Abdomen.  Bilateral upper daily and I will recommend a one year rapidly therapy to the lung [place] OFFICE PROGRESS NOTE  Patient Care Team: Rusty Aus, MD as PCP - General (Internal Medicine)  No matching staging information was found for the patient.   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 Biopsy of the mass showed a CD117 positive spindle cell tumor consistent with a GIST; 02/21/14 showed primary mass in mesentery with a peritoneal nodule in right posterior pelvis and nodule in pelvic cul de sac] ; Surgery April-May 2017 @[facility]   # GIST-June 2016- on Gleevec 372m alternating with 400  # FEB 2017- CT marked Progression of RP/pelvic sidewall LN; new paraesophageal LN; March 2017- Started Sutent 25 mg 3W-On 7 one week OFF [sec to SEs]  # July 27th PET- Significant PR; Sutent 2 w-On & 1 w OFF;  CT 11/27-STABLE DISEASE.   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 Prognostic/predictive value]     Malignant ne[place] of gastrointestinal tract ([place]   04/04/2014 Initial Diagnosis    Malignant neoplasm of gastrointestinal tract ([facility]        INTERVAL HISTORY:  [name] 76y.o.  female pleasant patient above history of gist metastatic- is here for follow-up patient is currently on sunitinib since March 2017; Is also here to review the results of her CT scan that was ordered for worsening right hip pain/low back pain.   Interestingly her pain is resolved. Patient continues to be very active physically. She continues use baking soda and salt rinses for her oral sores; however exacerbated surgery certain foods/Dr. PMalachi Bonds  No nausea no vomiting.No marked. No headaches. Denies any rash palms and soles. No chest  pain or shortness of breath or cough.  Mild intermittent diarrhea. On Imodium when necessary.  REVIEW OF SYSTEMS:  A complete 10 point review of system is done which is negative except mentioned above/history of present illness.   PAST MEDICAL HISTORY :  Past Medical History:  Diagnosis Date  . Arthritis   . Cancer ([facility]    GIST  . GERD (gastroesophageal reflux disease)   . GIST (gastrointestinal stroma tumor), malignant, colon [place]   . History of hiatal hernia   . Hypertension    NO MEDS NOW  . Tremors of nervous system    HEAD    PAST SURGICAL HISTORY :   Past Surgical History:  Procedure Laterality Date  . ABDOMINAL HYSTERECTOMY    . BREAST BIOPSY Right   . CATARACT EXTRACTION W/PHACO Right 01/28/2015   Procedure: CATARACT EXTRACTION PHACO AND INTRAOCULAR LENS PLACEMENT (IOC);  Surgeon: SEstill Cotta MD;  Location: [facility] ORS;  Service: Ophthalmology;  Laterality: Right;  [place]:59.3 AP% 22.6 CDE 24.69 fluid pack lot ##[id number]  . CATARACT EXTRACTION W/PHACO Left 02/11/2015   Procedure: CATARACT EXTRACTION PHACO AND INTRAOCULAR LENS PLACEMENT (IOC);  Surgeon: SEstill Cotta MD;  Location: [facility] ORS;  Service: Ophthalmology;  Laterality: Left;  [place]:18 AP% 22.8 CDE 32.38  fluid pack lot # [id number]  . COLON SURGERY     COLOSTOMY AND REVERSAL  . STOMACH SURGERY     CANCER    FAMILY HISTORY :   Family History  Problem Relation Age of  Onset  . Breast cancer Maternal Grandmother     SOCIAL HISTORY:   Social History  Substance Use Topics  . Smoking status: Never Smoker  . Smokeless tobacco: Not on file  . Alcohol use No    ALLERGIES:  is allergic to other and penicillins.  MEDICATIONS:  Current Outpatient Prescriptions  Medication Sig Dispense Refill  . Cetirizine HCl (ZYRTEC PO) Take by mouth.    . Cholecalciferol (VITAMIN D3 ADULT GUMMIES) 1000 units CHEW Chew 1 each by mouth daily.    . Cyanocobalamin (RA VITAMIN B-12 TR) 1000 MCG TBCR Take by mouth.     . Iron-Vitamin C (VITRON-C) 65-125 MG TABS Take by mouth.    Marland Kitchen lisinopril (PRINIVIL) 10 MG tablet Take 1 tablet (10 mg total) by mouth daily. 30 tablet 3  . Loperamide HCl (IMODIUM A-D PO) Take 1 tablet by mouth as needed.    . megestrol (MEGACE) 40 MG tablet take 1 tablet three times a week  2  . mometasone (NASONEX) 50 MCG/ACT nasal spray 50 sprays.    . Multiple Vitamin (MULTIVITAMIN) tablet Take 1 tablet by mouth daily.    . multivitamin-lutein (OCUVITE-LUTEIN) CAPS capsule Take 1 capsule by mouth daily.    . potassium chloride SA (K-DUR,KLOR-CON) 20 MEQ tablet Take 1 tablet (20 mEq total) by mouth daily. 30 tablet 0  . ranitidine (ZANTAC) 150 MG tablet Take 150 mg by mouth 2 (two) times daily.     . SUNItinib (SUTENT) 25 MG capsule Take 1 capsule by mouth daily for 14 days then 7 days off. 14 capsule 6   No current facility-administered medications for this visit.     PHYSICAL EXAMINATION: ECOG PERFORMANCE STATUS: 0 - Asymptomatic  BP (!) 155/86 (BP Location: Right Arm, Patient Position: Sitting)   Pulse 86   Temp 97 F (36.1 C) (Tympanic)   Ht 5' 4"  (1.626 m)   Wt 127 lb 9.6 oz (57.9 kg)   BMI 21.90 kg/m   Filed Weights   06/22/16 1408  Weight: 127 lb 9.6 oz (57.9 kg)    GENERAL: Thin built moderately nourished; Caucasian female patient Alert, no distress and comfortable. She is alone.  EYES: no pallor or icterus OROPHARYNX: no thrush or ulceration; poor dentition  NECK: supple, no masses felt LYMPH:  no palpable lymphadenopathy in the cervical, axillary or inguinal regions LUNGS: clear to auscultation and  No wheeze or crackles HEART/CVS: regular rate & rhythm and no murmurs; No lower extremity edema ABDOMEN:abdomen soft, non-tender and normal bowel sounds Musculoskeletal:no cyanosis of digits and no clubbing  PSYCH: alert & oriented x 3 with fluent speech NEURO: no focal motor/sensory deficits SKIN:  no rashes or significant lesions  LABORATORY DATA:  I have  reviewed the data as listed    Component Value Date/Time   NA 138 06/22/2016 1345   NA 140 07/12/2014 0902   K 3.8 06/22/2016 1345   K 3.1 (L) 07/12/2014 0902   CL 108 06/22/2016 1345   CL 104 07/12/2014 0902   CO2 23 06/22/2016 1345   CO2 31 07/12/2014 0902   GLUCOSE 120 (H) 06/22/2016 1345   GLUCOSE 100 (H) 07/12/2014 0902   BUN 21 (H) 06/22/2016 1345   BUN 13 07/12/2014 0902   CREATININE 1.04 (H) 06/22/2016 1345   CREATININE 0.49 07/12/2014 0902   CALCIUM 9.2 06/22/2016 1345   CALCIUM 9.1 07/12/2014 0902   PROT 6.3 (L) 06/22/2016 1345   PROT 6.8 07/12/2014 0902   ALBUMIN 3.9 06/22/2016 1345  ALBUMIN 3.3 (L) 07/12/2014 0902   AST 30 06/22/2016 1345   AST 15 07/12/2014 0902   ALT 23 06/22/2016 1345   ALT 11 (L) 07/12/2014 0902   ALKPHOS 45 06/22/2016 1345   ALKPHOS 73 07/12/2014 0902   BILITOT 0.7 06/22/2016 1345   BILITOT 0.5 07/12/2014 0902   GFRNONAA 51 (L) 06/22/2016 1345   GFRNONAA >60 07/12/2014 0902   GFRAA 59 (L) 06/22/2016 1345   GFRAA >60 07/12/2014 0902    No results found for: SPEP, UPEP  Lab Results  Component Value Date   WBC 3.7 06/22/2016   NEUTROABS 2.5 06/22/2016   HGB 12.0 06/22/2016   HCT 33.7 (L) 06/22/2016   MCV 109.4 (H) 06/22/2016   PLT 315 06/22/2016      Chemistry      Component Value Date/Time   NA 138 06/22/2016 1345   NA 140 07/12/2014 0902   K 3.8 06/22/2016 1345   K 3.1 (L) 07/12/2014 0902   CL 108 06/22/2016 1345   CL 104 07/12/2014 0902   CO2 23 06/22/2016 1345   CO2 31 07/12/2014 0902   BUN 21 (H) 06/22/2016 1345   BUN 13 07/12/2014 0902   CREATININE 1.04 (H) 06/22/2016 1345   CREATININE 0.49 07/12/2014 0902      Component Value Date/Time   CALCIUM 9.2 06/22/2016 1345   CALCIUM 9.1 07/12/2014 0902   ALKPHOS 45 06/22/2016 1345   ALKPHOS 73 07/12/2014 0902   AST 30 06/22/2016 1345   AST 15 07/12/2014 0902   ALT 23 06/22/2016 1345   ALT 11 (L) 07/12/2014 0902   BILITOT 0.7 06/22/2016 1345   BILITOT 0.5  07/12/2014 0902     IMPRESSION: 1. No new or acute finding to explain right lower quadrant pain. 2. History of metastatic colon gist with known retroperitoneal adenopathy and pelvic nodule. A left external iliac node has decreased in size compared to November 2017. A retrocaval node has increased in size and decreased enhancement - attention on next restaging scan.   Electronically Signed   By: Monte Fantasia M.D.   On: 06/01/2016 08:53   RADIOGRAPHIC STUDIES: I have personally reviewed the radiological images as listed and agreed with the findings in the report. No results found.   ASSESSMENT & PLAN:  Malignant neoplasm of gastrointestinal tract ([place]) # Metastatic gist tumor small bowel- currently on Sunitinib 25 mg 2 weeks on 1 week off- since March 2017. Clinically no evidence of progression noted- patient tolerating Sutent well- except AEs as discussed below. CT MARCH 2018- STABLE/improved; retrocrural LN ~ 45m [previously 86m. Monitor for now.  Discussed that we will get CT scans- 4-6 months.   # Continue Sutent 25 mg 2 weeks ON; 1 week OFF [sec to AEs]; tolerating well except for mucositis/diarhea [improved]. Continue sutent for now.   # Mucositis: Salt-baking soda rinses. Better when hard candy limited.    # Diarrhea- Much better; on prn imodium,   # elevated BP-Sec to Sutent- systolic 15007HContinue  Lisinopril-1024may. Keep a log of blood pressure at home.   # follow up in 6 weeks/ labs.   # I reviewed the blood work- with the patient in detail; also reviewed the imaging independently [as summarized above]; and with the patient in detail.    Orders Placed This Encounter  Procedures  . CBC with Differential    Standing Status:   Future    Standing Expiration Date:   06/22/2017  . Comprehensive metabolic panel    Standing  Status:   Future    Standing Expiration Date:   06/22/2017      Cammie Sickle, MD 06/23/2016 1:22 PM

## 2016-06-22 NOTE — Progress Notes (Signed)
Patient here for follow up. She states she feels "great". No changes since last appointment.

## 2016-06-22 NOTE — Assessment & Plan Note (Addendum)
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 Clinically no evidence of progression noted- patient tolerating Sutent well- except AEs as discussed below. CT MARCH 2018- STABLE/improved; retrocrural LN ~ 49mm [previously 51mm]. Monitor for now.  Discussed that we will get CT scans- 4-6 months.   # Continue Sutent 25 mg 2 weeks ON; 1 week OFF [sec to AEs]; tolerating well except for mucositis/diarhea [improved]. Continue sutent for now.   # Mucositis: Salt-baking soda rinses. Better when hard candy limited.    # Diarrhea- Much better; on prn imodium,   # elevated BP-Sec to Sutent- systolic 629B. Continue  Lisinopril-10mg /day. Keep a log of blood pressure at home.   # follow up in 6 weeks/ labs.   # I reviewed the blood work- with the patient in detail; also reviewed the imaging independently [as summarized above]; and with the patient in detail.

## 2016-08-03 ENCOUNTER — Inpatient Hospital Stay (HOSPITAL_BASED_OUTPATIENT_CLINIC_OR_DEPARTMENT_OTHER): Payer: PPO | Admitting: Internal Medicine

## 2016-08-03 ENCOUNTER — Inpatient Hospital Stay: Payer: PPO | Attending: Internal Medicine

## 2016-08-03 VITALS — BP 121/71 | HR 72 | Temp 98.3°F | Resp 16 | Wt 127.5 lb

## 2016-08-03 DIAGNOSIS — Z803 Family history of malignant neoplasm of breast: Secondary | ICD-10-CM | POA: Diagnosis not present

## 2016-08-03 DIAGNOSIS — K219 Gastro-esophageal reflux disease without esophagitis: Secondary | ICD-10-CM | POA: Insufficient documentation

## 2016-08-03 DIAGNOSIS — C269 Malignant neoplasm of ill-defined sites within the digestive system: Secondary | ICD-10-CM

## 2016-08-03 DIAGNOSIS — K1230 Oral mucositis (ulcerative), unspecified: Secondary | ICD-10-CM | POA: Diagnosis not present

## 2016-08-03 DIAGNOSIS — Z880 Allergy status to penicillin: Secondary | ICD-10-CM

## 2016-08-03 DIAGNOSIS — D72819 Decreased white blood cell count, unspecified: Secondary | ICD-10-CM | POA: Insufficient documentation

## 2016-08-03 DIAGNOSIS — R1031 Right lower quadrant pain: Secondary | ICD-10-CM | POA: Diagnosis not present

## 2016-08-03 DIAGNOSIS — R197 Diarrhea, unspecified: Secondary | ICD-10-CM | POA: Diagnosis not present

## 2016-08-03 DIAGNOSIS — Z79899 Other long term (current) drug therapy: Secondary | ICD-10-CM

## 2016-08-03 DIAGNOSIS — I10 Essential (primary) hypertension: Secondary | ICD-10-CM | POA: Diagnosis not present

## 2016-08-03 DIAGNOSIS — M1990 Unspecified osteoarthritis, unspecified site: Secondary | ICD-10-CM

## 2016-08-03 DIAGNOSIS — C49A4 Gastrointestinal stromal tumor of large intestine: Secondary | ICD-10-CM | POA: Diagnosis not present

## 2016-08-03 LAB — COMPREHENSIVE METABOLIC PANEL
ALT: 17 U/L (ref 14–54)
ANION GAP: 8 (ref 5–15)
AST: 31 U/L (ref 15–41)
Albumin: 3.7 g/dL (ref 3.5–5.0)
Alkaline Phosphatase: 44 U/L (ref 38–126)
BILIRUBIN TOTAL: 0.7 mg/dL (ref 0.3–1.2)
BUN: 13 mg/dL (ref 6–20)
CHLORIDE: 108 mmol/L (ref 101–111)
CO2: 22 mmol/L (ref 22–32)
Calcium: 9 mg/dL (ref 8.9–10.3)
Creatinine, Ser: 0.96 mg/dL (ref 0.44–1.00)
GFR, EST NON AFRICAN AMERICAN: 56 mL/min — AB (ref >60)
Glucose, Bld: 113 mg/dL — ABNORMAL HIGH (ref 65–99)
POTASSIUM: 3.7 mmol/L (ref 3.5–5.1)
Sodium: 138 mmol/L (ref 135–145)
TOTAL PROTEIN: 5.9 g/dL — AB (ref 6.5–8.1)

## 2016-08-03 LAB — CBC WITH DIFFERENTIAL/PLATELET
Basophils Absolute: 0 10*3/uL (ref 0–0.1)
Basophils Relative: 0 %
Eosinophils Absolute: 0.1 10*3/uL (ref 0–0.7)
Eosinophils Relative: 2 %
HEMATOCRIT: 29.3 % — AB (ref 35.0–47.0)
Hemoglobin: 10.4 g/dL — ABNORMAL LOW (ref 12.0–16.0)
LYMPHS ABS: 0.8 10*3/uL — AB (ref 1.0–3.6)
LYMPHS PCT: 30 %
MCH: 39.7 pg — AB (ref 26.0–34.0)
MCHC: 35.6 g/dL (ref 32.0–36.0)
MCV: 111.6 fL — AB (ref 80.0–100.0)
MONOS PCT: 6 %
Monocytes Absolute: 0.2 10*3/uL (ref 0.2–0.9)
NEUTROS ABS: 1.7 10*3/uL (ref 1.4–6.5)
NEUTROS PCT: 62 %
PLATELETS: 209 10*3/uL (ref 150–440)
RBC: 2.63 MIL/uL — ABNORMAL LOW (ref 3.80–5.20)
RDW: 13.9 % (ref 11.5–14.5)
WBC: 2.7 10*3/uL — ABNORMAL LOW (ref 3.6–11.0)

## 2016-08-03 MED ORDER — DIPHENOXYLATE-ATROPINE 2.5-0.025 MG PO TABS: ORAL_TABLET | ORAL | 0 refills | Status: DC

## 2016-08-03 NOTE — Assessment & Plan Note (Addendum)
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 CT MARCH 2018- STABLE/improved; retrocrural LN ~ 80mm [previously 34mm]. Monitor for now.   # Clinically no evidence of progression noted- patient tolerating Sutent well- except AEs as discussed below.   # Continue Sutent 25 mg 2 weeks ON; 1 week OFF [sec to AEs]; tolerating well except for mucositis/diarhea [improved]. Labs- okay- except for slightly low Hb 10.5; slightly low WBC 2.7; ANC- 1.7. Continue sutent for now. Last dose of cycle today; will re-start in 1 week.   # Mucositis: Salt-baking soda rinses.    # Diarrhea- G-2- 4-5 loose stools ? Sutent- recommend lomotil.   # follow up in 8 weeks/ labs.

## 2016-08-03 NOTE — Progress Notes (Signed)
Patient here today for follow up.  Patient c/o diarrhea 5-8 times daily and sores on tongue

## 2016-08-03 NOTE — Progress Notes (Signed)
Abdomen.  Bilateral upper daily and I will recommend a one year rapidly therapy to the lung [place] OFFICE PROGRESS NOTE  Patient Care Team: Rusty Aus, MD as PCP - General (Internal Medicine)  No matching staging information was found for the patient.   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 Biopsy of the mass showed a CD117 positive spindle cell tumor consistent with a GIST; 02/21/14 showed primary mass in mesentery with a peritoneal nodule in right posterior pelvis and nodule in pelvic cul de sac] ; Surgery April-May 2017 @[facility]   # GIST-June 2016- on Gleevec 365m alternating with 400  # FEB 2017- CT marked Progression of RP/pelvic sidewall LN; new paraesophageal LN; March 2017- Started Sutent 25 mg 3W-On 7 one week OFF [sec to SEs]  # July 27th PET- Significant PR; Sutent 2 w-On & 1 w OFF;  CT 11/27-STABLE DISEASE.   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 Prognostic/predictive value]     Malignant neoplasm of gastrointestinal tract ([place]   04/04/2014 Initial Diagnosis    Malignant neoplasm of gastrointestinal tract ([facility]        INTERVAL HISTORY:  Courtney OverdorfHorner 76y.o.  female pleasant patient above history of gist metastatic- is here for follow-up patient is currently on sunitinib since March 2017; Is also here For follow-up.  Patient complains of diarrhea up to 4-5 loose stools each day 2-3 days. She attributes this to sunitinib. She had taken Imodium; slight improvement. Not completely resolved. No nausea no vomiting.No marked. No headaches. Denies any rash palms and soles. No chest pain or shortness of breath or cough.  She continues to be on Sutent 2 weeks on one week off. Today is her last day  of her cycle. Continues to have intermittent soreness in the mouth. Not any worse.  REVIEW OF SYSTEMS:  A complete 10 point review of system is done which is negative except mentioned above/history of present illness.   PAST MEDICAL HISTORY :  Past Medical History:  Diagnosis Date  . Arthritis   . Cancer ([facility]    GIST  . GERD (gastroesophageal reflux disease)   . GIST (gastrointestinal stroma tumor), malignant, [place] ([facility]   . History of hiatal hernia   . Hypertension    NO MEDS NOW  . Tremors of nervous system    HEAD    PAST SURGICAL HISTORY :   Past Surgical History:  Procedure Laterality Date  . ABDOMINAL HYSTERECTOMY    . BREAST BIOPSY Right   . CATARACT EXTRACTION W/PHACO Right 01/28/2015   Procedure: CATARACT EXTRACTION PHACO AND INTRAOCULAR LENS PLACEMENT (IOC);  Surgeon: SEstill Cotta MD;  Location: [facility] ORS;  Service: Ophthalmology;  Laterality: Right;  [place]:59.3 AP% 22.6 CDE 24.69 fluid pack lot ##[id number]  . CATARACT EXTRACTION W/PHACO Left 02/11/2015   Procedure: CATARACT EXTRACTION PHACO AND INTRAOCULAR LENS PLACEMENT (IOC);  Surgeon: SEstill Cotta MD;  Location: [facility] ORS;  Service: Ophthalmology;  Laterality: Left;  [place]:18 AP% 22.8 CDE 32.38  fluid pack lot # [id number]  . COLON SURGERY     COLOSTOMY AND REVERSAL  . STOMACH SURGERY     CANCER    FAMILY HISTORY :   Family History  Problem  Relation Age of Onset  . Breast cancer Maternal Grandmother     SOCIAL HISTORY:   Social History  Substance Use Topics  . Smoking status: Never Smoker  . Smokeless tobacco: Not on file  . Alcohol use No    ALLERGIES:  is allergic to other and penicillins.  MEDICATIONS:  Current Outpatient Prescriptions  Medication Sig Dispense Refill  . Cetirizine HCl (ZYRTEC PO) Take by mouth.    . Cyanocobalamin (RA VITAMIN B-12 TR) 1000 MCG TBCR Take by mouth.    . Iron-Vitamin C (VITRON-C) 65-125 MG TABS Take by mouth.    Marland Kitchen lisinopril (PRINIVIL) 10 MG tablet  Take 1 tablet (10 mg total) by mouth daily. 30 tablet 3  . Loperamide HCl (IMODIUM A-D PO) Take 1 tablet by mouth as needed.    . megestrol (MEGACE) 40 MG tablet daily  2  . mometasone (NASONEX) 50 MCG/ACT nasal spray 50 sprays.    . Multiple Vitamin (MULTIVITAMIN) tablet Take 1 tablet by mouth daily.    . potassium chloride SA (K-DUR,KLOR-CON) 20 MEQ tablet Take 1 tablet (20 mEq total) by mouth daily. 30 tablet 0  . ranitidine (ZANTAC) 150 MG tablet Take 150 mg by mouth 2 (two) times daily.     . SUNItinib (SUTENT) 25 MG capsule Take 1 capsule by mouth daily for 14 days then 7 days off. 14 capsule 6  . Cholecalciferol (VITAMIN D3 ADULT GUMMIES) 1000 units CHEW Chew 1 each by mouth daily.    . diphenoxylate-atropine (LOMOTIL) 2.5-0.025 MG tablet One pill every 8 hours as needed for diarrhea 60 tablet 0   No current facility-administered medications for this visit.     PHYSICAL EXAMINATION: ECOG PERFORMANCE STATUS: 0 - Asymptomatic  BP 121/71 (BP Location: Left Arm, Patient Position: Sitting)   Pulse 72   Temp 98.3 F (36.8 C) (Tympanic)   Resp 16   Wt 127 lb 8 oz (57.8 kg)   BMI 21.89 kg/m   Filed Weights   08/03/16 1344  Weight: 127 lb 8 oz (57.8 kg)    GENERAL: Thin built moderately nourished; Caucasian female patient Alert, no distress and comfortable. She is alone.  EYES: no pallor or icterus OROPHARYNX: no thrush or ulceration; poor dentition  NECK: supple, no masses felt LYMPH:  no palpable lymphadenopathy in the cervical, axillary or inguinal regions LUNGS: clear to auscultation and  No wheeze or crackles HEART/CVS: regular rate & rhythm and no murmurs; No lower extremity edema ABDOMEN:abdomen soft, non-tender and normal bowel sounds Musculoskeletal:no cyanosis of digits and no clubbing  PSYCH: alert & oriented x 3 with fluent speech NEURO: no focal motor/sensory deficits SKIN:  no rashes or significant lesions  LABORATORY DATA:  I have reviewed the data as  listed    Component Value Date/Time   NA 138 08/03/2016 1315   NA 140 07/12/2014 0902   K 3.7 08/03/2016 1315   K 3.1 (L) 07/12/2014 0902   CL 108 08/03/2016 1315   CL 104 07/12/2014 0902   CO2 22 08/03/2016 1315   CO2 31 07/12/2014 0902   GLUCOSE 113 (H) 08/03/2016 1315   GLUCOSE 100 (H) 07/12/2014 0902   BUN 13 08/03/2016 1315   BUN 13 07/12/2014 0902   CREATININE 0.96 08/03/2016 1315   CREATININE 0.49 07/12/2014 0902   CALCIUM 9.0 08/03/2016 1315   CALCIUM 9.1 07/12/2014 0902   PROT 5.9 (L) 08/03/2016 1315   PROT 6.8 07/12/2014 0902   ALBUMIN 3.7 08/03/2016 1315   ALBUMIN 3.3 (  L) 07/12/2014 0902   AST 31 08/03/2016 1315   AST 15 07/12/2014 0902   ALT 17 08/03/2016 1315   ALT 11 (L) 07/12/2014 0902   ALKPHOS 44 08/03/2016 1315   ALKPHOS 73 07/12/2014 0902   BILITOT 0.7 08/03/2016 1315   BILITOT 0.5 07/12/2014 0902   GFRNONAA 56 (L) 08/03/2016 1315   GFRNONAA >60 07/12/2014 0902   GFRAA >60 08/03/2016 1315   GFRAA >60 07/12/2014 0902    No results found for: SPEP, UPEP  Lab Results  Component Value Date   WBC 2.7 (L) 08/03/2016   NEUTROABS 1.7 08/03/2016   HGB 10.4 (L) 08/03/2016   HCT 29.3 (L) 08/03/2016   MCV 111.6 (H) 08/03/2016   PLT 209 08/03/2016      Chemistry      Component Value Date/Time   NA 138 08/03/2016 1315   NA 140 07/12/2014 0902   K 3.7 08/03/2016 1315   K 3.1 (L) 07/12/2014 0902   CL 108 08/03/2016 1315   CL 104 07/12/2014 0902   CO2 22 08/03/2016 1315   CO2 31 07/12/2014 0902   BUN 13 08/03/2016 1315   BUN 13 07/12/2014 0902   CREATININE 0.96 08/03/2016 1315   CREATININE 0.49 07/12/2014 0902      Component Value Date/Time   CALCIUM 9.0 08/03/2016 1315   CALCIUM 9.1 07/12/2014 0902   ALKPHOS 44 08/03/2016 1315   ALKPHOS 73 07/12/2014 0902   AST 31 08/03/2016 1315   AST 15 07/12/2014 0902   ALT 17 08/03/2016 1315   ALT 11 (L) 07/12/2014 0902   BILITOT 0.7 08/03/2016 1315   BILITOT 0.5 07/12/2014 0902     IMPRESSION: 1.  No new or acute finding to explain right lower quadrant pain. 2. History of metastatic colon gist with known retroperitoneal adenopathy and pelvic nodule. A left external iliac node has decreased in size compared to November 2017. A retrocaval node has increased in size and decreased enhancement - attention on next restaging scan.   Electronically Signed   By: Monte Fantasia M.D.   On: 06/01/2016 08:53   RADIOGRAPHIC STUDIES: I have personally reviewed the radiological images as listed and agreed with the findings in the report. No results found.   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 CT MARCH 2018- STABLE/improved; retrocrural LN ~ 27m [previously 888m. Monitor for now.   # Clinically no evidence of progression noted- patient tolerating Sutent well- except AEs as discussed below.   # Continue Sutent 25 mg 2 weeks ON; 1 week OFF [sec to AEs]; tolerating well except for mucositis/diarhea [improved]. Labs- okay- except for slightly low Hb 10.5; slightly low WBC 2.7; ANC- 1.7. Continue sutent for now. Last dose of cycle today; will re-start in 1 week.   # Mucositis: Salt-baking soda rinses.    # Diarrhea- G-2- 4-5 loose stools ? Sutent- recommend lomotil.   # follow up in 8 weeks/ labs.    Orders Placed This Encounter  Procedures  . Comprehensive metabolic panel    Standing Status:   Future    Standing Expiration Date:   08/03/2017  . CBC with Differential/Platelet    Standing Status:   Future    Standing Expiration Date:   08/03/2017      GoCammie SickleMD 08/03/2016 4:48 PM

## 2016-09-01 ENCOUNTER — Other Ambulatory Visit: Payer: Self-pay | Admitting: *Deleted

## 2016-09-01 DIAGNOSIS — D500 Iron deficiency anemia secondary to blood loss (chronic): Secondary | ICD-10-CM | POA: Diagnosis not present

## 2016-09-01 DIAGNOSIS — C49A2 Gastrointestinal stromal tumor of stomach: Secondary | ICD-10-CM | POA: Diagnosis not present

## 2016-09-01 DIAGNOSIS — C49A0 Gastrointestinal stromal tumor, unspecified site: Secondary | ICD-10-CM

## 2016-09-01 DIAGNOSIS — M542 Cervicalgia: Secondary | ICD-10-CM | POA: Diagnosis not present

## 2016-09-01 MED ORDER — SUNITINIB MALATE 25 MG PO CAPS: ORAL_CAPSULE | ORAL | 6 refills | Status: DC

## 2016-09-01 NOTE — Telephone Encounter (Signed)
Transmission failed [facility] Parents address this

## 2016-09-01 NOTE — Addendum Note (Signed)
Addended by: Betti Cruz on: 09/01/2016 03:15 PM   Modules accepted: Orders

## 2016-09-14 ENCOUNTER — Telehealth: Payer: Self-pay | Admitting: *Deleted

## 2016-09-14 NOTE — Telephone Encounter (Signed)
Called to request Sutent refill, but this was fill 09/01/16 with 6 refills. Patient advised of this and she was asked to call pharmacy regarding refill request. She stated she will call pharmacy. And thanked me for getting back to her

## 2016-09-17 ENCOUNTER — Other Ambulatory Visit: Payer: Self-pay | Admitting: Internal Medicine

## 2016-10-02 ENCOUNTER — Telehealth: Payer: Self-pay | Admitting: *Deleted

## 2016-10-02 NOTE — Telephone Encounter (Signed)
-----   Message from [place] sent at 10/02/2016 12:39 PM EDT ----- Pt is coming to [place] on Tues and she wanted [name] to know they sent her 28 pills instead of 14 I believe she said it was the Sutent

## 2016-10-02 NOTE — Telephone Encounter (Signed)
Noted. Not sure why mail order pharmacy sent more than quantity prescribed.

## 2016-10-05 ENCOUNTER — Inpatient Hospital Stay: Payer: PPO

## 2016-10-05 ENCOUNTER — Inpatient Hospital Stay: Payer: PPO | Admitting: Internal Medicine

## 2016-10-06 ENCOUNTER — Inpatient Hospital Stay (HOSPITAL_BASED_OUTPATIENT_CLINIC_OR_DEPARTMENT_OTHER): Payer: PPO | Admitting: Internal Medicine

## 2016-10-06 ENCOUNTER — Inpatient Hospital Stay: Payer: PPO | Attending: Internal Medicine

## 2016-10-06 VITALS — BP 159/86 | HR 75 | Resp 16 | Wt 124.8 lb

## 2016-10-06 DIAGNOSIS — C786 Secondary malignant neoplasm of retroperitoneum and peritoneum: Secondary | ICD-10-CM | POA: Insufficient documentation

## 2016-10-06 DIAGNOSIS — Z880 Allergy status to penicillin: Secondary | ICD-10-CM | POA: Diagnosis not present

## 2016-10-06 DIAGNOSIS — C49A4 Gastrointestinal stromal tumor of large intestine: Secondary | ICD-10-CM | POA: Diagnosis not present

## 2016-10-06 DIAGNOSIS — R1031 Right lower quadrant pain: Secondary | ICD-10-CM | POA: Diagnosis not present

## 2016-10-06 DIAGNOSIS — C269 Malignant neoplasm of ill-defined sites within the digestive system: Secondary | ICD-10-CM

## 2016-10-06 DIAGNOSIS — Z79899 Other long term (current) drug therapy: Secondary | ICD-10-CM | POA: Insufficient documentation

## 2016-10-06 DIAGNOSIS — R197 Diarrhea, unspecified: Secondary | ICD-10-CM

## 2016-10-06 DIAGNOSIS — I10 Essential (primary) hypertension: Secondary | ICD-10-CM

## 2016-10-06 DIAGNOSIS — K219 Gastro-esophageal reflux disease without esophagitis: Secondary | ICD-10-CM | POA: Insufficient documentation

## 2016-10-06 DIAGNOSIS — Z803 Family history of malignant neoplasm of breast: Secondary | ICD-10-CM | POA: Insufficient documentation

## 2016-10-06 DIAGNOSIS — M1990 Unspecified osteoarthritis, unspecified site: Secondary | ICD-10-CM | POA: Insufficient documentation

## 2016-10-06 LAB — CBC WITH DIFFERENTIAL/PLATELET
BASOS ABS: 0 10*3/uL (ref 0–0.1)
Basophils Relative: 0 %
Eosinophils Absolute: 0.1 10*3/uL (ref 0–0.7)
Eosinophils Relative: 2 %
HEMATOCRIT: 29.8 % — AB (ref 35.0–47.0)
Hemoglobin: 10.5 g/dL — ABNORMAL LOW (ref 12.0–16.0)
LYMPHS ABS: 0.9 10*3/uL — AB (ref 1.0–3.6)
LYMPHS PCT: 22 %
MCH: 39.1 pg — AB (ref 26.0–34.0)
MCHC: 35.1 g/dL (ref 32.0–36.0)
MCV: 111.3 fL — AB (ref 80.0–100.0)
MONO ABS: 0.3 10*3/uL (ref 0.2–0.9)
Monocytes Relative: 6 %
NEUTROS ABS: 2.9 10*3/uL (ref 1.4–6.5)
Neutrophils Relative %: 70 %
Platelets: 276 10*3/uL (ref 150–440)
RBC: 2.68 MIL/uL — AB (ref 3.80–5.20)
RDW: 14.3 % (ref 11.5–14.5)
WBC: 4.2 10*3/uL (ref 3.6–11.0)

## 2016-10-06 LAB — COMPREHENSIVE METABOLIC PANEL
ALBUMIN: 3.4 g/dL — AB (ref 3.5–5.0)
ALT: 22 U/L (ref 14–54)
ANION GAP: 7 (ref 5–15)
AST: 31 U/L (ref 15–41)
Alkaline Phosphatase: 47 U/L (ref 38–126)
BILIRUBIN TOTAL: 0.5 mg/dL (ref 0.3–1.2)
BUN: 14 mg/dL (ref 6–20)
CHLORIDE: 105 mmol/L (ref 101–111)
CO2: 26 mmol/L (ref 22–32)
Calcium: 8.9 mg/dL (ref 8.9–10.3)
Creatinine, Ser: 1.36 mg/dL — ABNORMAL HIGH (ref 0.44–1.00)
GFR calc Af Amer: 42 mL/min — ABNORMAL LOW (ref >60)
GFR calc non Af Amer: 36 mL/min — ABNORMAL LOW (ref >60)
GLUCOSE: 105 mg/dL — AB (ref 65–99)
POTASSIUM: 4 mmol/L (ref 3.5–5.1)
SODIUM: 138 mmol/L (ref 135–145)
TOTAL PROTEIN: 5.9 g/dL — AB (ref 6.5–8.1)

## 2016-10-06 IMAGING — US US EXTREM LOW VENOUS*L*
1 series · 13 of 24 positions shown · non-contrast
Comparison: None.

CLINICAL DATA: Left lower extremity swelling.  Initial encounter.



[Series 1: us extrem low venous*left* · 0.07mm/px · 13 of 33 slices shown]
[im 1/33]
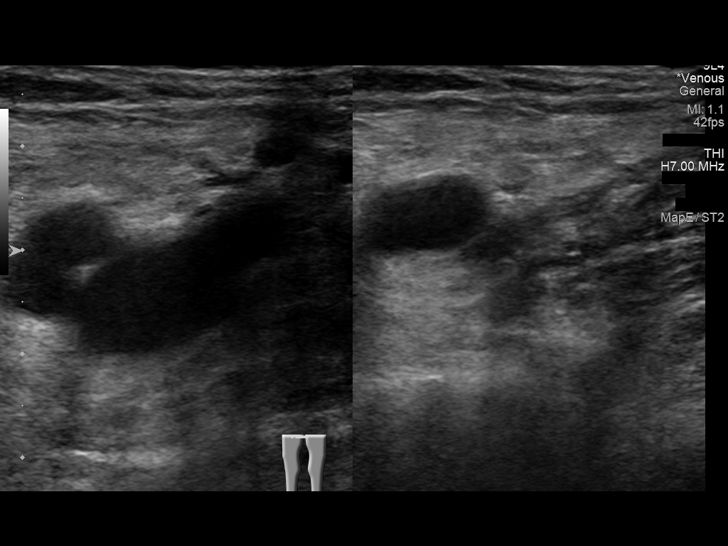
[im 3/33]
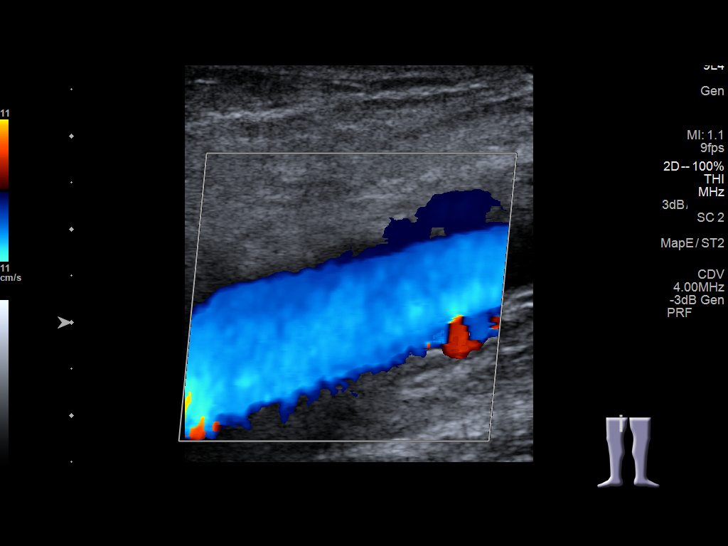
[im 6/33]
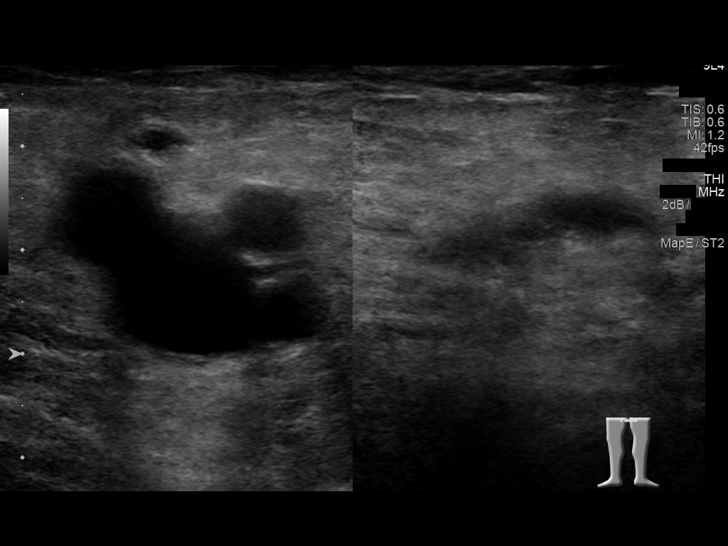
[im 9/33]
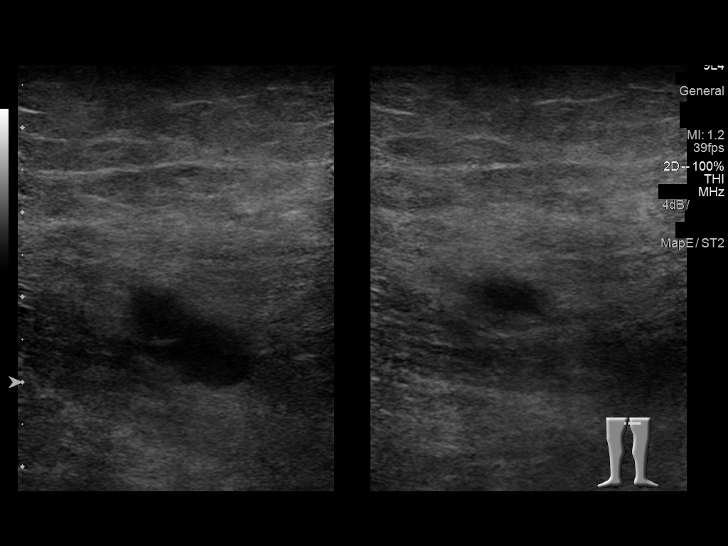
[im 12/33]
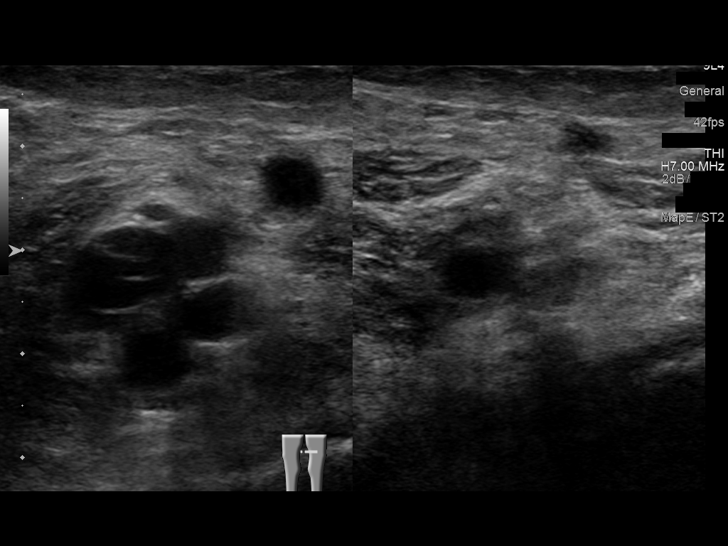
[im 14/33]
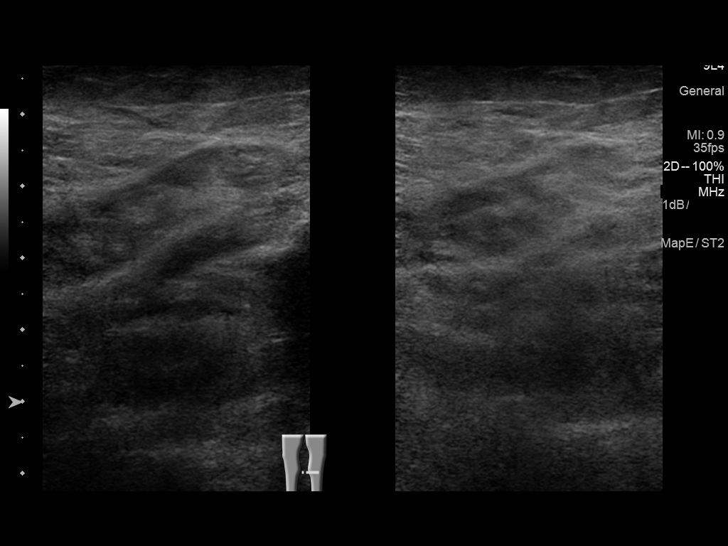
[im 17/33]
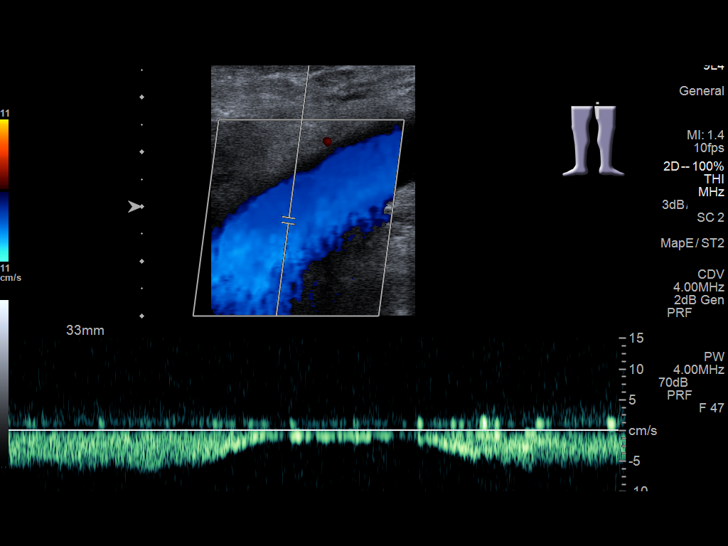
[im 19/33]
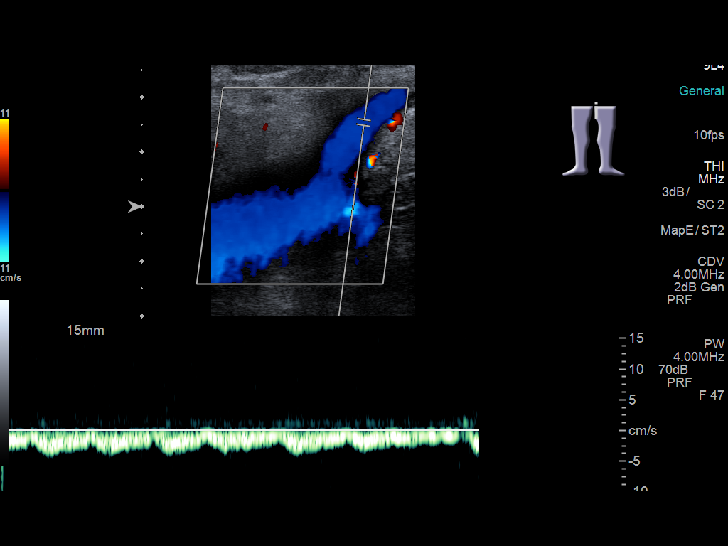
[im 21/33]
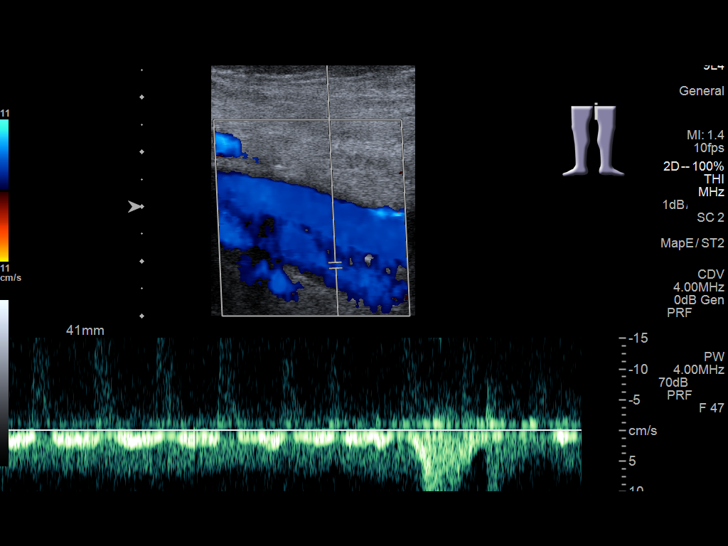
[im 24/33]
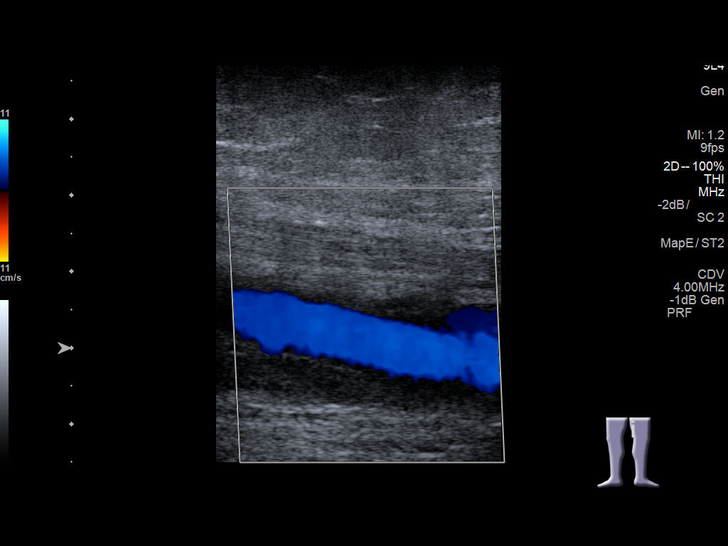
[im 27/33]
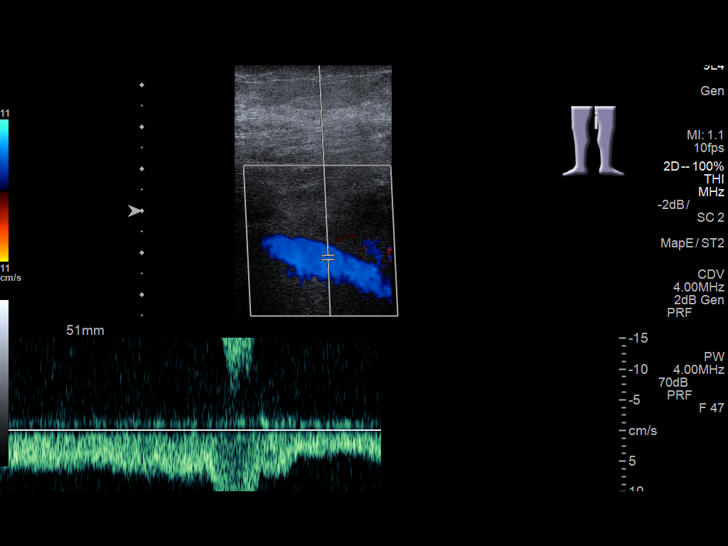
[im 30/33]
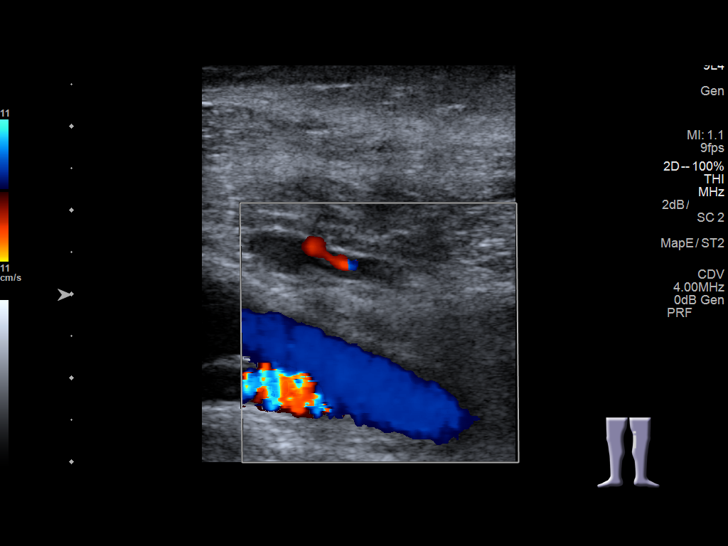
[im 33/33]
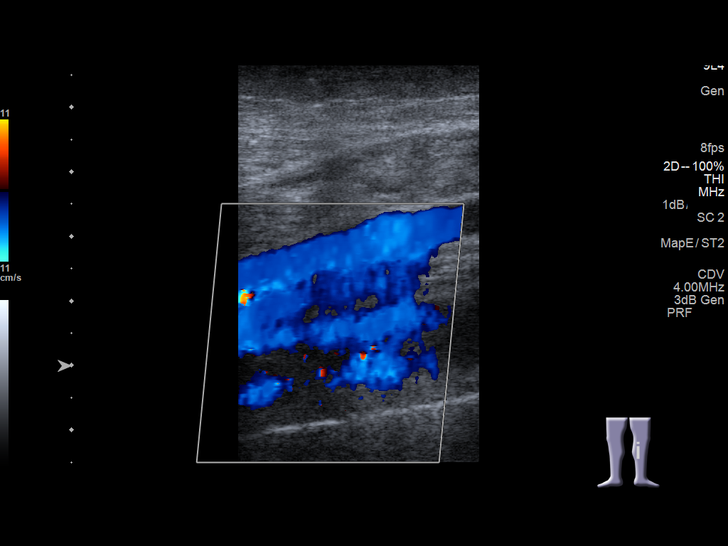

[13 of 24 positions shown; findings below may reference images not displayed]

FINDINGS: Contralateral Common Femoral Vein: Respiratory phasicity is normal
and symmetric with the symptomatic side. No evidence of thrombus.
Normal compressibility.

Common Femoral Vein: No evidence of thrombus. Normal
compressibility, respiratory phasicity and response to augmentation.

Saphenofemoral Junction: No evidence of thrombus. Normal
compressibility and flow on color Doppler imaging.

Profunda Femoral Vein: No evidence of thrombus. Normal
compressibility and flow on color Doppler imaging.

Femoral Vein: No evidence of thrombus. Normal compressibility,
respiratory phasicity and response to augmentation.

Popliteal Vein: No evidence of thrombus. Normal compressibility,
respiratory phasicity and response to augmentation.

Calf Veins: No evidence of thrombus. Normal compressibility and flow
on color Doppler imaging.

Superficial Great Saphenous Vein: No evidence of thrombus. Normal
compressibility and flow on color Doppler imaging.

Venous Reflux:  None.

Other Findings:  None.
IMPRESSION: Negative exam.

## 2016-10-06 NOTE — Patient Instructions (Addendum)
DO NOT START SUTENT UNTIL NEXT VISIT; will check labs; and THEN decide if to start sutent.   # STOP LISINOPRIL until next visit; check Blood Pressure at home; and call us if greater than 150 or bottom number greater than 90  # follow up in 3 weeks/labs.

## 2016-10-06 NOTE — Assessment & Plan Note (Addendum)
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 CT MARCH 2018- STABLE/improved; retrocrural LN ~ 49mm [previously 67mm]. Monitor for now.   # Clinically no evidence of progression noted- patient tolerating Sutent with moderate difficulties as discussed below.   # HOLD Sutent 25 mg 2 weeks ON; 1 week OFF [sec to AEs];  Labs- okay- except for slightly low Hb 10.5; slightly low;  creat- 1.3 [see discussion below].   # acute renal failure- secondary cradle/diarrhea. creat 1.3; increase fluids; HOLD lisinopril also.   # HTN- hold lisnopril; rec bp at home..  # Mucositis: Salt-baking soda rinses.    # Diarrhea- G-2- 06-25-08 loose stools ? Sutent- recommend lomotil/ imodium. Hold Sutent  # follow up in 3 weeks/ labs. We'll plan to start Sutent at next visit

## 2016-10-06 NOTE — Progress Notes (Signed)
Abdomen.  Bilateral upper daily and I will recommend a one year rapidly therapy to the lung [place] OFFICE PROGRESS NOTE  Patient Care Team: Rusty Aus, MD as PCP - General (Internal Medicine)  No matching staging information was found for the patient.   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 Biopsy of the mass showed a CD117 positive spindle cell tumor consistent with a GIST; 02/21/14 showed primary mass in mesentery with a peritoneal nodule in right posterior pelvis and nodule in pelvic cul de sac] ; Surgery April-May 2017 @[facility]   # GIST-June 2016- on Gleevec 322m alternating with 400  # FEB 2017- CT marked Progression of RP/pelvic sidewall LN; new paraesophageal LN; March 2017- Started Sutent 25 mg 3W-On 7 one week OFF [sec to SEs]  # July 27th PET- Significant PR; Sutent 2 w-On & 1 w OFF;  CT 11/27-STABLE DISEASE.   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 Prognostic/predictive value]     [place]   04/04/2014 Initial Diagnosis    Malignant neoplasm [facility] gastrointestinal tract ([facility]        INTERVAL HISTORY:  SKendahl BumgardnerHorner 76y.o.  female pleasant patient above history of gist metastatic- is here for follow-up patient is currently on sunitinib since March 2017; Is also here For follow-up.  Patient complains of significant diarrhea up to 10 loose stools a day; especially weeks of taking Sutent. She had been taking Imodium/Lomotil.  No nausea no vomiting. No headaches. Denies any rash palms and soles. No chest pain or shortness of breath or cough. Patient continues to be on Sutent 2 weeks on one week off. This take his off week.  REVIEW OF SYSTEMS:  A complete  10 point review of system is done which is negative except mentioned above/history of present illness.   PAST MEDICAL HISTORY :  Past Medical History:  Diagnosis Date  . Arthritis   . Cancer ([facility]    GIST  . GERD (gastroesophageal reflux disease)   . GIST (gastrointestinal stroma tumor), malignant, colon ([place]   . History of hiatal hernia   . Hypertension    NO MEDS NOW  . Tremors of nervous system    HEAD    PAST SURGICAL HISTORY :   Past Surgical History:  Procedure Laterality Date  . ABDOMINAL HYSTERECTOMY    . BREAST BIOPSY Right   . CATARACT EXTRACTION W/PHACO Right 01/28/2015   Procedure: CATARACT EXTRACTION PHACO AND INTRAOCULAR LENS PLACEMENT (IOC);  Surgeon: SEstill Cotta MD;  Location: [facility] ORS;  Service: Ophthalmology;  Laterality: Right;  [place]00:59.3 AP% 22.6 CDE 24.69 fluid pack lot [id number]  . CATARACT EXTRACTION W/PHACO Left 02/11/2015   Procedure: CATARACT EXTRACTION PHACO AND INTRAOCULAR LENS PLACEMENT (IOC);  Surgeon: SEstill Cotta MD;  Location: [facility] ORS;  Service: Ophthalmology;  Laterality: Left;  [place]01:18 AP% 22.8 CDE 32.38  fluid pack lot # [id number]  . COLON SURGERY     COLOSTOMY AND REVERSAL  . STOMACH SURGERY     CANCER    FAMILY HISTORY :   Family History  Problem Relation Age of Onset  . Breast cancer Maternal Grandmother     SOCIAL HISTORY:   Social History  Substance Use Topics  . Smoking status: Never Smoker  . Smokeless tobacco: Not on file  . Alcohol use No    ALLERGIES:  is allergic to other and penicillins.  MEDICATIONS:  Current Outpatient Prescriptions  Medication Sig Dispense Refill  . Cetirizine HCl (ZYRTEC PO) Take by mouth.    . Cholecalciferol (VITAMIN D3 ADULT GUMMIES) 1000 units CHEW Chew 1 each by mouth daily.    . Cyanocobalamin (RA VITAMIN B-12 TR) 1000 MCG TBCR Take by mouth.    . diphenoxylate-atropine (LOMOTIL) 2.5-0.025 MG tablet TAKE 1 TABLET BY MOUTH EVERY 8 HOURS AS NEEDED FOR DIARRHEA 60 tablet  0  . Iron-Vitamin C (VITRON-C) 65-125 MG TABS Take by mouth.    Marland Kitchen lisinopril (PRINIVIL) 10 MG tablet Take 1 tablet (10 mg total) by mouth daily. 30 tablet 3  . Loperamide HCl (IMODIUM A-D PO) Take 1 tablet by mouth as needed.    . megestrol (MEGACE) 40 MG tablet daily  2  . mometasone (NASONEX) 50 MCG/ACT nasal spray 50 sprays.    . Multiple Vitamin (MULTIVITAMIN) tablet Take 1 tablet by mouth daily.    . potassium chloride SA (K-DUR,KLOR-CON) 20 MEQ tablet Take 1 tablet (20 mEq total) by mouth daily. 30 tablet 0  . ranitidine (ZANTAC) 150 MG tablet Take 150 mg by mouth 2 (two) times daily.     . SUNItinib (SUTENT) 25 MG capsule Take 1 capsule by mouth daily for 14 days then 7 days off. 14 capsule 6   No current facility-administered medications for this visit.     PHYSICAL EXAMINATION: ECOG PERFORMANCE STATUS: 0 - Asymptomatic  BP (!) 159/86 (BP Location: Right Arm)   Pulse 75   Resp 16   Wt 124 lb 12.5 oz (56.6 kg)   BMI 21.42 kg/m   Filed Weights   10/06/16 1345  Weight: 124 lb 12.5 oz (56.6 kg)    GENERAL: Thin built moderately nourished; Caucasian female patient Alert, no distress and comfortable. She is alone.  EYES: no pallor or icterus OROPHARYNX: no thrush or ulceration; poor dentition  NECK: supple, no masses felt LYMPH:  no palpable lymphadenopathy in the cervical, axillary or inguinal regions LUNGS: clear to auscultation and  No wheeze or crackles HEART/CVS: regular rate & rhythm and no murmurs; No lower extremity edema ABDOMEN:abdomen soft, non-tender and normal bowel sounds Musculoskeletal:no cyanosis of digits and no clubbing  PSYCH: alert & oriented x 3 with fluent speech NEURO: no focal motor/sensory deficits SKIN:  no rashes or significant lesions  LABORATORY DATA:  I have reviewed the data as listed    Component Value Date/Time   NA 138 10/06/2016 1316   NA 140 07/12/2014 0902   K 4.0 10/06/2016 1316   K 3.1 (L) 07/12/2014 0902   CL 105 10/06/2016  1316   CL 104 07/12/2014 0902   CO2 26 10/06/2016 1316   CO2 31 07/12/2014 0902   GLUCOSE 105 (H) 10/06/2016 1316   GLUCOSE 100 (H) 07/12/2014 0902   BUN 14 10/06/2016 1316   BUN 13 07/12/2014 0902   CREATININE 1.36 (H) 10/06/2016 1316   CREATININE 0.49 07/12/2014 0902   CALCIUM 8.9 10/06/2016 1316   CALCIUM 9.1 07/12/2014 0902   PROT 5.9 (L) 10/06/2016 1316   PROT 6.8 07/12/2014 0902   ALBUMIN 3.4 (L) 10/06/2016 1316   ALBUMIN 3.3 (L) 07/12/2014 0902   AST 31 10/06/2016 1316   AST 15 07/12/2014 0902   ALT 22 10/06/2016 1316   ALT 11 (L)  07/12/2014 0902   ALKPHOS 47 10/06/2016 1316   ALKPHOS 73 07/12/2014 0902   BILITOT 0.5 10/06/2016 1316   BILITOT 0.5 07/12/2014 0902   GFRNONAA 36 (L) 10/06/2016 1316   GFRNONAA >60 07/12/2014 0902   GFRAA 42 (L) 10/06/2016 1316   GFRAA >60 07/12/2014 0902    No results found for: SPEP, UPEP  Lab Results  Component Value Date   WBC 4.2 10/06/2016   NEUTROABS 2.9 10/06/2016   HGB 10.5 (L) 10/06/2016   HCT 29.8 (L) 10/06/2016   MCV 111.3 (H) 10/06/2016   PLT 276 10/06/2016      Chemistry      Component Value Date/Time   NA 138 10/06/2016 1316   NA 140 07/12/2014 0902   K 4.0 10/06/2016 1316   K 3.1 (L) 07/12/2014 0902   CL 105 10/06/2016 1316   CL 104 07/12/2014 0902   CO2 26 10/06/2016 1316   CO2 31 07/12/2014 0902   BUN 14 10/06/2016 1316   BUN 13 07/12/2014 0902   CREATININE 1.36 (H) 10/06/2016 1316   CREATININE 0.49 07/12/2014 0902      Component Value Date/Time   CALCIUM 8.9 10/06/2016 1316   CALCIUM 9.1 07/12/2014 0902   ALKPHOS 47 10/06/2016 1316   ALKPHOS 73 07/12/2014 0902   AST 31 10/06/2016 1316   AST 15 07/12/2014 0902   ALT 22 10/06/2016 1316   ALT 11 (L) 07/12/2014 0902   BILITOT 0.5 10/06/2016 1316   BILITOT 0.5 07/12/2014 0902     IMPRESSION: 1. No new or acute finding to explain right lower quadrant pain. 2. History of metastatic colon gist with known retroperitoneal adenopathy and pelvic  nodule. A left external iliac node has decreased in size compared to November 2017. A retrocaval node has increased in size and decreased enhancement - attention on next restaging scan.   Electronically Signed   By: Monte Fantasia M.D.   On: 06/01/2016 08:53   RADIOGRAPHIC STUDIES: I have personally reviewed the radiological images as listed and agreed with the findings in the report. No results found.   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 CT MARCH 2018- STABLE/improved; retrocrural LN ~ 39m [previously 849m. Monitor for now.   # Clinically no evidence of progression noted- patient tolerating Sutent with moderate difficulties as discussed below.   # HOLD Sutent 25 mg 2 weeks ON; 1 week OFF [sec to AEs];  Labs- okay- except for slightly low Hb 10.5; slightly low;  creat- 1.3 [see discussion below].   # acute renal failure- secondary cradle/diarrhea. creat 1.3; increase fluids; HOLD lisinopril also.   # HTN- hold lisnopril; rec bp at home..  # Mucositis: Salt-baking soda rinses.    # Diarrhea- G-2- 06-25-08 loose stools ? Sutent- recommend lomotil/ imodium. Hold Sutent  # follow up in 3 weeks/ labs. We'll plan to start Sutent at next visit   Orders Placed This Encounter  Procedures  . CBC with Differential/Platelet    Standing Status:   Future    Standing Expiration Date:   10/06/2017  . Basic metabolic panel    Standing Status:   Future    Standing Expiration Date:   10/06/2017      GoCammie SickleMD 10/06/2016 4:45 PM

## 2016-10-20 DIAGNOSIS — R350 Frequency of micturition: Secondary | ICD-10-CM | POA: Diagnosis not present

## 2016-10-22 DIAGNOSIS — H353131 Nonexudative age-related macular degeneration, bilateral, early dry stage: Secondary | ICD-10-CM | POA: Diagnosis not present

## 2016-10-26 DIAGNOSIS — R102 Pelvic and perineal pain: Secondary | ICD-10-CM | POA: Diagnosis not present

## 2016-10-26 NOTE — Assessment & Plan Note (Addendum)
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 CT MARCH 2018- STABLE/improved; retrocrural LN ~ 36mm [previously 107mm].  # Continue to hold Sutent- because of acute renal failure [C discussion below]. No CLINICAL progression noted.   # Acute renal failure- worsened- creat 1.8 [HOLDING Lisinopril] increase fluids;  ? UTI s/p cipro. Check urine analysis; also protein creatinine ratio; check FeNa. Recommend continued abstinence from NSAIDs. Check bilateral kidney ultrasound.  # Anemia hemoglobin 8.5- secondary to acute renal failure. Monitor closely.  # Diarrhea- G-3-diarrhea. Currently Resolved. Will hold off sutent given acute renal failure [see discussion above].  # Back pain/flank pain- await above workup.  #  Follow-up in 1 week CBC/bmp.   Addendum: Ultrasound kidneys- bilateral hydronephrosis; question mass pushing the posterior wall of the bladder. We will refer the patient to urology. Also get a PET scan ASAP.

## 2016-10-26 NOTE — Progress Notes (Signed)
OFFICE PROGRESS NOTE  Patient Care Team: Rusty Aus, MD as PCP - General (Internal Medicine)  No matching staging information was found for the patient.   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 Biopsy of the mass showed a CD117 positive spindle cell tumor consistent with a GIST; 02/21/14 showed primary mass in mesentery with a peritoneal nodule in right posterior pelvis and nodule in pelvic cul de sac] ; Surgery April-May 2017 _0   # GIST-June 2016- on Gleevec 330m alternating with 400  # FEB 2017- CT marked Progression of RP/pelvic sidewall LN; new paraesophageal LN; March 2017- Started Sutent 25 mg 3W-On 7 one week OFF [sec to SEs]  # July 27th PET- Significant PR; Sutent 2 w-On & 1 w OFF;  CT 11/27-STABLE DISEASE.   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 Prognostic/predictive value]     Malignant neoplasm of gastrointestinal tract ([place]   04/04/2014 Initial Diagnosis    Malignant [facility] gastrointestinal tract ([facility]        INTERVAL HISTORY:  SDevota ViruetHorner 76y.o.  female pleasant patient above history of gist metastatic- is here for follow-up patient is currently on sunitinib since March 2017; Is also here For follow-up.Sutent was held at last visit approximately 3 weeks ago because of severe diarrhea.   Patient states that in the interim she was treated by her PCP for UTI. Question ciprofloxacin.  Patient currently denies any dysuria. Denies any fevers. However she does complain of back pain/flank pain. No chills.  Patient's diarrhea has resolved. Her appetite is improving. She states to be drinking enough water. She denies any NSAIDs.  No nausea no vomiting. No headaches. She continues to be active  physically.  REVIEW OF SYSTEMS:  A complete 10 point review of system is done which is negative except mentioned above/history of present illness.   PAST MEDICAL HISTORY :  Past Medical History:  Diagnosis Date  . Arthritis   . Cancer ([facility]    GIST  . GERD (gastroesophageal reflux disease)   . GIST (gastrointestinal stroma tumor), malignant, colon ([place]   . History of hiatal hernia   . Hypertension    NO MEDS NOW  . Tremors of nervous system    HEAD    PAST SURGICAL HISTORY :   Past Surgical History:  Procedure Laterality Date  . ABDOMINAL HYSTERECTOMY    . BREAST BIOPSY Right   . CATARACT EXTRACTION W/PHACO Right 01/28/2015   Procedure: CATARACT EXTRACTION PHACO AND INTRAOCULAR LENS PLACEMENT (IOC);  Surgeon: SEstill Cotta MD;  Location: [facility] ORS;  Service: Ophthalmology;  Laterality: Right;  [place]00:59.3 AP% 22.6 CDE 24.69 fluid pack lot ##[id number]  . CATARACT EXTRACTION W/PHACO Left 02/11/2015   Procedure: CATARACT EXTRACTION PHACO AND INTRAOCULAR LENS PLACEMENT (IOC);  Surgeon: SEstill Cotta MD;  Location: [facility] ORS;  Service: Ophthalmology;  Laterality: Left;  [place]01:18 AP% 22.8 CDE 32.38  fluid pack lot # [id number]  . COLON SURGERY     COLOSTOMY AND REVERSAL  . STOMACH SURGERY     CANCER    FAMILY HISTORY :   Family History  Problem Relation Age of Onset  . Breast cancer Maternal Grandmother     SOCIAL HISTORY:   Social History  Substance Use Topics  . Smoking status: Never Smoker  . Smokeless tobacco: Not on file  . Alcohol use No    ALLERGIES:  is allergic to ciprofloxacin; other; and penicillins.  MEDICATIONS:  Current Outpatient Prescriptions  Medication Sig Dispense Refill  . aspirin-sod bicarb-citric acid (ALKA-SELTZER) 325 MG TBEF tablet Take 325 mg by mouth every 6 (six) hours as needed (indigestion).    . Cetirizine HCl (ZYRTEC PO) Take 1 tablet by mouth daily.     . Cholecalciferol (VITAMIN D3 ADULT GUMMIES) 1000 units CHEW Chew 1 each  by mouth daily.    . Cyanocobalamin (RA VITAMIN B-12 TR) 1000 MCG TBCR Take 2 tablets by mouth daily.     . Iron-Vitamin C (VITRON-C) 65-125 MG TABS Take 2 tablets by mouth daily.     . megestrol (MEGACE) 40 MG tablet daily three days a week  2  . potassium chloride SA (K-DUR,KLOR-CON) 20 MEQ tablet Take 1 tablet (20 mEq total) by mouth daily. 30 tablet 0  . ranitidine (ZANTAC) 150 MG tablet Take 150 mg by mouth 2 (two) times daily.     . diphenoxylate-atropine (LOMOTIL) 2.5-0.025 MG tablet TAKE 1 TABLET BY MOUTH EVERY 8 HOURS AS NEEDED FOR DIARRHEA (Patient not taking: Reported on 10/27/2016) 60 tablet 0  . lisinopril (PRINIVIL) 10 MG tablet Take 1 tablet (10 mg total) by mouth daily. (Patient not taking: Reported on 10/27/2016) 30 tablet 3  . Loperamide HCl (IMODIUM A-D PO) Take 1 tablet by mouth as needed.    . mometasone (NASONEX) 50 MCG/ACT nasal spray Place 2 sprays into the nose daily as needed (sinus drainage).     . Multiple Vitamin (MULTIVITAMIN) tablet Take 1 tablet by mouth daily.    . SUNItinib (SUTENT) 25 MG capsule Take 1 capsule by mouth daily for 14 days then 7 days off. (Patient not taking: Reported on 10/27/2016) 14 capsule 6   No current facility-administered medications for this visit.     PHYSICAL EXAMINATION: ECOG PERFORMANCE STATUS: 0 - Asymptomatic  BP (!) 157/84   Pulse 85   Temp 97.6 F (36.4 C) (Tympanic)   Resp 18   There were no vitals filed for this visit.  GENERAL: Thin built moderately nourished; Caucasian female patient Alert, no distress and comfortable. She is alone.  EYES: no pallor or icterus OROPHARYNX: no thrush or ulceration; poor dentition  NECK: supple, no masses felt LYMPH:  no palpable lymphadenopathy in the cervical, axillary or inguinal regions LUNGS: clear to auscultation and  No wheeze or crackles HEART/CVS: regular rate & rhythm and no murmurs; No lower extremity edema ABDOMEN:abdomen soft, non-tender and normal bowel  sounds Musculoskeletal:no cyanosis of digits and no clubbing  PSYCH: alert & oriented x 3 with fluent speech NEURO: no focal motor/sensory deficits SKIN:  no rashes or significant lesions  LABORATORY DATA:  I have reviewed the data as listed    Component Value Date/Time   NA 141 10/27/2016 0753   NA 140 07/12/2014 0902   K 3.8 10/27/2016 0753   K 3.1 (L) 07/12/2014 0902   CL 109 10/27/2016 0753   CL 104 07/12/2014 0902   CO2 23 10/27/2016 0753   CO2 31 07/12/2014 0902   GLUCOSE 98 10/27/2016 0753   GLUCOSE 100 (H) 07/12/2014 0902   BUN 23 (H) 10/27/2016 0753   BUN 13 07/12/2014 0902   CREATININE 1.84 (H) 10/27/2016 0753   CREATININE 0.49 07/12/2014 0902   CALCIUM 9.6 10/27/2016 0753   CALCIUM 9.1 07/12/2014  0902   PROT 5.9 (L) 10/06/2016 1316   PROT 6.8 07/12/2014 0902   ALBUMIN 3.4 (L) 10/06/2016 1316   ALBUMIN 3.3 (L) 07/12/2014 0902   AST 31 10/06/2016 1316   AST 15 07/12/2014 0902   ALT 22 10/06/2016 1316   ALT 11 (L) 07/12/2014 0902   ALKPHOS 47 10/06/2016 1316   ALKPHOS 73 07/12/2014 0902   BILITOT 0.5 10/06/2016 1316   BILITOT 0.5 07/12/2014 0902   GFRNONAA 25 (L) 10/27/2016 0753   GFRNONAA >60 07/12/2014 0902   GFRAA 29 (L) 10/27/2016 0753   GFRAA >60 07/12/2014 0902    No results found for: SPEP, UPEP  Lab Results  Component Value Date   WBC 4.6 10/27/2016   NEUTROABS 3.2 10/27/2016   HGB 8.0 (L) 10/27/2016   HCT 23.3 (L) 10/27/2016   MCV 109.8 (H) 10/27/2016   PLT 355 10/27/2016      Chemistry      Component Value Date/Time   NA 141 10/27/2016 0753   NA 140 07/12/2014 0902   K 3.8 10/27/2016 0753   K 3.1 (L) 07/12/2014 0902   CL 109 10/27/2016 0753   CL 104 07/12/2014 0902   CO2 23 10/27/2016 0753   CO2 31 07/12/2014 0902   BUN 23 (H) 10/27/2016 0753   BUN 13 07/12/2014 0902   CREATININE 1.84 (H) 10/27/2016 0753   CREATININE 0.49 07/12/2014 0902      Component Value Date/Time   CALCIUM 9.6 10/27/2016 0753   CALCIUM 9.1 07/12/2014  0902   ALKPHOS 47 10/06/2016 1316   ALKPHOS 73 07/12/2014 0902   AST 31 10/06/2016 1316   AST 15 07/12/2014 0902   ALT 22 10/06/2016 1316   ALT 11 (L) 07/12/2014 0902   BILITOT 0.5 10/06/2016 1316   BILITOT 0.5 07/12/2014 0902     IMPRESSION: 1. No new or acute finding to explain right lower quadrant pain. 2. History of metastatic colon gist with known retroperitoneal adenopathy and pelvic nodule. A left external iliac node has decreased in size compared to November 2017. A retrocaval node has increased in size and decreased enhancement - attention on next restaging scan.   Electronically Signed   By: Monte Fantasia M.D.   On: 06/01/2016 08:53   RADIOGRAPHIC STUDIES: I have personally reviewed the radiological images as listed and agreed with the findings in the report. No results found.   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 CT MARCH 2018- STABLE/improved; retrocrural LN ~ 66m [previously 815m.  # Continue to hold Sutent- because of acute renal failure [C discussion below]. No CLINICAL progression noted.   # Acute renal failure- worsened- creat 1.8 [HOLDING Lisinopril] increase fluids;  ? UTI s/p cipro. Check urine analysis; also protein creatinine ratio; check FeNa. Recommend continued abstinence from NSAIDs. Check bilateral kidney ultrasound.  # Anemia hemoglobin 8.5- secondary to acute renal failure. Monitor closely.  # Diarrhea- G-3-diarrhea. Currently Resolved. Will hold off sutent given acute renal failure [see discussion above].  # Back pain/flank pain- await above workup.  #  Follow-up in 1 week CBC/bmp.   Cc; Dr.Miller.    Orders Placed This Encounter  Procedures  . [place]    Standing Status:   Future    Number of Occurrences:   1    Standing Expiration Date:   12/27/2017    Order Specific Question:  Reason  for Exam (SYMPTOM  OR DIAGNOSIS REQUIRED)    Answer:   acute renal failure    Order Specific Question:   Preferred imaging location?    Answer:   [facility] [place]  . Protein / creatinine ratio, urine    Standing Status:   Future    Number of Occurrences:   1    Standing Expiration Date:   10/27/2017  . Creatinine, urine, random    Standing Status:   Future    Number of Occurrences:   1    Standing Expiration Date:   10/27/2017  . Sodium, urine, random    Standing Status:   Future    Number of Occurrences:   1    Standing Expiration Date:   10/27/2017  . Urinalysis, Complete w Microscopic    Standing Status:   Future    Number of Occurrences:   1    Standing Expiration Date:   10/27/2017  . [name], RETROPERITNL ABD,  LTD    Standing Status:   Future    Standing Expiration Date:   10/27/2017    Scheduling Instructions:     Schedule asap -      Malignant neoplasm of gastrointestinal tract ([place]) - Primary      ARF (acute renal failure) with tubular necrosis      Cammie Sickle, MD 10/27/2016 2:05 PM

## 2016-10-27 ENCOUNTER — Ambulatory Visit
Admission: RE | Admit: 2016-10-27 | Discharge: 2016-10-27 | Disposition: A | Discharge status: Home / Self Care | Payer: PPO | Source: Ambulatory Visit | Attending: Internal Medicine | Admitting: Internal Medicine

## 2016-10-27 ENCOUNTER — Telehealth: Payer: Self-pay | Admitting: *Deleted

## 2016-10-27 ENCOUNTER — Inpatient Hospital Stay: Payer: PPO | Attending: Internal Medicine | Admitting: Internal Medicine

## 2016-10-27 ENCOUNTER — Ambulatory Visit: Payer: PPO | Admitting: Internal Medicine

## 2016-10-27 ENCOUNTER — Inpatient Hospital Stay: Payer: PPO

## 2016-10-27 ENCOUNTER — Other Ambulatory Visit: Payer: Self-pay | Admitting: *Deleted

## 2016-10-27 VITALS — BP 157/84 | HR 85 | Temp 97.6°F | Resp 18

## 2016-10-27 DIAGNOSIS — C269 Malignant neoplasm of ill-defined sites within the digestive system: Secondary | ICD-10-CM

## 2016-10-27 DIAGNOSIS — Z87440 Personal history of urinary (tract) infections: Secondary | ICD-10-CM | POA: Diagnosis not present

## 2016-10-27 DIAGNOSIS — C49A4 Gastrointestinal stromal tumor of large intestine: Secondary | ICD-10-CM | POA: Insufficient documentation

## 2016-10-27 DIAGNOSIS — Z880 Allergy status to penicillin: Secondary | ICD-10-CM

## 2016-10-27 DIAGNOSIS — N3289 Other specified disorders of bladder: Secondary | ICD-10-CM | POA: Diagnosis not present

## 2016-10-27 DIAGNOSIS — N170 Acute kidney failure with tubular necrosis: Secondary | ICD-10-CM

## 2016-10-27 DIAGNOSIS — N179 Acute kidney failure, unspecified: Secondary | ICD-10-CM | POA: Diagnosis not present

## 2016-10-27 DIAGNOSIS — Z79899 Other long term (current) drug therapy: Secondary | ICD-10-CM | POA: Diagnosis not present

## 2016-10-27 DIAGNOSIS — C786 Secondary malignant neoplasm of retroperitoneum and peritoneum: Secondary | ICD-10-CM | POA: Insufficient documentation

## 2016-10-27 DIAGNOSIS — I10 Essential (primary) hypertension: Secondary | ICD-10-CM | POA: Insufficient documentation

## 2016-10-27 DIAGNOSIS — Z803 Family history of malignant neoplasm of breast: Secondary | ICD-10-CM | POA: Insufficient documentation

## 2016-10-27 DIAGNOSIS — Z881 Allergy status to other antibiotic agents status: Secondary | ICD-10-CM | POA: Insufficient documentation

## 2016-10-27 DIAGNOSIS — K219 Gastro-esophageal reflux disease without esophagitis: Secondary | ICD-10-CM | POA: Diagnosis not present

## 2016-10-27 DIAGNOSIS — Z7982 Long term (current) use of aspirin: Secondary | ICD-10-CM

## 2016-10-27 DIAGNOSIS — N1330 Unspecified hydronephrosis: Secondary | ICD-10-CM | POA: Diagnosis not present

## 2016-10-27 LAB — URINALYSIS, COMPLETE (UACMP) WITH MICROSCOPIC
Bilirubin Urine: NEGATIVE
Glucose, UA: NEGATIVE mg/dL
Ketones, ur: NEGATIVE mg/dL
Nitrite: NEGATIVE
PROTEIN: 30 mg/dL — AB
SPECIFIC GRAVITY, URINE: 1.015 (ref 1.005–1.030)
SQUAMOUS EPITHELIAL / LPF: NONE SEEN
pH: 5.5 (ref 5.0–8.0)

## 2016-10-27 LAB — BASIC METABOLIC PANEL
ANION GAP: 9 (ref 5–15)
BUN: 23 mg/dL — ABNORMAL HIGH (ref 6–20)
CALCIUM: 9.6 mg/dL (ref 8.9–10.3)
CO2: 23 mmol/L (ref 22–32)
Chloride: 109 mmol/L (ref 101–111)
Creatinine, Ser: 1.84 mg/dL — ABNORMAL HIGH (ref 0.44–1.00)
GFR, EST AFRICAN AMERICAN: 29 mL/min — AB (ref >60)
GFR, EST NON AFRICAN AMERICAN: 25 mL/min — AB (ref >60)
GLUCOSE: 98 mg/dL (ref 65–99)
Potassium: 3.8 mmol/L (ref 3.5–5.1)
Sodium: 141 mmol/L (ref 135–145)

## 2016-10-27 LAB — SODIUM, URINE, RANDOM: Sodium, Ur: 42 mmol/L

## 2016-10-27 LAB — CBC WITH DIFFERENTIAL/PLATELET
BASOS ABS: 0 10*3/uL (ref 0–0.1)
Basophils Relative: 1 %
Eosinophils Absolute: 0.2 10*3/uL (ref 0–0.7)
Eosinophils Relative: 4 %
HEMATOCRIT: 23.3 % — AB (ref 35.0–47.0)
HEMOGLOBIN: 8 g/dL — AB (ref 12.0–16.0)
LYMPHS ABS: 0.7 10*3/uL — AB (ref 1.0–3.6)
Lymphocytes Relative: 16 %
MCH: 37.9 pg — ABNORMAL HIGH (ref 26.0–34.0)
MCHC: 34.5 g/dL (ref 32.0–36.0)
MCV: 109.8 fL — AB (ref 80.0–100.0)
Monocytes Absolute: 0.5 10*3/uL (ref 0.2–0.9)
Monocytes Relative: 12 %
NEUTROS ABS: 3.2 10*3/uL (ref 1.4–6.5)
NEUTROS PCT: 67 %
PLATELETS: 355 10*3/uL (ref 150–440)
RBC: 2.12 MIL/uL — AB (ref 3.80–5.20)
RDW: 13.2 % (ref 11.5–14.5)
WBC: 4.6 10*3/uL (ref 3.6–11.0)

## 2016-10-27 LAB — PROTEIN / CREATININE RATIO, URINE
CREATININE, URINE: 122 mg/dL
PROTEIN CREATININE RATIO: 0.33 mg/mg{creat} — AB (ref 0.00–0.15)
TOTAL PROTEIN, URINE: 40 mg/dL

## 2016-10-27 LAB — CREATININE, URINE, RANDOM: Creatinine, Urine: 121 mg/dL

## 2016-10-27 NOTE — Telephone Encounter (Signed)
Pt returned my phone call at 515 pm. Explained the need for the urology referral. She gave verbal understanding of the plan of care and location of Dr. Cherrie Gauze office. She states that she is already familiar with Dr. Erlene Quan as her husband is under Dr. Cherrie Gauze care. She states that this is "reassuring as I know that I will be in good hands." She thanked me for calling her with the results.

## 2016-10-27 NOTE — Telephone Encounter (Signed)
-----   Message from Secundino Ginger sent at 10/27/2016  3:17 PM EDT ----- Regarding: medication Courtney [name] left a msg on my VM that the medication she could not remember that she took last week is Ciprofloxacin. 250 mg by mouth twice a day.

## 2016-10-27 NOTE — Telephone Encounter (Signed)
Acute onset of renal failure. H/o GIST tumor. Pt has held sutent due to renal function. Recent h/o uti-treated with cipro. Pt reports that her urine was foul smelling at that time and dark.  md ordered renal u/s to further work up pt.     Incoming call at 325 pm today from [facility] location-u/s dept. US renal demonstrates a bladder mass that is contributing to hydronephrosis.  I personally called this report to Dr. Rogue Bussing. V/o to set up urgent referral to urology-Dr.Brandon or her associates to get patient in asap.   Apt obtained for 345pm tom with Dr. Erlene Quan. I attempted to reach the patient via cell phone and home phone. I was unable to leave vm on cell as vm box is not set up. I left vm on pt's home phone asking pt to call me back asap.

## 2016-10-28 ENCOUNTER — Other Ambulatory Visit: Payer: Self-pay | Admitting: Internal Medicine

## 2016-10-28 ENCOUNTER — Encounter: Payer: Self-pay | Admitting: Urology

## 2016-10-28 ENCOUNTER — Ambulatory Visit (INDEPENDENT_AMBULATORY_CARE_PROVIDER_SITE_OTHER): Payer: PPO | Admitting: Urology

## 2016-10-28 VITALS — BP 146/70 | HR 106 | Ht 64.0 in | Wt 128.9 lb

## 2016-10-28 DIAGNOSIS — C269 Malignant neoplasm of ill-defined sites within the digestive system: Secondary | ICD-10-CM

## 2016-10-28 DIAGNOSIS — C679 Malignant neoplasm of bladder, unspecified: Secondary | ICD-10-CM | POA: Diagnosis not present

## 2016-10-28 DIAGNOSIS — N179 Acute kidney failure, unspecified: Secondary | ICD-10-CM

## 2016-10-28 DIAGNOSIS — N1330 Unspecified hydronephrosis: Secondary | ICD-10-CM | POA: Diagnosis not present

## 2016-10-28 LAB — MICROSCOPIC EXAMINATION
EPITHELIAL CELLS (NON RENAL): NONE SEEN /HPF (ref 0–10)
RBC MICROSCOPIC, UA: NONE SEEN /HPF (ref 0–?)

## 2016-10-28 LAB — URINALYSIS, COMPLETE
Bilirubin, UA: NEGATIVE
Glucose, UA: NEGATIVE
KETONES UA: NEGATIVE
NITRITE UA: NEGATIVE
Specific Gravity, UA: 1.01 (ref 1.005–1.030)
Urobilinogen, Ur: 0.2 mg/dL (ref 0.2–1.0)
pH, UA: 5.5 (ref 5.0–7.5)

## 2016-10-28 NOTE — Progress Notes (Signed)
Please inform patient that I ordered a PET scan for further evaluation of her disease. Please set up the PET scan ASAP

## 2016-10-28 NOTE — Progress Notes (Signed)
10/28/2016 2:51 PM   Courtney [name] 29-Jan-1941 [id number]  Referring provider: Rusty Aus, MD [facility] [place], [place] [address]  Chief Complaint  Patient presents with  . Hydronephrosis    HPI: [name] with history of progressive metastatic GIST dx 02/2014 Found to have worsening renal function who ultimately underwent renal ultrasound showing a large 5 cm mass within the bladder with concern for invasion of the trigone with moderate hydroureteronephrosis. She presents to urology today for further evaluation an urgent basis.  Her creatinine has risen slightly from baseline of about 0.75 last year up to 1.04 in 4/18, 3.16 and 7/18 and 1.84 and 10/27/16.  She is currently being treated for presumed UTI with nitrofurantoin since yesterday.  No culture sent.    She is somewhat of a difficult historian today. She does give a history of ultimately being diagnosed with the abdominal pelvic tumor after palpating it herself several years ago. She continues to be very independent and has excellent functional status.  She denies a personal history of smoking.   She is currently on Sutent for her GIST.  She has no personal history of heart attack or stroke. She is not on any anticoagulation.   PMH: Past Medical History:  Diagnosis Date  . Anemia   . Anxiety   . Arthritis   . Cancer [facility])    GIST  . Depression   . GERD (gastroesophageal reflux disease)   . GIST (gastrointestinal stroma tumor), malignant, colon ([place])   . History of hiatal hernia   . History of kidney stones   . Hypertension    NO MEDS NOW  . IBS (irritable bowel syndrome)   . Tremors of nervous system    HEAD    Surgical History: Past Surgical History:  Procedure Laterality Date  . ABDOMINAL HYSTERECTOMY    . BREAST BIOPSY Right   . CATARACT EXTRACTION W/PHACO Right 01/28/2015   Procedure: CATARACT EXTRACTION PHACO AND INTRAOCULAR LENS PLACEMENT (IOC);  Surgeon:  Estill Cotta, MD;  Location: [facility] ORS;  Service: Ophthalmology;  Laterality: Right;  [name] 00:59.3 AP% 22.6 CDE 24.69 fluid pack lot #[id number]  . CATARACT EXTRACTION W/PHACO Left 02/11/2015   Procedure: CATARACT EXTRACTION PHACO AND INTRAOCULAR LENS PLACEMENT (IOC);  Surgeon: Estill Cotta, MD;  Location: [facility] ORS;  Service: Ophthalmology;  Laterality: Left;  [name] 01:18 AP% 22.8 CDE 32.38  fluid pack lot # [id number]  . CHOLECYSTECTOMY    . COLON SURGERY     COLOSTOMY AND REVERSAL  . STOMACH SURGERY     CANCER    Home Medications:  Allergies as of 10/28/2016      Reactions   Ciprofloxacin Other (See Comments)   GI distress, abd cramping   Other Swelling   Magic mouthwash   Penicillins Swelling   HIVES      Medication List       Accurate as of 10/28/16 11:59 PM. Always use your most recent med list.          aspirin-sod bicarb-citric acid 325 MG Tbef tablet Commonly known as:  ALKA-SELTZER Take 325 mg by mouth every 6 (six) hours as needed (indigestion).   diphenoxylate-atropine 2.5-0.025 MG tablet Commonly known as:  LOMOTIL TAKE 1 TABLET BY MOUTH EVERY 8 HOURS AS NEEDED FOR DIARRHEA   IMODIUM A-D PO Take 1 tablet by mouth as needed.   lisinopril 10 MG tablet Commonly known as:  PRINIVIL Take 1 tablet (10 mg total) by  mouth daily.   megestrol 40 MG tablet Commonly known as:  MEGACE daily three days a week   mometasone 50 MCG/ACT nasal spray Commonly known as:  NASONEX Place 2 sprays into the nose daily as needed (sinus drainage).   multivitamin tablet Take 1 tablet by mouth daily.   nitrofurantoin (macrocrystal-monohydrate) 100 MG capsule Commonly known as:  MACROBID Take 100 mg by mouth daily.   potassium chloride SA 20 MEQ tablet Commonly known as:  K-DUR,KLOR-CON Take 1 tablet (20 mEq total) by mouth daily.   RA VITAMIN B-12 TR 1000 MCG Tbcr Generic drug:  Cyanocobalamin Take 2 tablets by mouth daily.   ranitidine 150 MG tablet Commonly known  as:  ZANTAC Take 150 mg by mouth 2 (two) times daily.   SUNItinib 25 MG capsule Commonly known as:  SUTENT Take 1 capsule by mouth daily for 14 days then 7 days off.   VITAMIN D3 ADULT GUMMIES 1000 units Chew Generic drug:  Cholecalciferol Chew 1 each by mouth daily.   VITRON-C 65-125 MG Tabs Generic drug:  Iron-Vitamin C Take 2 tablets by mouth daily.   ZYRTEC PO Take 1 tablet by mouth daily.       Allergies:  Allergies  Allergen Reactions  . Ciprofloxacin Other (See Comments)    GI distress, abd cramping  . Other Swelling    Magic mouthwash  . Penicillins Swelling    HIVES    Family History: Family History  Problem Relation Age of Onset  . Breast cancer Maternal Grandmother     Social History:  reports that she has never smoked. She has never used smokeless tobacco. She reports that she does not drink alcohol or use drugs.  ROS: UROLOGY Frequent Urination?: Yes Hard to postpone urination?: Yes Burning/pain with urination?: No Get up at night to urinate?: Yes Leakage of urine?: Yes Urine stream starts and stops?: Yes Trouble starting stream?: No Do you have to strain to urinate?: No Blood in urine?: No Urinary tract infection?: Yes Sexually transmitted disease?: No Injury to kidneys or bladder?: No Painful intercourse?: No Weak stream?: Yes Currently pregnant?: No Vaginal bleeding?: No Last menstrual period?: n  Gastrointestinal Nausea?: No Vomiting?: No Indigestion/heartburn?: Yes Diarrhea?: No Constipation?: No  Constitutional Fever: No Night sweats?: No Weight loss?: No Fatigue?: Yes  Skin Skin rash/lesions?: No Itching?: No  Eyes Blurred vision?: No Double vision?: No  Ears/Nose/Throat Sore throat?: No Sinus problems?: No  Hematologic/Lymphatic Swollen glands?: No Easy bruising?: No  Cardiovascular Leg swelling?: No Chest pain?: No  Respiratory Cough?: No Shortness of breath?: Yes  Endocrine Excessive thirst?:  No  Musculoskeletal Back pain?: Yes Joint pain?: No  Neurological Headaches?: No Dizziness?: No  Psychologic Depression?: No Anxiety?: No  Physical Exam: BP (!) 146/70 (BP Location: Left Arm, Patient Position: Sitting, Cuff Size: Normal)   Pulse (!) 106   Ht 5\' 4"  (1.626 m)   Wt 128 lb 14.4 oz (58.5 kg)   BMI 22.13 kg/m   Constitutional:  Alert and oriented, No acute distress. HEENT: [place] AT, moist mucus membranes.  Trachea midline, no masses. Cardiovascular: No clubbing, cyanosis, or edema. Respiratory: Normal respiratory effort, no increased work of breathing. GI: Abdomen is soft, nontender, nondistended, no abdominal masses GU: Normal external genitalia. Normal urethral meatus. Skin: No rashes, bruises or suspicious lesions. Neurologic: Grossly intact, no focal deficits, moving all 4 extremities. Psychiatric: Somewhat tangential.  Laboratory Data: Lab Results  Component Value Date   WBC 4.6 10/27/2016   HGB 8.0 (L)  10/27/2016   HCT 23.3 (L) 10/27/2016   MCV 109.8 (H) 10/27/2016   PLT 355 10/27/2016    Lab Results  Component Value Date   CREATININE 1.84 (H) 10/27/2016    Urinalysis Results for orders placed or performed in visit on 10/28/16  Microscopic Examination  Result Value Ref Range   WBC, UA 11-30 (A) 0 - 5 /hpf   RBC, UA None seen 0 - 2 /hpf   Epithelial Cells (non renal) None seen 0 - 10 /hpf   Mucus, UA Present (A) Not Estab.   Bacteria, UA Few (A) None seen/Few  CULTURE, URINE COMPREHENSIVE  Result Value Ref Range   Urine Culture, Comprehensive Preliminary report    Organism ID, Bacteria Comment   Urinalysis, Complete  Result Value Ref Range   Specific Gravity, UA 1.010 1.005 - 1.030   pH, UA 5.5 5.0 - 7.5   Color, UA Yellow Yellow   Appearance Ur Clear Clear   Leukocytes, UA 1+ (A) Negative   Protein, UA 1+ (A) Negative/Trace   Glucose, UA Negative Negative   Ketones, UA Negative Negative   RBC, UA 1+ (A) Negative   Bilirubin, UA  Negative Negative   Urobilinogen, Ur 0.2 0.2 - 1.0 mg/dL   Nitrite, UA Negative Negative   Microscopic Examination See below:     Pertinent Imaging: CLINICAL DATA:  Acute renal failure  EXAM: RENAL / URINARY TRACT ULTRASOUND COMPLETE  COMPARISON:  None.  FINDINGS: Right Kidney:  Length: 10.9 cm. Echogenicity and renal cortical thickness are within normal limits. No mass or perinephric fluid visualized. There is moderate hydronephrosis on the right. No sonographically demonstrable calculus or ureterectasis.  Left Kidney:  Length: 9.5 cm. Echogenicity and renal cortical thickness are within normal limits. No mass or perinephric fluid visualized. There is moderate hydronephrosis on the left. No sonographically demonstrable calculus or ureterectasis.  Bladder:  There is a hypoechoic lesion along the posterior aspect of the urinary bladder which shows moderate vascularity measuring 4.6 x 2.0 cm. This mass may be infiltrating the posterior bladder wall. Note that there is slight postvoid residual in the urinary bladder.  IMPRESSION: Hypoechoic apparent mass along the posterior aspect of the urinary bladder which may be infiltrating into the posterior bladder wall. There is moderate hydronephrosis bilaterally, possibly due to a degree of obstruction of the ureterovesical junctions from the apparent bladder tumor. There is mild postvoid residual in the urinary bladder.  These results will be called to the ordering clinician or representative by the Radiologist Assistant, and communication documented in the PACS or zVision Dashboard.   Electronically Signed   By: Lowella Grip III M.D.   On: 10/27/2016 14:45  RUS personally reviewed today  Assessment & Plan:    1. Hydronephrosis, unspecified hydronephrosis type Secondary to obstructing mass in bladder vs. Pelvis mass - Urinalysis, Complete - lidocaine (XYLOCAINE) 2 % jelly 1 application; Place 1  application into the urethra once.  2. Malignant neoplasm of urinary bladder, unspecified site [facility] An) Cysto confirms presence of large bladder mass, etiology unclear, differential includes TCC vs. GIST I recommended proceeding to the operating room urgently for TURBT, possible bilateral retrograde pyelogram, possible ureteral stent placement. Risk of bleeding, infection, damage is running structures, need for Foley catheter, bladder perforation, failure to place stents were all discussed. If we're unable to place stents, she will need to be admitted for bilateral percutaneous approximately 2 placement. All of her questions were answered. - CULTURE, URINE COMPREHENSIVE  3. Acute renal failure,  unspecified acute renal failure type ([place]) Secondary to bilateral renal obstruction from obstructing mass We'll expedite intervention, if stents unable to be placed from below, we'll admit for perc tube placement Signs and symptoms of progressive renal failure were discussed including warning symptoms, advised to present to emergency room if she has any concerns prior to surgery  Case discussed with Dr. Baird Kay, [place] [address] [place], [place] [address] [phone]

## 2016-10-29 ENCOUNTER — Telehealth: Payer: Self-pay | Admitting: *Deleted

## 2016-10-29 ENCOUNTER — Telehealth: Payer: Self-pay | Admitting: Radiology

## 2016-10-29 ENCOUNTER — Other Ambulatory Visit: Payer: Self-pay | Admitting: Radiology

## 2016-10-29 ENCOUNTER — Encounter
Admission: RE | Admit: 2016-10-29 | Discharge: 2016-10-29 | Disposition: A | Discharge status: Home / Self Care | Payer: PPO | Source: Ambulatory Visit | Attending: Urology | Admitting: Urology

## 2016-10-29 ENCOUNTER — Other Ambulatory Visit: Payer: Self-pay | Admitting: *Deleted

## 2016-10-29 DIAGNOSIS — I10 Essential (primary) hypertension: Secondary | ICD-10-CM | POA: Insufficient documentation

## 2016-10-29 DIAGNOSIS — Z01818 Encounter for other preprocedural examination: Secondary | ICD-10-CM | POA: Insufficient documentation

## 2016-10-29 DIAGNOSIS — C679 Malignant neoplasm of bladder, unspecified: Secondary | ICD-10-CM | POA: Diagnosis not present

## 2016-10-29 DIAGNOSIS — C49A0 Gastrointestinal stromal tumor, unspecified site: Secondary | ICD-10-CM

## 2016-10-29 DIAGNOSIS — I447 Left bundle-branch block, unspecified: Secondary | ICD-10-CM | POA: Insufficient documentation

## 2016-10-29 DIAGNOSIS — N170 Acute kidney failure with tubular necrosis: Secondary | ICD-10-CM

## 2016-10-29 HISTORY — DX: Depression, unspecified: F32.A

## 2016-10-29 HISTORY — DX: Personal history of urinary calculi: Z87.442

## 2016-10-29 HISTORY — DX: Major depressive disorder, single episode, unspecified: F32.9

## 2016-10-29 HISTORY — DX: Anemia, unspecified: D64.9

## 2016-10-29 HISTORY — DX: Anxiety disorder, unspecified: F41.9

## 2016-10-29 HISTORY — DX: Irritable bowel syndrome, unspecified: K58.9

## 2016-10-29 MED ORDER — LIDOCAINE HCL 2 % EX GEL
1.0000 "application " | Freq: Once | CUTANEOUS | Status: AC
  Administered 2016-10-29: 1 via URETHRAL

## 2016-10-29 NOTE — Progress Notes (Signed)
Spoke with patient- see rn new phone note

## 2016-10-29 NOTE — Patient Instructions (Signed)
  Your procedure is scheduled on: November 02, 2016 [facility] ) Report to Same Day Surgery 2nd floor medical mall ([place] Entrance-take elevator on left to 2nd floor.  Check in with surgery information desk.) To find out your arrival time please call [phone] between 1PM - 3PM on October 30, 2016 (FRIDAY)  Remember: Instructions that are not followed completely may result in serious medical risk, up to and including death, or upon the discretion of your surgeon and anesthesiologist your surgery may need to be rescheduled.    _x___ 1. Do not eat food or drink liquids after midnight. No gum chewing or hard candies                             __x__ 2. No Alcohol for 24 hours before or after surgery.   __x__3. No Smoking for 24 prior to surgery.   ____  4. Bring all medications with you on the day of surgery if instructed.    __x__ 5. Notify your doctor if there is any change in your medical condition     (cold, fever, infections).     Do not wear jewelry, make-up, hairpins, clips or nail polish.  Do not wear lotions, powders, or perfumes. You may wear deodorant.  Do not shave 48 hours prior to surgery. Men may shave face and neck.  Do not bring valuables to the hospital.    [facility] is not responsible for any belongings or valuables.               Contacts, dentures or bridgework may not be worn into surgery.  Leave your suitcase in the car. After surgery it may be brought to your room.  For patients admitted to the hospital, discharge time is determined by your  treatment team                    Patients discharged the day of surgery will not be allowed to drive home.  You will need someone to drive you home and stay with you the night of your procedure.    Please read over the following fact sheets that you were given:   [facility] Preparing for Surgery and or MRSA Information   TAKE THE FOLLOWING MEDICATION THE MORNING OF SURGERY WITH A SIP OF WATER :  1.  RANITIDINE  2.  3.  4.  5.  6.  ____Fleets enema or Magnesium Citrate as directed.   ___ Use CHG Soap or sage wipes as directed on instruction sheet   ____ Use inhalers on the day of surgery and bring to hospital day of surgery  ____ Stop Metformin and Janumet 2 days prior to surgery.    ____ Take 1/2 of usual insulin dose the night before surgery and none on the morning surgery  _x___ Follow recommendations from Cardiologist, Pulmonologist or PCP regarding          stopping Aspirin, Coumadin, Plavix ,Eliquis, Effient, or Pradaxa, and Pletal.  X____Stop Anti-inflammatories such as Advil, Aleve, Ibuprofen, Motrin, Naproxen, Naprosyn, Goodies powders or aspirin products. OK to take Tylenol   (STOP ALKA-SELTZER TODAY )   _x___ Stop supplements until after surgery.  But may continue Vitamin D, Vitamin B, and multivitamin      ____ Bring C-Pap to the hospital.

## 2016-10-29 NOTE — Care Management (Signed)
EKG reviewed. No significant changes. No further workup needed.

## 2016-10-29 NOTE — Progress Notes (Signed)
Reviewed pt's chart. Dr. Rogue Bussing made aware that bladder TURP is being planned on Monday 8/13. Per md- will plan for pet scan on Friday next week 8/17. Per md- cnl apts with Dr. Jacinto Reap on Tuesday next week and move apt out by 1 week in [place] sent to scheduling team to arrange.  Call attempt to patient. Left vm for patient- requesting return phone call to discuss her medical care.

## 2016-10-29 NOTE — Telephone Encounter (Signed)
Pt scheduled for TURBT with Dr Erlene Quan on 11/02/16. Made pt aware of surgery date, pre-admit testing appt & to call Friday prior to surgery for arrival time to SDS. Questions answered. Pt voices understanding.

## 2016-10-29 NOTE — Pre-Procedure Instructions (Signed)
Dr. Marcello Moores reviewed EKG and stated no workup needed for surgery

## 2016-10-29 NOTE — Telephone Encounter (Signed)
Patient returned my phone call regarding her plan of care.  She stated that Dr. Erlene Quan performed a cystoscopy with scraping her bladder.  She stated that she was very thankful for her doctors taking care of her.    Please inform patient that I ordered a PET scan for further evaluation of her disease. Please set up the PET scan ASAP.  I explained that her pet scan apt - 8/16 @11  am arrival at medical mall. NPO 6 hr prior to exam. F/U w/ Dr B 8/21 @ 1:45 pm [place].  Pt then stated that she has to drive her husband to apts on 8/15 and 8/16. She will not be able to keep these apts. I explained to patient that Shirlean Mylar in cancer center scheduling would call her with a new apt time.    She asked that we leave a vm with the new apts times as she is extremely overwhelmed with information. She is having a difficult time remembering basic facts due to the anxiety of her diagnosis and hearing loss barriers. She had difficulty understanding the reason for the pet scan and the time/dates for the apts that were given to her.  I discussed potentially having a caregiver accompany her to the next apt and encouraged her to ask someone to come with her to all appointments or serve as a point of contact. She states that she is currently the caregiver of her husband, who is not in great condition at this time to help her. She stated that the last time she had to go in the hospital for a procedure, her husband had a melt-down and had much difficulty adjusting without her. She stated that she doesn't want her son involved in her care at this time. "He works full time and owns his own business and he doesn't have time to take off for my appointments. I don't want him to know anything about my care right now. I tried to talk to him the other night about my upcoming procedure for my bladder and my son couldn't handle it. He got so upset about the situation, so I rather not have him know about any other appointments. I am afraid that I'm  going now have to ask my son to come sit with my husband while I have surgery on Monday." She also voiced concerns about not having the pet scan r/s for Monday 8/20 as she "didn't know what other apts Dr. Erlene Quan would set up for me on  11/09/16"  I reached back out to [place] in [place] [facility]. Pet scan not able to be r/s until 8/21 at 7am. Will keep apt on 8/21 at 145pm with Dr. Jacinto Reap in [place] [facility]  To discuss her pet scan results.- Shirlean Mylar will see if pet dept would be willing to provide a stat read.  She will call patient and leave vm with new pet scan date/ time instructions.

## 2016-10-31 NOTE — Progress Notes (Signed)
   10/28/16  CC:  Chief Complaint  Patient presents with  . Hydronephrosis    HPI: See H&P  Blood pressure (!) 146/70, pulse (!) 106, height 5\' 4"  (1.626 m), weight 128 lb 14.4 oz (58.5 kg). NED. A&Ox3.   No respiratory distress   Abd soft, NT, ND Normal external genitalia with patent urethral meatus  Cystoscopy Procedure Note  Patient identification was confirmed, informed consent was obtained, and patient was prepped using Betadine solution.  Lidocaine jelly was administered per urethral meatus.    Preoperative abx where received prior to procedure.    Procedure: - Flexible cystoscope introduced, without any difficulty.   - Thorough search of the bladder revealed:    Large 5+ cm necrotic nodular mass infiltrating the trigone, UA was unable to be identified. Maj. the bladder is unremarkable that there is limitation of cystoscopy today related to some of fairly significant debris   Post-Procedure: - Patient tolerated the procedure well  Assessment/ Plan: See H&P   Hollice Espy, MD

## 2016-11-01 LAB — CULTURE, URINE COMPREHENSIVE

## 2016-11-02 ENCOUNTER — Encounter
Admission: RE | Disposition: A | Discharge status: Home / Self Care | Payer: Self-pay | Source: Ambulatory Visit | Attending: Urology

## 2016-11-02 ENCOUNTER — Ambulatory Visit: Payer: PPO | Admitting: Anesthesiology

## 2016-11-02 ENCOUNTER — Ambulatory Visit
Admission: RE | Admit: 2016-11-02 | Discharge: 2016-11-02 | Disposition: A | Discharge status: Home / Self Care | Payer: PPO | Source: Ambulatory Visit | Attending: Urology | Admitting: Urology

## 2016-11-02 DIAGNOSIS — Z8500 Personal history of malignant neoplasm of unspecified digestive organ: Secondary | ICD-10-CM | POA: Insufficient documentation

## 2016-11-02 DIAGNOSIS — C670 Malignant neoplasm of trigone of bladder: Secondary | ICD-10-CM | POA: Diagnosis not present

## 2016-11-02 DIAGNOSIS — Z87442 Personal history of urinary calculi: Secondary | ICD-10-CM | POA: Insufficient documentation

## 2016-11-02 DIAGNOSIS — C679 Malignant neoplasm of bladder, unspecified: Secondary | ICD-10-CM

## 2016-11-02 DIAGNOSIS — N1330 Unspecified hydronephrosis: Secondary | ICD-10-CM | POA: Insufficient documentation

## 2016-11-02 DIAGNOSIS — I10 Essential (primary) hypertension: Secondary | ICD-10-CM | POA: Insufficient documentation

## 2016-11-02 DIAGNOSIS — Z888 Allergy status to other drugs, medicaments and biological substances status: Secondary | ICD-10-CM | POA: Insufficient documentation

## 2016-11-02 DIAGNOSIS — K219 Gastro-esophageal reflux disease without esophagitis: Secondary | ICD-10-CM | POA: Insufficient documentation

## 2016-11-02 DIAGNOSIS — Z79899 Other long term (current) drug therapy: Secondary | ICD-10-CM | POA: Diagnosis not present

## 2016-11-02 DIAGNOSIS — Z880 Allergy status to penicillin: Secondary | ICD-10-CM | POA: Diagnosis not present

## 2016-11-02 DIAGNOSIS — F419 Anxiety disorder, unspecified: Secondary | ICD-10-CM | POA: Diagnosis not present

## 2016-11-02 DIAGNOSIS — K589 Irritable bowel syndrome without diarrhea: Secondary | ICD-10-CM | POA: Diagnosis not present

## 2016-11-02 DIAGNOSIS — N329 Bladder disorder, unspecified: Secondary | ICD-10-CM | POA: Diagnosis present

## 2016-11-02 DIAGNOSIS — Z881 Allergy status to other antibiotic agents status: Secondary | ICD-10-CM | POA: Diagnosis not present

## 2016-11-02 DIAGNOSIS — F329 Major depressive disorder, single episode, unspecified: Secondary | ICD-10-CM | POA: Insufficient documentation

## 2016-11-02 DIAGNOSIS — D494 Neoplasm of unspecified behavior of bladder: Secondary | ICD-10-CM | POA: Diagnosis not present

## 2016-11-02 DIAGNOSIS — M1990 Unspecified osteoarthritis, unspecified site: Secondary | ICD-10-CM | POA: Diagnosis not present

## 2016-11-02 HISTORY — PX: CYSTOSCOPY W/ RETROGRADES: SHX1426

## 2016-11-02 HISTORY — PX: CYSTOSCOPY WITH STENT PLACEMENT: SHX5790

## 2016-11-02 HISTORY — PX: TRANSURETHRAL RESECTION OF BLADDER TUMOR: SHX2575

## 2016-11-02 SURGERY — TURBT (TRANSURETHRAL RESECTION OF BLADDER TUMOR)
Anesthesia: General | Site: Ureter | Wound class: Clean Contaminated

## 2016-11-02 MED ORDER — FLUORESCEIN SODIUM 10 % IV SOLN
INTRAVENOUS | Status: AC
  Filled 2016-11-02: qty 5

## 2016-11-02 MED ORDER — DEXAMETHASONE SODIUM PHOSPHATE 10 MG/ML IJ SOLN
INTRAMUSCULAR | Status: AC
  Filled 2016-11-02: qty 1

## 2016-11-02 MED ORDER — ONDANSETRON HCL 4 MG/2ML IJ SOLN
INTRAMUSCULAR | Status: DC | PRN
  Administered 2016-11-02: 4 mg via INTRAVENOUS

## 2016-11-02 MED ORDER — ACETAMINOPHEN 10 MG/ML IV SOLN
INTRAVENOUS | Status: DC | PRN
  Administered 2016-11-02: 100 mg via INTRAVENOUS

## 2016-11-02 MED ORDER — ROCURONIUM BROMIDE 50 MG/5ML IV SOLN
INTRAVENOUS | Status: AC
  Filled 2016-11-02: qty 1

## 2016-11-02 MED ORDER — DEXAMETHASONE SODIUM PHOSPHATE 10 MG/ML IJ SOLN
INTRAMUSCULAR | Status: DC | PRN
  Administered 2016-11-02: 5 mg via INTRAVENOUS

## 2016-11-02 MED ORDER — LIDOCAINE HCL (PF) 2 % IJ SOLN
INTRAMUSCULAR | Status: AC
  Filled 2016-11-02: qty 2

## 2016-11-02 MED ORDER — FENTANYL CITRATE (PF) 100 MCG/2ML IJ SOLN
INTRAMUSCULAR | Status: DC | PRN
  Administered 2016-11-02: 25 ug via INTRAVENOUS
  Administered 2016-11-02: 50 ug via INTRAVENOUS
  Administered 2016-11-02 (×2): 25 ug via INTRAVENOUS

## 2016-11-02 MED ORDER — FAMOTIDINE 20 MG PO TABS
ORAL_TABLET | ORAL | Status: AC
  Administered 2016-11-02: 20 mg via ORAL
  Filled 2016-11-02: qty 1

## 2016-11-02 MED ORDER — OXYBUTYNIN CHLORIDE 5 MG PO TABS
5.0000 mg | ORAL_TABLET | Freq: Three times a day (TID) | ORAL | Status: DC
Start: 2016-11-02 — End: 2016-11-02
  Administered 2016-11-02: 5 mg via ORAL

## 2016-11-02 MED ORDER — FENTANYL CITRATE (PF) 100 MCG/2ML IJ SOLN: 25.0000 ug | INTRAMUSCULAR | Status: DC | PRN

## 2016-11-02 MED ORDER — OXYBUTYNIN CHLORIDE 5 MG PO TABS: 5.0000 mg | ORAL_TABLET | Freq: Three times a day (TID) | ORAL | 0 refills | Status: DC | PRN

## 2016-11-02 MED ORDER — PROPOFOL 10 MG/ML IV BOLUS
INTRAVENOUS | Status: DC | PRN
  Administered 2016-11-02: 90 mg via INTRAVENOUS

## 2016-11-02 MED ORDER — CIPROFLOXACIN IN D5W 400 MG/200ML IV SOLN
400.0000 mg | Freq: Once | INTRAVENOUS | Status: AC
  Administered 2016-11-02: 400 mg via INTRAVENOUS

## 2016-11-02 MED ORDER — CIPROFLOXACIN IN D5W 400 MG/200ML IV SOLN
INTRAVENOUS | Status: AC
  Filled 2016-11-02: qty 200

## 2016-11-02 MED ORDER — LIDOCAINE HCL (CARDIAC) 20 MG/ML IV SOLN
INTRAVENOUS | Status: DC | PRN
  Administered 2016-11-02: 60 mg via INTRAVENOUS

## 2016-11-02 MED ORDER — IOTHALAMATE MEGLUMINE 43 % IV SOLN
INTRAVENOUS | Status: DC | PRN
  Administered 2016-11-02: 30 mL via URETHRAL

## 2016-11-02 MED ORDER — SUGAMMADEX SODIUM 200 MG/2ML IV SOLN
INTRAVENOUS | Status: AC
  Filled 2016-11-02: qty 2

## 2016-11-02 MED ORDER — ROCURONIUM BROMIDE 100 MG/10ML IV SOLN
INTRAVENOUS | Status: DC | PRN
  Administered 2016-11-02: 30 mg via INTRAVENOUS
  Administered 2016-11-02: 5 mg via INTRAVENOUS

## 2016-11-02 MED ORDER — LACTATED RINGERS IV SOLN
INTRAVENOUS | Status: DC
  Administered 2016-11-02: 11:00:00 via INTRAVENOUS

## 2016-11-02 MED ORDER — EPHEDRINE SULFATE 50 MG/ML IJ SOLN
INTRAMUSCULAR | Status: AC
  Filled 2016-11-02: qty 1

## 2016-11-02 MED ORDER — FAMOTIDINE 20 MG PO TABS
20.0000 mg | ORAL_TABLET | Freq: Once | ORAL | Status: AC
  Administered 2016-11-02: 20 mg via ORAL

## 2016-11-02 MED ORDER — FENTANYL CITRATE (PF) 100 MCG/2ML IJ SOLN
INTRAMUSCULAR | Status: AC
  Filled 2016-11-02: qty 2

## 2016-11-02 MED ORDER — SUGAMMADEX SODIUM 200 MG/2ML IV SOLN
INTRAVENOUS | Status: DC | PRN
  Administered 2016-11-02: 150 mg via INTRAVENOUS

## 2016-11-02 MED ORDER — HYDROCODONE-ACETAMINOPHEN 5-325 MG PO TABS: 1.0000 | ORAL_TABLET | Freq: Four times a day (QID) | ORAL | 0 refills | Status: DC | PRN

## 2016-11-02 MED ORDER — FLUORESCEIN SODIUM 10 % IV SOLN
INTRAVENOUS | Status: DC | PRN
  Administered 2016-11-02: 25 mg via INTRAVENOUS
  Administered 2016-11-02: 50 mg via INTRAVENOUS

## 2016-11-02 MED ORDER — OXYBUTYNIN CHLORIDE 5 MG PO TABS
ORAL_TABLET | ORAL | Status: DC
Start: 2016-11-02 — End: 2016-11-02
  Filled 2016-11-02: qty 1

## 2016-11-02 MED ORDER — PROPOFOL 10 MG/ML IV BOLUS
INTRAVENOUS | Status: AC
  Filled 2016-11-02: qty 20

## 2016-11-02 MED ORDER — ONDANSETRON HCL 4 MG/2ML IJ SOLN: 4.0000 mg | Freq: Once | INTRAMUSCULAR | Status: DC | PRN

## 2016-11-02 MED ORDER — PHENYLEPHRINE HCL 10 MG/ML IJ SOLN
INTRAMUSCULAR | Status: DC | PRN
  Administered 2016-11-02 (×4): 100 ug via INTRAVENOUS
  Administered 2016-11-02: 50 ug via INTRAVENOUS

## 2016-11-02 MED ORDER — ONDANSETRON HCL 4 MG/2ML IJ SOLN
INTRAMUSCULAR | Status: AC
  Filled 2016-11-02: qty 2

## 2016-11-02 SURGICAL SUPPLY — 36 items
BAG DRAIN CYSTO-URO LG1000N (MISCELLANEOUS) ×4 IMPLANT
BAG URO DRAIN 2000ML W/SPOUT (MISCELLANEOUS) IMPLANT
CATH FOL 2WAY LX 18X30 (CATHETERS) ×4 IMPLANT
CATH FOLEY 2WAY  5CC 16FR (CATHETERS)
CATH URETL 5X70 OPEN END (CATHETERS) ×4 IMPLANT
CATH URTH 16FR FL 2W BLN LF (CATHETERS) IMPLANT
CONRAY 43 FOR UROLOGY 50M (MISCELLANEOUS) ×4 IMPLANT
DRAPE UTILITY 15X26 TOWEL STRL (DRAPES) ×4 IMPLANT
DRSG TELFA 4X3 1S NADH ST (GAUZE/BANDAGES/DRESSINGS) ×8 IMPLANT
ELECT LOOP 22F BIPOLAR SML (ELECTROSURGICAL)
ELECT REM PT RETURN 9FT ADLT (ELECTROSURGICAL)
ELECTRODE LOOP 22F BIPOLAR SML (ELECTROSURGICAL) IMPLANT
ELECTRODE REM PT RTRN 9FT ADLT (ELECTROSURGICAL) IMPLANT
GLOVE BIO SURGEON STRL SZ 6.5 (GLOVE) ×3 IMPLANT
GLOVE BIO SURGEONS STRL SZ 6.5 (GLOVE) ×1
GOWN STRL REUS W/ TWL LRG LVL3 (GOWN DISPOSABLE) ×4 IMPLANT
GOWN STRL REUS W/TWL LRG LVL3 (GOWN DISPOSABLE) ×4
KIT RM TURNOVER CYSTO AR (KITS) ×4 IMPLANT
LOOP CUT BIPOLAR 24F LRG (ELECTROSURGICAL) ×4 IMPLANT
NDL SAFETY ECLIPSE 18X1.5 (NEEDLE) ×2 IMPLANT
NEEDLE HYPO 18GX1.5 SHARP (NEEDLE) ×2
PACK CYSTO AR (MISCELLANEOUS) ×4 IMPLANT
SCRUB POVIDONE IODINE 4 OZ (MISCELLANEOUS) ×4 IMPLANT
SENSORWIRE 0.038 NOT ANGLED (WIRE) ×4
SET CYSTO W/LG BORE CLAMP LF (SET/KITS/TRAYS/PACK) ×4 IMPLANT
SET IRRIG Y TYPE TUR BLADDER L (SET/KITS/TRAYS/PACK) ×8 IMPLANT
SET IRRIGATING DISP (SET/KITS/TRAYS/PACK) ×4 IMPLANT
SOL .9 NS 3000ML IRR  AL (IV SOLUTION) ×24
SOL .9 NS 3000ML IRR UROMATIC (IV SOLUTION) ×24 IMPLANT
STENT URET 6FRX24 CONTOUR (STENTS) IMPLANT
STENT URET 6FRX26 CONTOUR (STENTS) IMPLANT
STENT URO INLAY 6FRX24CM (STENTS) ×8 IMPLANT
SURGILUBE 2OZ TUBE FLIPTOP (MISCELLANEOUS) ×4 IMPLANT
SYRINGE IRR TOOMEY STRL 70CC (SYRINGE) ×8 IMPLANT
WATER STERILE IRR 1000ML POUR (IV SOLUTION) ×4 IMPLANT
WIRE SENSOR 0.038 NOT ANGLED (WIRE) ×2 IMPLANT

## 2016-11-02 NOTE — Op Note (Signed)
Date of procedure: 11/02/16  Preoperative diagnosis:  1. Bladder mass involving trigone 2. Bilateral hydronephrosis   Postoperative diagnosis:  1. Same as above   Procedure: 1. TURBT, large 2. Bilateral retrograde pyelogram 3. Bilateral ureteral stent placement  Surgeon: Hollice Espy, MD  Anesthesia: General  Complications: None  Intraoperative findings: Nodular, infiltrative bladder tumor involving entire trigone and bladder neck. Bimanual exam somewhat fixed on left side. Severe bilateral hydroureteronephrosis down to the level of the bladder. Stents placed.  EBL: 100 cc  Specimens: Bladder tumor  Drains: 6 x 24 French double-J ureteral stent (Bard Optima) bilaterally, 12 French two-way Foley catheter with 30 cc balloon  Indication: Courtney [name] is a 76 y.o. patient with history of metastatic gist tumor with rising creatinine found to have bilateral hydronephrosis and a large bladder mass.  After reviewing the management options for treatment, she elected to proceed with the above surgical procedure(s). We have discussed the potential benefits and risks of the procedure, side effects of the proposed treatment, the likelihood of the patient achieving the goals of the procedure, and any potential problems that might occur during the procedure or recuperation. Informed consent has been obtained.  Description of procedure:  The patient was taken to the operating room and general anesthesia was induced.  The patient was placed in the dorsal lithotomy position, prepped and draped in the usual sterile fashion, and preoperative antibiotics were administered. A preoperative time-out was performed.   A 21 French scope was advanced per urethra into the bladder. The bladder was drained and a nodular necrotic-appearing tumor was identified at the bladder neck and trigone. Visualization was suboptimal requiring continuous bladder irrigation. The 110 French scope was then exchanged for a 26  [place] resectoscope. The bladder tumor itself was nodular, infiltrative, and necrotic involving the entire trigone with a folded like appearance on the edges. On the periphery of the tumor adjacent to the bladder neck, it appeared to have normal overlying urothelium with large submucosal nodules. The trigone was completely distorted and the UOs were unable to be identified.  The tumor measured greater than 6 cm in diameter. At this point in time, a bipolar loop was then used to begin to resect the tumor. Attempted to resect this down to the level of normal bladder, but no normal bladder was ever apparent. Bimanual exam revealed a large nodular tumor involving the bladder neck and trigone which was palpable through the vaginal wall. The pelvis was somewhat fixed on the left side which was somewhat concerning. Specifically, the tumor was resected overlying the right hemitrigone the expected location of the ureter. The UO was very difficult to visualize. I did administer IV flurocene to help with ureteral identification. Ultimately, did find a small opening which seemed to be consistent with the ureteral orifice. I was able to cannulate this with a 5 [place] open-ended ureteral catheter and perform a retrograde pyelogram. The ureter was extremely capacious and after injecting nearly 20 cc of [place], I was only able to fill the ureter up to the mid ureter. Wire was then placed up to level of the kidney. This was snapped in place as a safety wire and attention was turned to resection of the left hemitrigone.  The tumor was resected down until several potential areas of the ureter were identified. An open-ended ureteral catheter was placed through one potential location of the ureter, but on contrast administered she was clear that this was a small extraperitoneal bladder perforation. Multiply, I was able  to find the true UO. The orientation of this orifice was somewhat distorted, presumably due to presence of a trucking  tumor. A wire was then placed up to level of the kidney. An open-ended ureteral catheter was advanced to the proximal ureter and a retrograde pyelogram was performed. This revealed moderate hydroureteronephrosis down to level of the bladder without extravasation. I then advanced a 6 x 24 French double-J ureteral stent over the wire up to level of the renal pelvis. The wire was partially withdrawn until full coil was noted within the renal pelvis. The wire was then fully withdrawn and full coil stent within the bladder. Next, a stent was placed over the right safety wire in a similar fashion to previous the describe such that bilateral ureteral orifices were in good position within the bladder. Upon placement of the stents, good reflux of hilar yellow urine was seen bilaterally.  The majority of tumor was resected. It was clear that there was some residual tumor. Careful hemostasis is then achieved. The bladder was then drained and all chips were your gated out of the bladder. After adequate hemostasis was achieved, an 54 [place] two-way Foley catheter was placed. The balloon was filled with 30 cc of sterile water. The urine remained only lightly kinked with an orange tension. She was then cleaned and dried, repositioned the supine position, reversed from anesthesia, and taken to the PACU in stable condition.  Plan: Plan for follow-up next week in the office to discuss pathology and voiding trial. I'm suspicious for either muscle invasive bladder cancer or progression of her just with infiltrative invasion into the bladder.  Finding were discussed with her son.  Hollice Espy, M.D.

## 2016-11-02 NOTE — Interval H&P Note (Signed)
History and Physical Interval Note:  11/02/2016 12:50 PM  Courtney [name]  has presented today for surgery, with the diagnosis of BLADDER CANCER  The various methods of treatment have been discussed with the patient and family. After consideration of risks, benefits and other options for treatment, the patient has consented to  Procedure(s): TRANSURETHRAL RESECTION OF BLADDER TUMOR (TURBT) >5CM (N/A) CYSTOSCOPY WITH RETROGRADE PYELOGRAM (Bilateral) CYSTOSCOPY WITH STENT PLACEMENT (Bilateral) as a surgical intervention .  The patient's history has been reviewed, patient examined, no change in status, stable for surgery.  I have reviewed the patient's chart and labs.  Questions were answered to the patient's satisfaction.    [facility] CTAB  Hollice Espy

## 2016-11-02 NOTE — Anesthesia Preprocedure Evaluation (Signed)
Anesthesia Evaluation  Patient identified by MRN, date of birth, ID band Patient awake    Reviewed: Allergy & Precautions, NPO status , Patient's Chart, lab work & pertinent test results  History of Anesthesia Complications Negative for: history of anesthetic complications  Airway Mallampati: II       Dental   Pulmonary neg pulmonary ROS,           Cardiovascular hypertension, Pt. on medications      Neuro/Psych Anxiety Depression negative neurological ROS     GI/Hepatic hiatal hernia, GERD  Medicated and Controlled,  Endo/Other    Renal/GU      Musculoskeletal   Abdominal   Peds  Hematology  (+) anemia ,   Anesthesia Other Findings   Reproductive/Obstetrics                             Anesthesia Physical Anesthesia Plan  ASA: II  Anesthesia Plan: General   Post-op Pain Management:    Induction: Intravenous  PONV Risk Score and Plan:   Airway Management Planned: Oral ETT  Additional Equipment:   Intra-op Plan:   Post-operative Plan:   Informed Consent: I have reviewed the patients History and Physical, chart, labs and discussed the procedure including the risks, benefits and alternatives for the proposed anesthesia with the patient or authorized representative who has indicated his/her understanding and acceptance.     Plan Discussed with:   Anesthesia Plan Comments:         Anesthesia Quick Evaluation

## 2016-11-02 NOTE — Discharge Instructions (Signed)
Transurethral Resection of Bladder Tumor (TURBT) or Bladder Biopsy   Definition:  Transurethral Resection of the Bladder Tumor is a surgical procedure used to diagnose and remove tumors within the bladder. TURBT is the most common treatment for early stage bladder cancer.  General instructions:     Your recent bladder surgery requires very little post hospital care but some definite precautions.  Despite the fact that no skin incisions were used, the area around the bladder incisions are raw and covered with scabs to promote healing and prevent bleeding. Certain precautions are needed to insure that the scabs are not disturbed over the next 2-4 weeks while the healing proceeds.  Because the raw surface inside your bladder and the irritating effects of urine you may expect frequency of urination and/or urgency (a stronger desire to urinate) and perhaps even getting up at night more often. This will usually resolve or improve slowly over the healing period. You may see some blood in your urine over the first 6 weeks. Do not be alarmed, even if the urine was clear for a while. Get off your feet and drink lots of fluids until clearing occurs. If you start to pass clots or don't improve call us.  Diet:  You may return to your normal diet immediately. Because of the raw surface of your bladder, alcohol, spicy foods, foods high in acid and drinks with caffeine may cause irritation or frequency and should be used in moderation. To keep your urine flowing freely and avoid constipation, drink plenty of fluids during the day (8-10 glasses). Tip: Avoid cranberry juice because it is very acidic.  Activity:  Your physical activity doesn't need to be restricted. However, if you are very active, you may see some blood in the urine. We suggest that you reduce your activity under the circumstances until the bleeding has stopped.  Bowels:  It is important to keep your bowels regular during the postoperative  period. Straining with bowel movements can cause bleeding. A bowel movement every other day is reasonable. Use a mild laxative if needed, such as milk of magnesia 2-3 tablespoons, or 2 Dulcolax tablets. Call if you continue to have problems. If you had been taking narcotics for pain, before, during or after your surgery, you may be constipated. Take a laxative if necessary.    Medication:  You should resume your pre-surgery medications unless told not to. In addition you may be given an antibiotic to prevent or treat infection. Antibiotics are not always necessary. All medication should be taken as prescribed until the bottles are finished unless you are having an unusual reaction to one of the drugs.   [place], [place] [id number] [phone]    Indwelling Urinary Catheter Care, Adult Take good care of your catheter to keep it working and to prevent problems. How to wear your catheter Attach your catheter to your leg with tape (adhesive tape) or a leg strap. Make sure it is not too tight. If you use tape, remove any bits of tape that are already on the catheter. How to wear a drainage bag You should have:  A large overnight bag.  A small leg bag.  Overnight Bag You may wear the overnight bag at any time. Always keep the bag below the level of your bladder but off the floor. When you sleep, put a clean plastic bag in a wastebasket. Then hang the bag inside the wastebasket. Leg Bag Never wear the leg bag at night. Always wear the  leg bag below your knee. Keep the leg bag secure with a leg strap or tape. How to care for your skin  Clean the skin around the catheter at least once every day.  Shower every day. Do not take baths.  Put creams, lotions, or ointments on your genital area only as told by your doctor.  Do not use powders, sprays, or lotions on your genital area. How to clean your catheter and your skin 1. Wash your hands with soap and  water. 2. Wet a washcloth in warm water and gentle (mild) soap. 3. Use the washcloth to clean the skin where the catheter enters your body. Clean downward and wipe away from the catheter in small circles. Do not wipe toward the catheter. 4. Pat the area dry with a clean towel. Make sure to clean off all soap. How to care for your drainage bags Empty your drainage bag when it is ?- full or at least 2-3 times a day. Replace your drainage bag once a month or sooner if it starts to smell bad or look dirty. Do not clean your drainage bag unless told by your doctor. Emptying a drainage bag  Supplies Needed  Rubbing alcohol.  Gauze pad or cotton ball.  Tape or a leg strap.  Steps 1. Wash your hands with soap and water. 2. Separate (detach) the bag from your leg. 3. Hold the bag over the toilet or a clean container. Keep the bag below your hips and bladder. This stops pee (urine) from going back into the tube. 4. Open the pour spout at the bottom of the bag. 5. Empty the pee into the toilet or container. Do not let the pour spout touch any surface. 6. Put rubbing alcohol on a gauze pad or cotton ball. 7. Use the gauze pad or cotton ball to clean the pour spout. 8. Close the pour spout. 9. Attach the bag to your leg with tape or a leg strap. 10. Wash your hands.  Changing a drainage bag Supplies Needed  Alcohol wipes.  A clean drainage bag.  Adhesive tape or a leg strap.  Steps 1. Wash your hands with soap and water. 2. Separate the dirty bag from your leg. 3. Pinch the rubber catheter with your fingers so that pee does not spill out. 4. Separate the catheter tube from the drainage tube where these tubes connect (at the connection valve). Do not let the tubes touch any surface. 5. Clean the end of the catheter tube with an alcohol wipe. Use a different alcohol wipe to clean the end of the drainage tube. 6. Connect the catheter tube to the drainage tube of the clean bag. 7. Attach  the new bag to the leg with adhesive tape or a leg strap. 8. Wash your hands.  How to prevent infection and other problems  Never pull on your catheter or try to remove it. Pulling can damage tissue in your body.  Always wash your hands before and after touching your catheter.  If a leg strap gets wet, replace it with a dry one.  Drink enough fluids to keep your pee clear or pale yellow, or as told by your doctor.  Do not let the drainage bag or tubing touch the floor.  Wear cotton underwear.  If you are female, wipe from front to back after you poop (have a bowel movement).  Check on the catheter often to make sure it works and the tubing is not twisted. Get help if:  Your pee is cloudy.  Your pee smells unusually bad.  Your pee is not draining into the bag.  Your tube gets clogged.  Your catheter starts to leak.  Your bladder feels full. Get help right away if:  You have redness, swelling, or pain where the catheter enters your body.  You have fluid, pus, or a bad smell coming from the area where the catheter enters your body.  The area where the catheter enters your body feels warm.  You have a fever.  You have pain in your: ? Stomach (abdomen). ? Legs. ? Lower back. ? Bladder.  You see blood fill the catheter.  Your pee is pink or red.  You feel sick to your stomach (nauseous).  You throw up (vomit).  You have chills.  Your catheter gets pulled out. This information is not intended to replace advice given to you by your health care provider. Make sure you discuss any questions you have with your health care provider. Document Released: 07/04/2012 Document Revised: 02/05/2016 Document Reviewed: 08/22/2013 Elsevier Interactive Patient Education  2018 [employer]   1) The drugs that you were given will stay in your system until tomorrow so for the next 24 hours you should not:  A) Drive an  automobile B) Make any legal decisions C) Drink any alcoholic beverage   2) You may resume regular meals tomorrow.  Today it is better to start with liquids and gradually work up to solid foods.  You may eat anything you prefer, but it is better to start with liquids, then soup and crackers, and gradually work up to solid foods.   3) Please notify your doctor immediately if you have any unusual bleeding, trouble breathing, redness and pain at the surgery site, drainage, fever, or pain not relieved by medication.    4) Additional Instructions:        Please contact your physician with any problems or Same Day Surgery at [phone], Monday through Friday 6 am to 4 pm, or [place] at [facility] number at [phone].

## 2016-11-02 NOTE — Transfer of Care (Signed)
Immediate Anesthesia Transfer of Care Note  Patient: Courtney [name]  Procedure(s) Performed: Procedure(s): TRANSURETHRAL RESECTION OF BLADDER TUMOR (TURBT) >5CM (N/A) CYSTOSCOPY WITH RETROGRADE PYELOGRAM (Bilateral) CYSTOSCOPY WITH STENT PLACEMENT (Bilateral)  Patient Location: PACU  Anesthesia Type:General  Level of Consciousness: awake  Airway & Oxygen Therapy: Patient Spontanous Breathing and Patient connected to face mask oxygen  Post-op Assessment: Report given to RN and Post -op Vital signs reviewed and stable  Post vital signs: Reviewed and stable  Last Vitals:  Vitals:   11/02/16 1046 11/02/16 1536  BP: (!) 145/82 (!) 143/69  Pulse: (!) 101 81  Resp: 16 18  Temp: (!) 35.6 C (!) 36 C  SpO2: 100% 100%    Last Pain:  Vitals:   11/02/16 1536  TempSrc:   PainSc: Asleep         Complications: No apparent anesthesia complications

## 2016-11-02 NOTE — Anesthesia Procedure Notes (Signed)
Procedure Name: Intubation Date/Time: 11/02/2016 1:10 PM Performed by: Hedda Slade Pre-anesthesia Checklist: Patient identified, Patient being monitored, Timeout performed, Emergency Drugs available and Suction available Patient Re-evaluated:Patient Re-evaluated prior to induction Oxygen Delivery Method: Circle system utilized Preoxygenation: Pre-oxygenation with 100% oxygen Induction Type: IV induction Ventilation: Mask ventilation without difficulty and Oral airway inserted - appropriate to patient size Laryngoscope Size: Mac and 3 Grade View: Grade I Tube type: Oral Tube size: 7.0 mm Number of attempts: 1 Airway Equipment and Method: Stylet Placement Confirmation: ETT inserted through vocal cords under direct vision,  positive ETCO2 and breath sounds checked- equal and bilateral Secured at: 21 cm Tube secured with: Tape Dental Injury: Teeth and Oropharynx as per pre-operative assessment

## 2016-11-02 NOTE — Anesthesia Post-op Follow-up Note (Signed)
Anesthesia QCDR form completed.        

## 2016-11-02 NOTE — H&P (View-Only) (Signed)
10/28/2016 2:51 PM   Courtney [name] Nov 13, 1940 [id number]  Referring provider: Rusty Aus, MD [facility] [place], [place] [address]  Chief Complaint  Patient presents with  . Hydronephrosis    HPI: [name] with history of progressive metastatic GIST dx 02/2014 Found to have worsening renal function who ultimately underwent renal ultrasound showing a large 5 cm mass within the bladder with concern for invasion of the trigone with moderate hydroureteronephrosis. She presents to urology today for further evaluation an urgent basis.  Her creatinine has risen slightly from baseline of about 0.75 last year up to 1.04 in 4/18, 3.16 and 7/18 and 1.84 and 10/27/16.  She is currently being treated for presumed UTI with nitrofurantoin since yesterday.  No culture sent.    She is somewhat of a difficult historian today. She does give a history of ultimately being diagnosed with the abdominal pelvic tumor after palpating it herself several years ago. She continues to be very independent and has excellent functional status.  She denies a personal history of smoking.   She is currently on Sutent for her GIST.  She has no personal history of heart attack or stroke. She is not on any anticoagulation.   PMH: Past Medical History:  Diagnosis Date  . Anemia   . Anxiety   . Arthritis   . Cancer [facility])    GIST  . Depression   . GERD (gastroesophageal reflux disease)   . GIST (gastrointestinal stroma tumor), malignant, colon ([place])   . History of hiatal hernia   . History of kidney stones   . Hypertension    NO MEDS NOW  . IBS (irritable bowel syndrome)   . Tremors of nervous system    HEAD    Surgical History: Past Surgical History:  Procedure Laterality Date  . ABDOMINAL HYSTERECTOMY    . BREAST BIOPSY Right   . CATARACT EXTRACTION W/PHACO Right 01/28/2015   Procedure: CATARACT EXTRACTION PHACO AND INTRAOCULAR LENS PLACEMENT (IOC);  Surgeon:  Estill Cotta, MD;  Location: [facility] ORS;  Service: Ophthalmology;  Laterality: Right;  [name] 00:59.3 AP% 22.6 CDE 24.69 fluid pack lot #[id number]  . CATARACT EXTRACTION W/PHACO Left 02/11/2015   Procedure: CATARACT EXTRACTION PHACO AND INTRAOCULAR LENS PLACEMENT (IOC);  Surgeon: Estill Cotta, MD;  Location: [facility] ORS;  Service: Ophthalmology;  Laterality: Left;  [name] 01:18 AP% 22.8 CDE 32.38  fluid pack lot # [id number]  . CHOLECYSTECTOMY    . COLON SURGERY     COLOSTOMY AND REVERSAL  . STOMACH SURGERY     CANCER    Home Medications:  Allergies as of 10/28/2016      Reactions   Ciprofloxacin Other (See Comments)   GI distress, abd cramping   Other Swelling   Magic mouthwash   Penicillins Swelling   HIVES      Medication List       Accurate as of 10/28/16 11:59 PM. Always use your most recent med list.          aspirin-sod bicarb-citric acid 325 MG Tbef tablet Commonly known as:  ALKA-SELTZER Take 325 mg by mouth every 6 (six) hours as needed (indigestion).   diphenoxylate-atropine 2.5-0.025 MG tablet Commonly known as:  LOMOTIL TAKE 1 TABLET BY MOUTH EVERY 8 HOURS AS NEEDED FOR DIARRHEA   IMODIUM A-D PO Take 1 tablet by mouth as needed.   lisinopril 10 MG tablet Commonly known as:  PRINIVIL Take 1 tablet (10 mg total) by  mouth daily.   megestrol 40 MG tablet Commonly known as:  MEGACE daily three days a week   mometasone 50 MCG/ACT nasal spray Commonly known as:  NASONEX Place 2 sprays into the nose daily as needed (sinus drainage).   multivitamin tablet Take 1 tablet by mouth daily.   nitrofurantoin (macrocrystal-monohydrate) 100 MG capsule Commonly known as:  MACROBID Take 100 mg by mouth daily.   potassium chloride SA 20 MEQ tablet Commonly known as:  K-DUR,KLOR-CON Take 1 tablet (20 mEq total) by mouth daily.   RA VITAMIN B-12 TR 1000 MCG Tbcr Generic drug:  Cyanocobalamin Take 2 tablets by mouth daily.   ranitidine 150 MG tablet Commonly known  as:  ZANTAC Take 150 mg by mouth 2 (two) times daily.   SUNItinib 25 MG capsule Commonly known as:  SUTENT Take 1 capsule by mouth daily for 14 days then 7 days off.   VITAMIN D3 ADULT GUMMIES 1000 units Chew Generic drug:  Cholecalciferol Chew 1 each by mouth daily.   VITRON-C 65-125 MG Tabs Generic drug:  Iron-Vitamin C Take 2 tablets by mouth daily.   ZYRTEC PO Take 1 tablet by mouth daily.       Allergies:  Allergies  Allergen Reactions  . Ciprofloxacin Other (See Comments)    GI distress, abd cramping  . Other Swelling    Magic mouthwash  . Penicillins Swelling    HIVES    Family History: Family History  Problem Relation Age of Onset  . Breast cancer Maternal Grandmother     Social History:  reports that she has never smoked. She has never used smokeless tobacco. She reports that she does not drink alcohol or use drugs.  ROS: UROLOGY Frequent Urination?: Yes Hard to postpone urination?: Yes Burning/pain with urination?: No Get up at night to urinate?: Yes Leakage of urine?: Yes Urine stream starts and stops?: Yes Trouble starting stream?: No Do you have to strain to urinate?: No Blood in urine?: No Urinary tract infection?: Yes Sexually transmitted disease?: No Injury to kidneys or bladder?: No Painful intercourse?: No Weak stream?: Yes Currently pregnant?: No Vaginal bleeding?: No Last menstrual period?: n  Gastrointestinal Nausea?: No Vomiting?: No Indigestion/heartburn?: Yes Diarrhea?: No Constipation?: No  Constitutional Fever: No Night sweats?: No Weight loss?: No Fatigue?: Yes  Skin Skin rash/lesions?: No Itching?: No  Eyes Blurred vision?: No Double vision?: No  Ears/Nose/Throat Sore throat?: No Sinus problems?: No  Hematologic/Lymphatic Swollen glands?: No Easy bruising?: No  Cardiovascular Leg swelling?: No Chest pain?: No  Respiratory Cough?: No Shortness of breath?: Yes  Endocrine Excessive thirst?:  No  Musculoskeletal Back pain?: Yes Joint pain?: No  Neurological Headaches?: No Dizziness?: No  Psychologic Depression?: No Anxiety?: No  Physical Exam: BP (!) 146/70 (BP Location: Left Arm, Patient Position: Sitting, Cuff Size: Normal)   Pulse (!) 106   Ht 5\' 4"  (1.626 m)   Wt 128 lb 14.4 oz (58.5 kg)   BMI 22.13 kg/m   Constitutional:  Alert and oriented, No acute distress. HEENT: [place] AT, moist mucus membranes.  Trachea midline, no masses. Cardiovascular: No clubbing, cyanosis, or edema. Respiratory: Normal respiratory effort, no increased work of breathing. GI: Abdomen is soft, nontender, nondistended, no abdominal masses GU: Normal external genitalia. Normal urethral meatus. Skin: No rashes, bruises or suspicious lesions. Neurologic: Grossly intact, no focal deficits, moving all 4 extremities. Psychiatric: Somewhat tangential.  Laboratory Data: Lab Results  Component Value Date   WBC 4.6 10/27/2016   HGB 8.0 (L)  10/27/2016   HCT 23.3 (L) 10/27/2016   MCV 109.8 (H) 10/27/2016   PLT 355 10/27/2016    Lab Results  Component Value Date   CREATININE 1.84 (H) 10/27/2016    Urinalysis Results for orders placed or performed in visit on 10/28/16  Microscopic Examination  Result Value Ref Range   WBC, UA 11-30 (A) 0 - 5 /hpf   RBC, UA None seen 0 - 2 /hpf   Epithelial Cells (non renal) None seen 0 - 10 /hpf   Mucus, UA Present (A) Not Estab.   Bacteria, UA Few (A) None seen/Few  CULTURE, URINE COMPREHENSIVE  Result Value Ref Range   Urine Culture, Comprehensive Preliminary report    Organism ID, Bacteria Comment   Urinalysis, Complete  Result Value Ref Range   Specific Gravity, UA 1.010 1.005 - 1.030   pH, UA 5.5 5.0 - 7.5   Color, UA Yellow Yellow   Appearance Ur Clear Clear   Leukocytes, UA 1+ (A) Negative   Protein, UA 1+ (A) Negative/Trace   Glucose, UA Negative Negative   Ketones, UA Negative Negative   RBC, UA 1+ (A) Negative   Bilirubin, UA  Negative Negative   Urobilinogen, Ur 0.2 0.2 - 1.0 mg/dL   Nitrite, UA Negative Negative   Microscopic Examination See below:     Pertinent Imaging: CLINICAL DATA:  Acute renal failure  EXAM: RENAL / URINARY TRACT ULTRASOUND COMPLETE  COMPARISON:  None.  FINDINGS: Right Kidney:  Length: 10.9 cm. Echogenicity and renal cortical thickness are within normal limits. No mass or perinephric fluid visualized. There is moderate hydronephrosis on the right. No sonographically demonstrable calculus or ureterectasis.  Left Kidney:  Length: 9.5 cm. Echogenicity and renal cortical thickness are within normal limits. No mass or perinephric fluid visualized. There is moderate hydronephrosis on the left. No sonographically demonstrable calculus or ureterectasis.  Bladder:  There is a hypoechoic lesion along the posterior aspect of the urinary bladder which shows moderate vascularity measuring 4.6 x 2.0 cm. This mass may be infiltrating the posterior bladder wall. Note that there is slight postvoid residual in the urinary bladder.  IMPRESSION: Hypoechoic apparent mass along the posterior aspect of the urinary bladder which may be infiltrating into the posterior bladder wall. There is moderate hydronephrosis bilaterally, possibly due to a degree of obstruction of the ureterovesical junctions from the apparent bladder tumor. There is mild postvoid residual in the urinary bladder.  These results will be called to the ordering clinician or representative by the Radiologist Assistant, and communication documented in the PACS or zVision Dashboard.   Electronically Signed   By: Lowella Grip III M.D.   On: 10/27/2016 14:45  RUS personally reviewed today  Assessment & Plan:    1. Hydronephrosis, unspecified hydronephrosis type Secondary to obstructing mass in bladder vs. Pelvis mass - Urinalysis, Complete - lidocaine (XYLOCAINE) 2 % jelly 1 application; Place 1  application into the urethra once.  2. Malignant neoplasm of urinary bladder, unspecified site [facility]) Cysto confirms presence of large bladder mass, etiology unclear, differential includes TCC vs. GIST I recommended proceeding to the operating room urgently for TURBT, possible bilateral retrograde pyelogram, possible ureteral stent placement. Risk of bleeding, infection, damage is running structures, need for Foley catheter, bladder perforation, failure to place stents were all discussed. If we're unable to place stents, she will need to be admitted for bilateral percutaneous approximately 2 placement. All of her questions were answered. - CULTURE, URINE COMPREHENSIVE  3. Acute renal failure,  unspecified acute renal failure type ([place]) Secondary to bilateral renal obstruction from obstructing mass We'll expedite intervention, if stents unable to be placed from below, we'll admit for perc tube placement Signs and symptoms of progressive renal failure were discussed including warning symptoms, advised to present to emergency room if she has any concerns prior to surgery  Case discussed with Dr. Baird Kay, [place] [address], [place], [place] [address] [phone]

## 2016-11-02 NOTE — OR Nursing (Signed)
Patient is unsure if she truly had SOB with Cipro,  Discussed this with Dr Erlene Quan, allergies reviewed by Dr Erlene Quan, Dr Erlene Quan wants to proceed with giving Cipro pre-op.

## 2016-11-02 NOTE — Anesthesia Postprocedure Evaluation (Signed)
Anesthesia Post Note  Patient: Ashari Llewellyn Ringstad  Procedure(s) Performed: Procedure(s) (LRB): TRANSURETHRAL RESECTION OF BLADDER TUMOR (TURBT) >5CM (N/A) CYSTOSCOPY WITH RETROGRADE PYELOGRAM (Bilateral) CYSTOSCOPY WITH STENT PLACEMENT (Bilateral)  Patient location during evaluation: PACU Anesthesia Type: General Level of consciousness: awake and alert Pain management: pain level controlled Vital Signs Assessment: post-procedure vital signs reviewed and stable Respiratory status: spontaneous breathing and respiratory function stable Cardiovascular status: stable Anesthetic complications: no     Last Vitals:  Vitals:   11/02/16 1549 11/02/16 1555  BP:  (!) 159/79  Pulse: 86 79  Resp: (!) 21 15  Temp:    SpO2: 97% 99%    Last Pain:  Vitals:   11/02/16 1549  TempSrc:   PainSc: 0-No pain                 KEPHART,WILLIAM K

## 2016-11-03 ENCOUNTER — Ambulatory Visit: Payer: PPO | Admitting: Internal Medicine

## 2016-11-04 ENCOUNTER — Other Ambulatory Visit: Payer: Self-pay | Admitting: Anatomic Pathology & Clinical Pathology

## 2016-11-04 DIAGNOSIS — M5430 Sciatica, unspecified side: Secondary | ICD-10-CM | POA: Diagnosis not present

## 2016-11-04 DIAGNOSIS — D500 Iron deficiency anemia secondary to blood loss (chronic): Secondary | ICD-10-CM | POA: Diagnosis not present

## 2016-11-04 DIAGNOSIS — N170 Acute kidney failure with tubular necrosis: Secondary | ICD-10-CM | POA: Diagnosis not present

## 2016-11-04 DIAGNOSIS — N1330 Unspecified hydronephrosis: Secondary | ICD-10-CM | POA: Diagnosis not present

## 2016-11-07 ENCOUNTER — Emergency Department: Payer: PPO

## 2016-11-07 ENCOUNTER — Inpatient Hospital Stay
Admission: EM | Admit: 2016-11-07 | Discharge: 2016-11-18 | DRG: 669 | Disposition: A | Discharge status: Home Health | Payer: PPO | Attending: Internal Medicine | Admitting: Internal Medicine

## 2016-11-07 DIAGNOSIS — E440 Moderate protein-calorie malnutrition: Secondary | ICD-10-CM | POA: Insufficient documentation

## 2016-11-07 DIAGNOSIS — R4182 Altered mental status, unspecified: Secondary | ICD-10-CM | POA: Diagnosis not present

## 2016-11-07 DIAGNOSIS — F419 Anxiety disorder, unspecified: Secondary | ICD-10-CM | POA: Diagnosis not present

## 2016-11-07 DIAGNOSIS — R410 Disorientation, unspecified: Secondary | ICD-10-CM | POA: Diagnosis not present

## 2016-11-07 DIAGNOSIS — C679 Malignant neoplasm of bladder, unspecified: Principal | ICD-10-CM | POA: Diagnosis present

## 2016-11-07 DIAGNOSIS — N132 Hydronephrosis with renal and ureteral calculous obstruction: Secondary | ICD-10-CM | POA: Diagnosis not present

## 2016-11-07 DIAGNOSIS — Z960 Presence of urogenital implants: Secondary | ICD-10-CM | POA: Diagnosis present

## 2016-11-07 DIAGNOSIS — N029 Recurrent and persistent hematuria with unspecified morphologic changes: Secondary | ICD-10-CM | POA: Diagnosis present

## 2016-11-07 DIAGNOSIS — Z880 Allergy status to penicillin: Secondary | ICD-10-CM

## 2016-11-07 DIAGNOSIS — R109 Unspecified abdominal pain: Secondary | ICD-10-CM | POA: Diagnosis not present

## 2016-11-07 DIAGNOSIS — D62 Acute posthemorrhagic anemia: Secondary | ICD-10-CM

## 2016-11-07 DIAGNOSIS — Z79899 Other long term (current) drug therapy: Secondary | ICD-10-CM

## 2016-11-07 DIAGNOSIS — E876 Hypokalemia: Secondary | ICD-10-CM | POA: Diagnosis not present

## 2016-11-07 DIAGNOSIS — Z803 Family history of malignant neoplasm of breast: Secondary | ICD-10-CM

## 2016-11-07 DIAGNOSIS — C786 Secondary malignant neoplasm of retroperitoneum and peritoneum: Secondary | ICD-10-CM | POA: Diagnosis not present

## 2016-11-07 DIAGNOSIS — R251 Tremor, unspecified: Secondary | ICD-10-CM | POA: Diagnosis not present

## 2016-11-07 DIAGNOSIS — I82402 Acute embolism and thrombosis of unspecified deep veins of left lower extremity: Secondary | ICD-10-CM | POA: Diagnosis present

## 2016-11-07 DIAGNOSIS — Z8500 Personal history of malignant neoplasm of unspecified digestive organ: Secondary | ICD-10-CM

## 2016-11-07 DIAGNOSIS — F329 Major depressive disorder, single episode, unspecified: Secondary | ICD-10-CM | POA: Diagnosis present

## 2016-11-07 DIAGNOSIS — Z888 Allergy status to other drugs, medicaments and biological substances status: Secondary | ICD-10-CM

## 2016-11-07 DIAGNOSIS — N3289 Other specified disorders of bladder: Secondary | ICD-10-CM | POA: Diagnosis not present

## 2016-11-07 DIAGNOSIS — N179 Acute kidney failure, unspecified: Secondary | ICD-10-CM | POA: Diagnosis present

## 2016-11-07 DIAGNOSIS — Z955 Presence of coronary angioplasty implant and graft: Secondary | ICD-10-CM

## 2016-11-07 DIAGNOSIS — R319 Hematuria, unspecified: Secondary | ICD-10-CM

## 2016-11-07 DIAGNOSIS — I10 Essential (primary) hypertension: Secondary | ICD-10-CM | POA: Diagnosis not present

## 2016-11-07 DIAGNOSIS — Z90710 Acquired absence of both cervix and uterus: Secondary | ICD-10-CM

## 2016-11-07 DIAGNOSIS — Z9889 Other specified postprocedural states: Secondary | ICD-10-CM | POA: Diagnosis not present

## 2016-11-07 DIAGNOSIS — Z8249 Family history of ischemic heart disease and other diseases of the circulatory system: Secondary | ICD-10-CM

## 2016-11-07 DIAGNOSIS — R590 Localized enlarged lymph nodes: Secondary | ICD-10-CM | POA: Diagnosis not present

## 2016-11-07 DIAGNOSIS — N1330 Unspecified hydronephrosis: Secondary | ICD-10-CM

## 2016-11-07 DIAGNOSIS — Z9221 Personal history of antineoplastic chemotherapy: Secondary | ICD-10-CM | POA: Diagnosis not present

## 2016-11-07 DIAGNOSIS — W010XXA Fall on same level from slipping, tripping and stumbling without subsequent striking against object, initial encounter: Secondary | ICD-10-CM | POA: Diagnosis not present

## 2016-11-07 DIAGNOSIS — Z87442 Personal history of urinary calculi: Secondary | ICD-10-CM

## 2016-11-07 DIAGNOSIS — R591 Generalized enlarged lymph nodes: Secondary | ICD-10-CM

## 2016-11-07 DIAGNOSIS — E875 Hyperkalemia: Secondary | ICD-10-CM | POA: Diagnosis present

## 2016-11-07 DIAGNOSIS — R310 Gross hematuria: Secondary | ICD-10-CM

## 2016-11-07 DIAGNOSIS — Z7901 Long term (current) use of anticoagulants: Secondary | ICD-10-CM | POA: Diagnosis not present

## 2016-11-07 DIAGNOSIS — Z8509 Personal history of malignant neoplasm of other digestive organs: Secondary | ICD-10-CM | POA: Diagnosis not present

## 2016-11-07 DIAGNOSIS — I82409 Acute embolism and thrombosis of unspecified deep veins of unspecified lower extremity: Secondary | ICD-10-CM | POA: Diagnosis not present

## 2016-11-07 DIAGNOSIS — N1339 Other hydronephrosis: Secondary | ICD-10-CM

## 2016-11-07 DIAGNOSIS — K589 Irritable bowel syndrome without diarrhea: Secondary | ICD-10-CM | POA: Diagnosis present

## 2016-11-07 DIAGNOSIS — S3722XA Contusion of bladder, initial encounter: Secondary | ICD-10-CM | POA: Diagnosis not present

## 2016-11-07 DIAGNOSIS — Z436 Encounter for attention to other artificial openings of urinary tract: Secondary | ICD-10-CM | POA: Diagnosis not present

## 2016-11-07 DIAGNOSIS — K449 Diaphragmatic hernia without obstruction or gangrene: Secondary | ICD-10-CM | POA: Diagnosis not present

## 2016-11-07 DIAGNOSIS — N136 Pyonephrosis: Secondary | ICD-10-CM | POA: Diagnosis present

## 2016-11-07 DIAGNOSIS — I82412 Acute embolism and thrombosis of left femoral vein: Secondary | ICD-10-CM | POA: Diagnosis not present

## 2016-11-07 DIAGNOSIS — K219 Gastro-esophageal reflux disease without esophagitis: Secondary | ICD-10-CM | POA: Diagnosis present

## 2016-11-07 DIAGNOSIS — Z6826 Body mass index (BMI) 26.0-26.9, adult: Secondary | ICD-10-CM | POA: Diagnosis not present

## 2016-11-07 DIAGNOSIS — Z938 Other artificial opening status: Secondary | ICD-10-CM | POA: Diagnosis not present

## 2016-11-07 DIAGNOSIS — C49A4 Gastrointestinal stromal tumor of large intestine: Secondary | ICD-10-CM | POA: Diagnosis not present

## 2016-11-07 DIAGNOSIS — Z961 Presence of intraocular lens: Secondary | ICD-10-CM | POA: Diagnosis present

## 2016-11-07 DIAGNOSIS — M1990 Unspecified osteoarthritis, unspecified site: Secondary | ICD-10-CM | POA: Diagnosis not present

## 2016-11-07 DIAGNOSIS — R5381 Other malaise: Secondary | ICD-10-CM | POA: Diagnosis present

## 2016-11-07 DIAGNOSIS — D500 Iron deficiency anemia secondary to blood loss (chronic): Secondary | ICD-10-CM | POA: Diagnosis not present

## 2016-11-07 DIAGNOSIS — N200 Calculus of kidney: Secondary | ICD-10-CM | POA: Diagnosis not present

## 2016-11-07 DIAGNOSIS — Z881 Allergy status to other antibiotic agents status: Secondary | ICD-10-CM

## 2016-11-07 DIAGNOSIS — I82492 Acute embolism and thrombosis of other specified deep vein of left lower extremity: Secondary | ICD-10-CM | POA: Diagnosis not present

## 2016-11-07 DIAGNOSIS — M79605 Pain in left leg: Secondary | ICD-10-CM | POA: Diagnosis not present

## 2016-11-07 DIAGNOSIS — D494 Neoplasm of unspecified behavior of bladder: Secondary | ICD-10-CM | POA: Diagnosis not present

## 2016-11-07 DIAGNOSIS — D649 Anemia, unspecified: Secondary | ICD-10-CM | POA: Diagnosis not present

## 2016-11-07 DIAGNOSIS — B958 Unspecified staphylococcus as the cause of diseases classified elsewhere: Secondary | ICD-10-CM | POA: Diagnosis present

## 2016-11-07 DIAGNOSIS — Z9049 Acquired absence of other specified parts of digestive tract: Secondary | ICD-10-CM

## 2016-11-07 LAB — COMPREHENSIVE METABOLIC PANEL
ALBUMIN: 3.3 g/dL — AB (ref 3.5–5.0)
ALK PHOS: 49 U/L (ref 38–126)
ALT: 27 U/L (ref 14–54)
ANION GAP: 10 (ref 5–15)
AST: 27 U/L (ref 15–41)
BILIRUBIN TOTAL: 0.8 mg/dL (ref 0.3–1.2)
BUN: 15 mg/dL (ref 6–20)
CALCIUM: 8.4 mg/dL — AB (ref 8.9–10.3)
CO2: 24 mmol/L (ref 22–32)
Chloride: 101 mmol/L (ref 101–111)
Creatinine, Ser: 1.07 mg/dL — ABNORMAL HIGH (ref 0.44–1.00)
GFR calc Af Amer: 57 mL/min — ABNORMAL LOW (ref >60)
GFR, EST NON AFRICAN AMERICAN: 49 mL/min — AB (ref >60)
GLUCOSE: 100 mg/dL — AB (ref 65–99)
POTASSIUM: 3.1 mmol/L — AB (ref 3.5–5.1)
Sodium: 135 mmol/L (ref 135–145)
TOTAL PROTEIN: 6.1 g/dL — AB (ref 6.5–8.1)

## 2016-11-07 LAB — URINALYSIS, COMPLETE (UACMP) WITH MICROSCOPIC
BILIRUBIN URINE: NEGATIVE
GLUCOSE, UA: NEGATIVE mg/dL
KETONES UR: NEGATIVE mg/dL
NITRITE: NEGATIVE
PH: 7 (ref 5.0–8.0)
Protein, ur: 100 mg/dL — AB
SPECIFIC GRAVITY, URINE: 1.003 — AB (ref 1.005–1.030)

## 2016-11-07 LAB — CBC WITH DIFFERENTIAL/PLATELET
Basophils Absolute: 0 10*3/uL (ref 0–0.1)
Basophils Relative: 0 %
Eosinophils Absolute: 0 10*3/uL (ref 0–0.7)
Eosinophils Relative: 0 %
HEMATOCRIT: 23.6 % — AB (ref 35.0–47.0)
HEMOGLOBIN: 8.4 g/dL — AB (ref 12.0–16.0)
LYMPHS ABS: 0.7 10*3/uL — AB (ref 1.0–3.6)
Lymphocytes Relative: 9 %
MCH: 37.3 pg — AB (ref 26.0–34.0)
MCHC: 35.8 g/dL (ref 32.0–36.0)
MCV: 104.3 fL — ABNORMAL HIGH (ref 80.0–100.0)
MONOS PCT: 10 %
Monocytes Absolute: 0.8 10*3/uL (ref 0.2–0.9)
Neutro Abs: 6.3 10*3/uL (ref 1.4–6.5)
Neutrophils Relative %: 81 %
Platelets: 399 10*3/uL (ref 150–440)
RBC: 2.26 MIL/uL — AB (ref 3.80–5.20)
RDW: 12.8 % (ref 11.5–14.5)
WBC: 7.8 10*3/uL (ref 3.6–11.0)

## 2016-11-07 LAB — MAGNESIUM: MAGNESIUM: 1.3 mg/dL — AB (ref 1.7–2.4)

## 2016-11-07 LAB — FERRITIN: FERRITIN: 60 ng/mL (ref 11–307)

## 2016-11-07 MED ORDER — MORPHINE SULFATE (PF) 2 MG/ML IV SOLN
2.0000 mg | Freq: Once | INTRAVENOUS | Status: DC
  Filled 2016-11-07: qty 1

## 2016-11-07 MED ORDER — MORPHINE SULFATE (PF) 2 MG/ML IV SOLN
2.0000 mg | Freq: Once | INTRAVENOUS | Status: AC
  Administered 2016-11-07: 2 mg via INTRAVENOUS

## 2016-11-07 MED ORDER — POTASSIUM CHLORIDE CRYS ER 20 MEQ PO TBCR: 20.0000 meq | EXTENDED_RELEASE_TABLET | Freq: Every day | ORAL | Status: DC

## 2016-11-07 MED ORDER — ACETAMINOPHEN 650 MG RE SUPP: 650.0000 mg | Freq: Four times a day (QID) | RECTAL | Status: DC | PRN

## 2016-11-07 MED ORDER — OXYCODONE HCL 5 MG PO TABS
5.0000 mg | ORAL_TABLET | Freq: Four times a day (QID) | ORAL | Status: DC | PRN
  Administered 2016-11-07 – 2016-11-18 (×19): 5 mg via ORAL
  Filled 2016-11-07 (×20): qty 1

## 2016-11-07 MED ORDER — FERROUS SULFATE 325 (65 FE) MG PO TABS
325.0000 mg | ORAL_TABLET | Freq: Three times a day (TID) | ORAL | Status: DC
  Administered 2016-11-08 – 2016-11-18 (×29): 325 mg via ORAL
  Filled 2016-11-07 (×30): qty 1

## 2016-11-07 MED ORDER — VITAMIN D 1000 UNITS PO TABS
1000.0000 [IU] | ORAL_TABLET | Freq: Every day | ORAL | Status: DC
  Administered 2016-11-08 – 2016-11-18 (×9): 1000 [IU] via ORAL
  Filled 2016-11-07 (×16): qty 1

## 2016-11-07 MED ORDER — ONDANSETRON HCL 4 MG/2ML IJ SOLN
INTRAMUSCULAR | Status: AC
  Administered 2016-11-07: 4 mg via INTRAVENOUS
  Filled 2016-11-07: qty 2

## 2016-11-07 MED ORDER — MORPHINE SULFATE (PF) 2 MG/ML IV SOLN
INTRAVENOUS | Status: AC
  Administered 2016-11-07: 2 mg via INTRAVENOUS
  Filled 2016-11-07: qty 1

## 2016-11-07 MED ORDER — SODIUM CHLORIDE 0.9 % IV BOLUS (SEPSIS)
1000.0000 mL | Freq: Once | INTRAVENOUS | Status: AC
  Administered 2016-11-07: 1000 mL via INTRAVENOUS

## 2016-11-07 MED ORDER — APIXABAN 5 MG PO TABS
10.0000 mg | ORAL_TABLET | Freq: Two times a day (BID) | ORAL | Status: DC
  Administered 2016-11-07 – 2016-11-08 (×2): 10 mg via ORAL
  Filled 2016-11-07 (×2): qty 2

## 2016-11-07 MED ORDER — ONDANSETRON HCL 4 MG/2ML IJ SOLN
4.0000 mg | Freq: Once | INTRAMUSCULAR | Status: AC
  Administered 2016-11-07: 4 mg via INTRAVENOUS

## 2016-11-07 MED ORDER — ADULT MULTIVITAMIN W/MINERALS CH
1.0000 | ORAL_TABLET | Freq: Every day | ORAL | Status: DC
  Administered 2016-11-08 – 2016-11-18 (×9): 1 via ORAL
  Filled 2016-11-07 (×10): qty 1

## 2016-11-07 MED ORDER — IRON-VITAMIN C 65-125 MG PO TABS: 2.0000 | ORAL_TABLET | Freq: Every day | ORAL | Status: DC

## 2016-11-07 MED ORDER — IOPAMIDOL (ISOVUE-300) INJECTION 61%
30.0000 mL | Freq: Once | INTRAVENOUS | Status: AC | PRN
  Administered 2016-11-07: 30 mL via ORAL

## 2016-11-07 MED ORDER — VITAMIN B-12 1000 MCG PO TABS
1000.0000 ug | ORAL_TABLET | Freq: Every day | ORAL | Status: DC
  Administered 2016-11-08 – 2016-11-18 (×9): 1000 ug via ORAL
  Filled 2016-11-07 (×2): qty 2
  Filled 2016-11-07: qty 1
  Filled 2016-11-07 (×6): qty 2
  Filled 2016-11-07: qty 1
  Filled 2016-11-07 (×2): qty 2

## 2016-11-07 MED ORDER — IOPAMIDOL (ISOVUE-300) INJECTION 61%
80.0000 mL | Freq: Once | INTRAVENOUS | Status: AC | PRN
  Administered 2016-11-07: 80 mL via INTRAVENOUS

## 2016-11-07 MED ORDER — OCUVITE-LUTEIN PO CAPS
2.0000 | ORAL_CAPSULE | Freq: Every day | ORAL | Status: DC
  Administered 2016-11-08 – 2016-11-18 (×9): 2 via ORAL
  Filled 2016-11-07 (×12): qty 2

## 2016-11-07 MED ORDER — ACETAMINOPHEN 325 MG PO TABS
650.0000 mg | ORAL_TABLET | Freq: Four times a day (QID) | ORAL | Status: DC | PRN
  Administered 2016-11-10 – 2016-11-13 (×2): 650 mg via ORAL
  Filled 2016-11-07 (×2): qty 2

## 2016-11-07 MED ORDER — APIXABAN 5 MG PO TABS: 5.0000 mg | ORAL_TABLET | Freq: Two times a day (BID) | ORAL | Status: DC

## 2016-11-07 MED ORDER — POTASSIUM CHLORIDE CRYS ER 20 MEQ PO TBCR
20.0000 meq | EXTENDED_RELEASE_TABLET | Freq: Every day | ORAL | Status: DC
  Administered 2016-11-07 – 2016-11-12 (×6): 20 meq via ORAL
  Filled 2016-11-07 (×6): qty 1

## 2016-11-07 MED ORDER — POTASSIUM CHLORIDE CRYS ER 20 MEQ PO TBCR
40.0000 meq | EXTENDED_RELEASE_TABLET | Freq: Once | ORAL | Status: AC
  Administered 2016-11-07: 40 meq via ORAL
  Filled 2016-11-07: qty 2

## 2016-11-07 NOTE — ED Notes (Signed)
To [name] via stretcher.  AAOx3.  Skin warm and dry.  NAD

## 2016-11-07 NOTE — ED Notes (Signed)
To CT Scan via stretcher.

## 2016-11-07 NOTE — ED Notes (Signed)
AAOx3.  Skin warm and dry.  NAD 

## 2016-11-07 NOTE — ED Triage Notes (Signed)
Patient to ER for "aching all over". Patient states "This epidural I had placed in my left leg two years ago is giving me a fit. I have pain in my leg, in my back, and in my hip.". Patient arrives with foley catheter in place.

## 2016-11-07 NOTE — ED Provider Notes (Signed)
IMPRESSION: 1. Confirmed left lower extremity DVT, nonocclusive in the common femoral vein and occlusive in the posterior tibial vein. 2. 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   Electronically Signed   By: Monte Fantasia M.D.   On: 11/07/2016 16:10  Dr. Reita Cliche have discussed this with the urologist. The urologist said if it was a DVT present and the patient needed anticoagulation need to be anticoagulated hospital given her recent surgery. I will notify the hospitalist.   Nena Polio, MD 11/07/16 [phone]

## 2016-11-07 NOTE — ED Triage Notes (Signed)
Left leg pain, hx of left leg pain. Epidural in that leg X 2 years ago. Takes gabapentin for pain. Ortho appt next week.

## 2016-11-07 NOTE — ED Provider Notes (Signed)
[facility] Emergency Department Provider Note ____________________________________________   I have reviewed the triage vital signs and the triage nursing note.  HISTORY  Chief Complaint Leg Pain   Historian Patient, daughter, and friends at the bedside  HPI Courtney [name] is a 76 y.o. female presents for left-sided abdominal pain and left leg pain as well as left back pain, for several days since she had bladder cancer surgery, transurethral with urology on Monday.  States that she has had some chronic back pain for several years since an epidural, but does not typically take anything specific for it other than a small dose, 100 mg of gabapentin.  She has an indwelling Foley catheter placed at the procedure/surgery on Monday. She has had still some small amount of bloody urine.  No fever reported.      Past Medical History:  Diagnosis Date  . Anemia   . Anxiety   . Arthritis   . 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 Depression   . GERD (gastroesophageal reflux disease)   . 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 History of hiatal hernia   . History of kidney stones   . Hypertension    NO MEDS NOW  . IBS (irritable bowel syndrome)   . Tremors of nervous system    HEAD    Patient Active Problem List   Diagnosis Date Noted  . Acute renal failure ([place]) 10/27/2016  . ARF (acute renal failure) with tubular necrosis ([place]) 10/27/2016  . Diverticulitis 05/11/2014  . 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 Malignant neoplasm of gastrointestinal tract ([place]) 04/04/2014  . DDD (degenerative disc disease), cervical 11/14/2013  . Acid reflux 11/14/2013  . HLD (hyperlipidemia) 11/14/2013    Past Surgical History:  Procedure Laterality Date  . ABDOMINAL HYSTERECTOMY    . BREAST BIOPSY Right   . CATARACT EXTRACTION W/PHACO Right 01/28/2015   Procedure: CATARACT EXTRACTION PHACO AND INTRAOCULAR LENS PLACEMENT (IOC);  Surgeon:  Estill Cotta, MD;  Location: [facility] ORS;  Service: Ophthalmology;  Laterality: Right;  [name] 00:59.3 AP% 22.6 CDE 24.69 fluid pack lot #[id number]  . CATARACT EXTRACTION W/PHACO Left 02/11/2015   Procedure: CATARACT EXTRACTION PHACO AND INTRAOCULAR LENS PLACEMENT (IOC);  Surgeon: Estill Cotta, MD;  Location: [facility] ORS;  Service: Ophthalmology;  Laterality: Left;  [name] 01:18 AP% 22.8 CDE 32.38  fluid pack lot # [id number]  . CHOLECYSTECTOMY    . COLON SURGERY     COLOSTOMY AND REVERSAL  . CYSTOSCOPY W/ RETROGRADES Bilateral 11/02/2016   Procedure: CYSTOSCOPY WITH RETROGRADE PYELOGRAM;  Surgeon: Hollice Espy, MD;  Location: [facility] ORS;  Service: Urology;  Laterality: Bilateral;  . CYSTOSCOPY WITH STENT PLACEMENT Bilateral 11/02/2016   Procedure: CYSTOSCOPY WITH STENT PLACEMENT;  Surgeon: Hollice Espy, MD;  Location: [facility] ORS;  Service: Urology;  Laterality: Bilateral;  . STOMACH SURGERY     CANCER  . TRANSURETHRAL RESECTION OF BLADDER TUMOR N/A 11/02/2016   Procedure: TRANSURETHRAL RESECTION OF BLADDER TUMOR (TURBT) >5CM;  Surgeon: Hollice Espy, MD;  Location: [facility] ORS;  Service: Urology;  Laterality: N/A;    Prior to Admission medications   Medication Sig Start Date End Date Taking? Authorizing Provider  Cetirizine HCl (ZYRTEC PO) Take 1 tablet by mouth daily.    Yes [provider]  Cholecalciferol (VITAMIN D3 ADULT GUMMIES) 1000 units CHEW Chew 1 each by mouth daily.   Yes [provider]  Cyanocobalamin (RA VITAMIN B-12 TR) 1000 MCG TBCR Take 2 tablets  by mouth daily.    Yes [provider]  Iron-Vitamin C (VITRON-C) 65-125 MG TABS Take 2 tablets by mouth daily.    Yes [provider]  megestrol (MEGACE) 40 MG tablet daily three days a week 05/13/15  Yes [provider]  Multiple Vitamin (MULTIVITAMIN) tablet Take 1 tablet by mouth daily.   Yes [provider]  Multiple Vitamins-Minerals (PRESERVISION AREDS PO) Take 2 tablets by  mouth daily.   Yes [provider]  potassium chloride SA (K-DUR,KLOR-CON) 20 MEQ tablet Take 1 tablet (20 mEq total) by mouth daily. 05/03/15  Yes Choksi, Delorise Shiner, MD  diphenoxylate-atropine (LOMOTIL) 2.5-0.025 MG tablet TAKE 1 TABLET BY MOUTH EVERY 8 HOURS AS NEEDED FOR DIARRHEA Patient not taking: Reported on 10/27/2016 09/18/16   Cammie Sickle, MD  gabapentin (NEURONTIN) 100 MG capsule Take 100 mg by mouth 3 (three) times daily.    [provider]  HYDROcodone-acetaminophen (NORCO/VICODIN) 5-325 MG tablet Take 1-2 tablets by mouth every 6 (six) hours as needed for moderate pain. Patient not taking: Reported on 11/07/2016 11/02/16   Hollice Espy, MD  lisinopril (PRINIVIL) 10 MG tablet Take 1 tablet (10 mg total) by mouth daily. Patient not taking: Reported on 10/27/2016 01/20/16   Cammie Sickle, MD  oxybutynin (DITROPAN) 5 MG tablet Take 1 tablet (5 mg total) by mouth every 8 (eight) hours as needed for bladder spasms. Patient not taking: Reported on 11/07/2016 11/02/16   Hollice Espy, MD  SUNItinib (SUTENT) 25 MG capsule Take 1 capsule by mouth daily for 14 days then 7 days off. Patient not taking: Reported on 10/27/2016 09/01/16   Sindy Guadeloupe, MD    Allergies  Allergen Reactions  . Other Swelling    Magic mouthwash  . Penicillins Swelling    Has patient had a PCN reaction causing immediate rash, facial/tongue/throat swelling, SOB or lightheadedness with hypotension: No Has patient had a PCN reaction causing severe rash involving mucus membranes or skin necrosis: No Has patient had a PCN reaction that required hospitalization: No Has patient had a PCN reaction occurring within the last 10 years: No If all of the above answers are "NO", then may proceed with Cephalosporin use.    . Ciprofloxacin Diarrhea    GI distress, abd cramping    Family History  Problem Relation Age of Onset  . Breast cancer Maternal Grandmother     Social History Social  History  Substance Use Topics  . Smoking status: Never Smoker  . Smokeless tobacco: Never Used  . Alcohol use No    Review of Systems  Constitutional: Negative for fever. Eyes: Negative for visual changes. ENT: Negative for sore throat. Cardiovascular: Negative for chest pain. Respiratory: Negative for shortness of breath. Gastrointestinal: Negative for abdominal pain, vomiting and diarrhea. Genitourinary: Negative for dysuria. Musculoskeletal: Negative for back pain. Skin: Negative for rash. Neurological: Negative for headache.  ____________________________________________   PHYSICAL EXAM:  VITAL SIGNS: ED Triage Vitals  Enc Vitals Group     BP 11/07/16 0912 97/74     Pulse Rate 11/07/16 0912 (!) 107     Resp 11/07/16 0912 18     Temp 11/07/16 0912 98.6 F (37 C)     Temp Source 11/07/16 0912 Oral     SpO2 11/07/16 0912 99 %     Weight 11/07/16 0912 129 lb (58.5 kg)     Height 11/07/16 0912 4\' 11"  (1.499 m)     Head Circumference --  Peak Flow --      Pain Score 11/07/16 0911 10     Pain Loc --      Pain Edu? --      Excl. in [place]? --      Constitutional: Alert and oriented. Well appearing and in no distress. HEENT   Head: Normocephalic and atraumatic.      Eyes: Conjunctivae are normal. Pupils equal and round.       Ears:         Nose: No congestion/rhinnorhea.   Mouth/Throat: Mucous membranes are moist.   Neck: No stridor. Cardiovascular/Chest: Normal rate, regular rhythm.  No murmurs, rubs, or gallops. Respiratory: Normal respiratory effort without tachypnea nor retractions. Breath sounds are clear and equal bilaterally. No wheezes/rales/rhonchi. Gastrointestinal: Soft. No distention, no guarding, no rebound. Mild and left-sided abdominal pain.  Genitourinary/rectal: Indwelling Foley catheter with mild hematuria. Musculoskeletal: Nontender with normal range of motion in all extremities. No joint effusions.  No lower extremity tenderness.  No  edema. Neurologic:  Normal speech and language. No gross or focal neurologic deficits are appreciated. Skin:  Skin is warm, dry and intact. No rash noted. Psychiatric: Mood and affect are normal. Speech and behavior are normal. Patient exhibits appropriate insight and judgment.   ____________________________________________  LABS (pertinent positives/negatives)  Labs Reviewed  URINALYSIS, COMPLETE (UACMP) WITH MICROSCOPIC - Abnormal; Notable for the following:       Result Value   Color, Urine YELLOW (*)    APPearance HAZY (*)    Specific Gravity, Urine 1.003 (*)    Hgb urine dipstick MODERATE (*)    Protein, ur 100 (*)    Leukocytes, UA LARGE (*)    Bacteria, UA RARE (*)    Squamous Epithelial / LPF 0-5 (*)    All other components within normal limits  COMPREHENSIVE METABOLIC PANEL - Abnormal; Notable for the following:    Potassium 3.1 (*)    Glucose, Bld 100 (*)    Creatinine, Ser 1.07 (*)    Calcium 8.4 (*)    Total Protein 6.1 (*)    Albumin 3.3 (*)    GFR calc non Af Amer 49 (*)    GFR calc Af Amer 57 (*)    All other components within normal limits  CBC WITH DIFFERENTIAL/PLATELET - Abnormal; Notable for the following:    RBC 2.26 (*)    Hemoglobin 8.4 (*)    HCT 23.6 (*)    MCV 104.3 (*)    MCH 37.3 (*)    Lymphs Abs 0.7 (*)    All other components within normal limits  URINE CULTURE    ____________________________________________    EKG I, Lisa Roca, MD, the attending physician have personally viewed and interpreted all ECGs.  None ____________________________________________  RADIOLOGY All Xrays were viewed by me. Imaging interpreted by Radiologist.  CT abdomen and pelvis with contrast:  IMPRESSION: 1. There is bulky retroperitoneal, pelvic and left inguinal adenopathy, new since the prior CT. Nodes compress the inferior vena cava and iliac veins. There may be thrombus in the common femoral veins. Consider follow-up bilateral lower extremity  venous Doppler evaluation. 2. Bilateral ureteral stents are well positioned. There is mild to moderate right and mild left hydronephrosis. 3. Irregular wall thickening of the posterior and inferior bladder. This may be inflammatory or reflect neoplasm. 4. Moderate hiatal hernia. 5. 16 mm soft tissue nodule in the left upper quadrant anterior peritoneal cavity a reflect an enlarged lymph node or a neoplastic peritoneal deposit. 6.  Trace ascites. 7. Aortic atherosclerosis. __________________________________________  PROCEDURES  Procedure(s) performed: None  Critical Care performed: None  ____________________________________________   ED COURSE / ASSESSMENT AND PLAN  Pertinent labs & imaging results that were available during my care of the patient were reviewed by me and considered in my medical decision making (see chart for details).  Mrs. Suzan Slick had a transurethral surgery due to bladder cancer on Monday and has indwelling Foley catheter. At that time she had bilateral hydronephrosis and ureteral stents were placed.  She is complaining of abdominal pain especially into the back on the left side.  We discussed obtaining CT for further investigation.  CT shows improved hydro-, and urinary stents are in place. I discussed with the radiologist who is okay with this from a urologic perspective.  CT scan shows lymphadenopathy pressing/compressing the venous flow, and radiologist recommends a DVT study.  From the standpoint of the lymphadenopathy in the likely bladder cancer, I did refer the patient for oncology follow-up. She can have her regularly scheduled outpatient follow-up with urology this week.  Patient care transferred to Dr. Rip Harbour at shift change 3:15 PM. Ultrasound for DVT is pending. If ultrasound shows DVT, patient will need to be anticoagulated as an inpatient given the recent urologic procedure and risk for bleeding. If not, could be discharged with pain  control.    CONSULTATIONS:  Dr. Eppie Gibson, urology - recommends continued Foley catheter, no other indications of emergent urologic intervention needed at this point time, could follow up as scheduled this week. Left flank pain could be from ureteral stent. We will treat discomfort as needed either as an inpatient if blood clots/DVT are found, or outpatient.   Patient / Family / Caregiver informed of clinical course, medical decision-making process, and agree with plan.   ___________________________________________   FINAL CLINICAL IMPRESSION(S) / ED DIAGNOSES   Final diagnoses:  Lymphadenopathy  Left flank pain              Note: This dictation was prepared with Dragon dictation. Any transcriptional errors that result from this process are unintentional    Lisa Roca, MD 11/07/16 1510

## 2016-11-07 NOTE — H&P (Signed)
[place] [facility] [place] NAME: Courtney [name]    MR#:  [id number]  DATE OF BIRTH:  26-Sep-1940  DATE OF ADMISSION:  11/07/2016  PRIMARY CARE PHYSICIAN: Rusty Aus, MD   REQUESTING/REFERRING PHYSICIAN: Conni Slipper  CHIEF COMPLAINT:   Chief Complaint  Patient presents with  . Leg Pain    HISTORY OF PRESENT ILLNESS:  Courtney [name]  is a 76 y.o. female with a known history of GIST abdominal tumor and recent diagnosis of bladder tumor and patient requiring ureteral stents. Patient presented with left leg pain and itching. Pain is described as severe in nature 9 out of 10 in intensity. She's been having abdominal discomfort and sense the stent placement on Monday. In the ER, she was found to have a DVT of the left lower extremity on ultrasound. She was also found to have increasing lymphadenopathy in the abdomen on CT scan. Hospitalist services were contacted for further evaluation after the ER physician spoke with the urologist and stated she was high risk for bleeding with starting anticoagulation.  PAST MEDICAL HISTORY:   Past Medical History:  Diagnosis Date  . Anemia   . Anxiety   . Arthritis   . Cancer [facility])    GIST  . Depression   . GERD (gastroesophageal reflux disease)   . GIST (gastrointestinal stroma tumor), malignant, colon ([place])   . History of hiatal hernia   . History of kidney stones   . Hypertension    NO MEDS NOW  . IBS (irritable bowel syndrome)   . Tremors of nervous system    HEAD    PAST SURGICAL HISTORY:   Past Surgical History:  Procedure Laterality Date  . ABDOMINAL HYSTERECTOMY    . BREAST BIOPSY Right   . CATARACT EXTRACTION W/PHACO Right 01/28/2015   Procedure: CATARACT EXTRACTION PHACO AND INTRAOCULAR LENS PLACEMENT (IOC);  Surgeon: Estill Cotta, MD;  Location: [facility] ORS;  Service: Ophthalmology;  Laterality: Right;  [name] 00:59.3 AP% 22.6 CDE 24.69 fluid pack lot #[id number]  . CATARACT EXTRACTION  W/PHACO Left 02/11/2015   Procedure: CATARACT EXTRACTION PHACO AND INTRAOCULAR LENS PLACEMENT (IOC);  Surgeon: Estill Cotta, MD;  Location: [facility] ORS;  Service: Ophthalmology;  Laterality: Left;  [name] 01:18 AP% 22.8 CDE 32.38  fluid pack lot # [id number]  . CHOLECYSTECTOMY    . COLON SURGERY     COLOSTOMY AND REVERSAL  . CYSTOSCOPY W/ RETROGRADES Bilateral 11/02/2016   Procedure: CYSTOSCOPY WITH RETROGRADE PYELOGRAM;  Surgeon: Hollice Espy, MD;  Location: [facility] ORS;  Service: Urology;  Laterality: Bilateral;  . CYSTOSCOPY WITH STENT PLACEMENT Bilateral 11/02/2016   Procedure: CYSTOSCOPY WITH STENT PLACEMENT;  Surgeon: Hollice Espy, MD;  Location: [facility] ORS;  Service: Urology;  Laterality: Bilateral;  . STOMACH SURGERY     CANCER  . TRANSURETHRAL RESECTION OF BLADDER TUMOR N/A 11/02/2016   Procedure: TRANSURETHRAL RESECTION OF BLADDER TUMOR (TURBT) >5CM;  Surgeon: Hollice Espy, MD;  Location: [facility] ORS;  Service: Urology;  Laterality: N/A;    SOCIAL HISTORY:   Social History  Substance Use Topics  . Smoking status: Never Smoker  . Smokeless tobacco: Never Used  . Alcohol use No    FAMILY HISTORY:   Family History  Problem Relation Age of Onset  . Breast cancer Maternal Grandmother   . CAD Mother   . CAD Father     DRUG ALLERGIES:   Allergies  Allergen Reactions  . Other Swelling    Magic mouthwash  . Penicillins  Swelling    Has patient had a PCN reaction causing immediate rash, facial/tongue/throat swelling, SOB or lightheadedness with hypotension: No Has patient had a PCN reaction causing severe rash involving mucus membranes or skin necrosis: No Has patient had a PCN reaction that required hospitalization: No Has patient had a PCN reaction occurring within the last 10 years: No If all of the above answers are "NO", then may proceed with Cephalosporin use.    . Ciprofloxacin Diarrhea    GI distress, abd cramping    REVIEW OF SYSTEMS:  CONSTITUTIONAL: No  fever. Positive for cold feeling. Positive for weakness.  EYES: No blurred or double vision.  EARS, NOSE, AND THROAT: No tinnitus or ear pain. No sore throat. Decreased hearing RESPIRATORY: No cough, shortness of breath, wheezing or hemoptysis.  CARDIOVASCULAR: No chest pain, orthopnea, edema.  GASTROINTESTINAL: No nausea, vomiting, diarrhea. Positive for abdominal pain abdominal pain. No blood in bowel movements GENITOURINARY: Has a catheter ENDOCRINE: No polyuria, nocturia,  HEMATOLOGY: No anemia, easy bruising or bleeding SKIN: No rash or lesion. MUSCULOSKELETAL: No joint pain or arthritis.   NEUROLOGIC: No tingling, numbness, weakness.  PSYCHIATRY: No anxiety or depression.   MEDICATIONS AT HOME:   Prior to Admission medications   Medication Sig Start Date End Date Taking? Authorizing Provider  Cetirizine HCl (ZYRTEC PO) Take 1 tablet by mouth daily.    Yes [provider]  Cholecalciferol (VITAMIN D3 ADULT GUMMIES) 1000 units CHEW Chew 1 each by mouth daily.   Yes [provider]  Cyanocobalamin (RA VITAMIN B-12 TR) 1000 MCG TBCR Take 2 tablets by mouth daily.    Yes [provider]  Iron-Vitamin C (VITRON-C) 65-125 MG TABS Take 2 tablets by mouth daily.    Yes [provider]  megestrol (MEGACE) 40 MG tablet daily three days a week 05/13/15  Yes [provider]  Multiple Vitamin (MULTIVITAMIN) tablet Take 1 tablet by mouth daily.   Yes [provider]  Multiple Vitamins-Minerals (PRESERVISION AREDS PO) Take 2 tablets by mouth daily.   Yes [provider]  potassium chloride SA (K-DUR,KLOR-CON) 20 MEQ tablet Take 1 tablet (20 mEq total) by mouth daily. 05/03/15  Yes Choksi, Delorise Shiner, MD  gabapentin (NEURONTIN) 100 MG capsule Take 100 mg by mouth 3 (three) times daily.    [provider]      VITAL SIGNS:  Blood pressure (!) 155/84, pulse 84, temperature 98.6 F (37 C), temperature source Oral, resp. rate 16,  height 4\' 11"  (1.499 m), weight 58.5 kg (129 lb), SpO2 99 %.  PHYSICAL EXAMINATION:  GENERAL:  76 y.o.-year-old patient lying in the bed with no acute distress.  EYES: Pupils equal, round, reactive to light and accommodation. No scleral icterus. Extraocular muscles intact.  HEENT: Head atraumatic, normocephalic. Oropharynx and nasopharynx clear.  NECK:  Supple, no jugular venous distention. No thyroid enlargement, no tenderness.  LUNGS: Normal breath sounds bilaterally, no wheezing, rales,rhonchi or crepitation. No use of accessory muscles of respiration.  CARDIOVASCULAR: S1, S2 normal. No murmurs, rubs, or gallops.  ABDOMEN: Soft, nontender, nondistended. Bowel sounds present. No organomegaly or mass.  EXTREMITIES: Left leg pain to palpation, cyanosis, or clubbing.  NEUROLOGIC: Cranial nerves II through XII are intact. Muscle strength 5/5 in all extremities. Sensation intact. Gait not checked.  PSYCHIATRIC: The patient is alert and oriented x 3.  SKIN: No rash, lesion, or ulcer.   LABORATORY PANEL:   CBC  Recent Labs Lab 11/07/16 1023  WBC 7.8  HGB 8.4*  HCT 23.6*  PLT 399   ------------------------------------------------------------------------------------------------------------------  Chemistries   Recent Labs Lab 11/07/16 1023  NA 135  K 3.1*  CL 101  CO2 24  GLUCOSE 100*  BUN 15  CREATININE 1.07*  CALCIUM 8.4*  AST 27  ALT 27  ALKPHOS 49  BILITOT 0.8   ------------------------------------------------------------------------------------------------------------------  RADIOLOGY:  Ct Abdomen Pelvis W Contrast  Result Date: 11/07/2016 CLINICAL DATA:  Pt states she's been having back and abdominal pain, pain increases with movement, pt states last Monday bladder stints placed, hx epidural, hernia repair, GB, GERD, hyst. EXAM: CT ABDOMEN AND PELVIS WITH CONTRAST TECHNIQUE: Multidetector CT imaging of the abdomen and pelvis was performed using the standard protocol  following bolus administration of intravenous contrast. CONTRAST:  88mL ISOVUE-300 IOPAMIDOL (ISOVUE-300) INJECTION 61% COMPARISON:  06/01/2016 FINDINGS: Lower chest: 4 mm nodule, left lower lobe, image 4, series 4, new since the prior CT. Minor dependent subsegmental atelectasis. No acute findings. Hepatobiliary: No focal liver abnormality is seen. Status post cholecystectomy. No biliary dilatation. Pancreas: Unremarkable. No pancreatic ductal dilatation or surrounding inflammatory changes. Spleen: Unremarkable. Adrenals/Urinary Tract: No adrenal masses. Bilateral ureteral stents appear well-positioned. Mild left and mild-to-moderate right renal collecting system dilation. Small low-density mass consistent with a cyst from the superior margin of the left kidney, measuring 7 mm. No other renal masses or lesions. No stones. There is irregular wall thickening of the posterior inferior bladder. Bladder is partly decompressed by Foley catheter. Stomach/Bowel: Moderate size hiatal hernia. Stomach otherwise unremarkable. There has been a previous partial colon resection. There are right and left: Bowel anastomosis staple lines. A few left colon diverticula noted. There is no evidence diverticulitis or other colonic inflammatory process. The small bowel is unremarkable. Normal appendix is visualized. There is a 16 mm nodule adjacent to the left transverse colon in the left upper quadrant, within the peritoneal cavity. Vascular/Lymphatic: There is bulky retroperitoneal adenopathy. Several reference measurements were made. An enlarged node or confluence of lymph nodes surrounds the aorta at the level of the of the renal vessels. An aortocaval portion of this measures 2.4 cm in short axis. There are enlarged pelvic lymph nodes. Largest is a left external iliac chain node measuring 3.7 x 2.8 cm. There is an enlarged left inguinal lymph node measuring 1.6 cm in short axis. There is aortic and branch vessel atherosclerotic  calcifications. No aneurysm or dissection. The inferior vena cava is compressed have the level of the retroperitoneal adenopathy. There is also compression of common iliac veins. There may be thrombus in both common femoral veins versus mixed unenhanced and enhanced blood. Reproductive: Status post hysterectomy. No adnexal masses. Other: Trace ascites is seen inferior to the pancreas and along the left anterior pararenal fascia, and collecting in the posterior pelvic recess. Musculoskeletal: No acute fracture. No osteoblastic or osteolytic lesions. IMPRESSION: 1. There is bulky retroperitoneal, pelvic and left inguinal adenopathy, new since the prior CT. Nodes compress the inferior vena cava and iliac veins. There may be thrombus in the common femoral veins. Consider follow-up bilateral lower extremity venous Doppler evaluation. 2. Bilateral ureteral stents are well positioned. There is mild to moderate right and mild left hydronephrosis. 3. Irregular wall thickening of the posterior and inferior bladder. This may be inflammatory or reflect neoplasm. 4. Moderate hiatal hernia. 5. 16 mm soft tissue nodule in the left upper quadrant anterior peritoneal cavity a reflect an enlarged lymph node or a neoplastic peritoneal deposit. 6. Trace ascites. 7. Aortic atherosclerosis. Electronically Signed   By:  Lajean Manes M.D.   On: 11/07/2016 13:47   US Venous Img Lower Bilateral  Result Date: 11/07/2016 CLINICAL DATA:  Left leg pain. Possible femoral vein thrombosis on CT. EXAM: BILATERAL LOWER EXTREMITY VENOUS DOPPLER ULTRASOUND TECHNIQUE: Gray-scale sonography with graded compression, as well as color Doppler and duplex ultrasound were performed to evaluate the lower extremity deep venous systems from the level of the common femoral vein and including the common femoral, femoral, profunda femoral, popliteal and calf veins including the posterior tibial, peroneal and gastrocnemius veins when visible. The superficial  great saphenous vein was also interrogated. Spectral Doppler was utilized to evaluate flow at rest and with distal augmentation maneuvers in the common femoral, femoral and popliteal veins. COMPARISON:  None. FINDINGS: RIGHT LOWER EXTREMITY Common Femoral Vein: No evidence of thrombus. Normal compressibility, respiratory phasicity and response to augmentation. Saphenofemoral Junction: No evidence of thrombus. Profunda Femoral Vein: No evidence of thrombus. Femoral Vein: No evidence of thrombus. Popliteal Vein: No evidence of thrombus. Calf Veins: No evidence of thrombus. Superficial Great Saphenous Vein: No evidence of thrombus. LEFT LOWER EXTREMITY Common Femoral Vein: Nonocclusive luminal clot is seen within the partially compressible vessel. There is neighboring bulky adenopathy in this patient with known malignancy. Saphenofemoral Junction: No evidence of thrombus. Profunda Femoral Vein: No evidence of thrombus. Femoral Vein: No evidence of thrombus. Popliteal Vein: No evidence of thrombus. Calf Veins: Thrombus within a posterior tibial vein, occlusive. Other Findings: Diffusely monophasic flow correlating with pelvic retroperitoneal adenopathy impinging on the iliac vein. IMPRESSION: 1. Confirmed left lower extremity DVT, nonocclusive in the common femoral vein and occlusive in the posterior tibial vein. 2. 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 Electronically Signed   By: Monte Fantasia M.D.   On: 11/07/2016 16:10     IMPRESSION AND PLAN:   1.  Left lower extremity DVT. Patient does not want to start IV heparin and rather do pill form of anticoagulation. I will start Eliquis 10 mg twice a day. Patient is a higher risk of bleeding with her urological procedure that she just had. Monitor urine and hemoglobin. 2. Abdominal lymphadenopathy. History of GIST and now a bladder tumor. Case discussed with Dr. Rogue Bussing oncology. He advised treating  DVT at this point. He is hoping that pathology of bladder tumor May give some answers. Outpatient PET scan. 3. Anemia. Send off a ferritin. Start ferrous sulfate. May end up needing a transfusion if she bleeds. 4. Hypokalemia. Replace potassium and check a magnesium   All the records are reviewed and case discussed with ED provider. Management plans discussed with the patient, family and they are in agreement.  CODE STATUS: Full code  TOTAL TIME TAKING CARE OF THIS PATIENT: 50 minutes.    Loletha Grayer M.D on 11/07/2016 at 5:33 PM  Between 7am to 6pm - Pager - [phone]  After 6pm call admission pager [phone]  [facility] Office  [phone]  CC: Primary care physician; Rusty Aus, MD

## 2016-11-07 NOTE — Consult Note (Signed)
ANTICOAGULATION CONSULT NOTE - Initial Consult  Pharmacy Consult for apixaban Indication: DVT  Allergies  Allergen Reactions  . Other Swelling    Magic mouthwash  . Penicillins Swelling    Has patient had a PCN reaction causing immediate rash, facial/tongue/throat swelling, SOB or lightheadedness with hypotension: No Has patient had a PCN reaction causing severe rash involving mucus membranes or skin necrosis: No Has patient had a PCN reaction that required hospitalization: No Has patient had a PCN reaction occurring within the last 10 years: No If all of the above answers are "NO", then may proceed with Cephalosporin use.    . Ciprofloxacin Diarrhea    GI distress, abd cramping    Patient Measurements: Height: 4\' 11"  (149.9 cm) Weight: 129 lb (58.5 kg) IBW/kg (Calculated) : 43.2 Heparin Dosing Weight:   Vital Signs: Temp: 98.6 F (37 C) (08/18 0912) Temp Source: Oral (08/18 0912) BP: 129/53 (08/18 1730) Pulse Rate: 86 (08/18 1730)  Labs:  Recent Labs  11/07/16 1023  HGB 8.4*  HCT 23.6*  PLT 399  CREATININE 1.07*    Estimated Creatinine Clearance: 34.3 mL/min (A) (by C-G formula based on SCr of 1.07 mg/dL (H)).   Medical History: Past Medical History:  Diagnosis Date  . Anemia   . Anxiety   . Arthritis   . Cancer [facility])    GIST  . Depression   . GERD (gastroesophageal reflux disease)   . 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 History of hiatal hernia   . History of kidney stones   . Hypertension    NO MEDS NOW  . IBS (irritable bowel syndrome)   . Tremors of nervous system    HEAD    Medications:  Scheduled:  . apixaban  10 mg Oral BID   Followed by  . [START ON 11/14/2016] apixaban  5 mg Oral BID  . [START ON 11/08/2016] Cholecalciferol  1 each Oral Daily  . [START ON 11/08/2016] Cyanocobalamin  2 tablet Oral Daily  . [START ON 11/08/2016] ferrous sulfate  325 mg Oral TID WC  . morphine  2 mg Intravenous Once  . [START  ON 11/08/2016] multivitamin  1 tablet Oral Daily  . [START ON 11/08/2016] potassium chloride SA  20 mEq Oral Daily  . [START ON 11/08/2016] PRESERVISION AREDS  2 capsule Oral Daily    Assessment: Pt is a 76 year old female who presents with abdominal, back and leg pain after a recent bladder stent placement Pt found to have a DVT of LLE and increased lymphadenopathy of the abdomen. Pharmacy consulted to dose apixaban. Pt is being started inpatient bc she is high risk for bleeding.  Goal of Therapy:   Monitor platelets by anticoagulation protocol: Yes   Plan:  apixaban 10mg  BID x 7 days then 5mg  BID Will monitor CBC closely due to risk of bleeding. Baseline Hgb a little low at 8.4  Tvisha Schwoerer D Ivin Rosenbloom, Pharm.D, BCPS Clinical Pharmacist  11/07/2016,5:38 PM

## 2016-11-08 DIAGNOSIS — R310 Gross hematuria: Secondary | ICD-10-CM

## 2016-11-08 DIAGNOSIS — M1990 Unspecified osteoarthritis, unspecified site: Secondary | ICD-10-CM

## 2016-11-08 DIAGNOSIS — C49A4 Gastrointestinal stromal tumor of large intestine: Secondary | ICD-10-CM

## 2016-11-08 DIAGNOSIS — I82492 Acute embolism and thrombosis of other specified deep vein of left lower extremity: Secondary | ICD-10-CM

## 2016-11-08 DIAGNOSIS — R410 Disorientation, unspecified: Secondary | ICD-10-CM

## 2016-11-08 DIAGNOSIS — N1330 Unspecified hydronephrosis: Secondary | ICD-10-CM

## 2016-11-08 DIAGNOSIS — Z7901 Long term (current) use of anticoagulants: Secondary | ICD-10-CM

## 2016-11-08 DIAGNOSIS — Z9221 Personal history of antineoplastic chemotherapy: Secondary | ICD-10-CM

## 2016-11-08 DIAGNOSIS — D649 Anemia, unspecified: Secondary | ICD-10-CM

## 2016-11-08 DIAGNOSIS — C786 Secondary malignant neoplasm of retroperitoneum and peritoneum: Secondary | ICD-10-CM

## 2016-11-08 DIAGNOSIS — Z79899 Other long term (current) drug therapy: Secondary | ICD-10-CM

## 2016-11-08 DIAGNOSIS — D494 Neoplasm of unspecified behavior of bladder: Secondary | ICD-10-CM

## 2016-11-08 DIAGNOSIS — K219 Gastro-esophageal reflux disease without esophagitis: Secondary | ICD-10-CM

## 2016-11-08 LAB — IRON AND TIBC
Iron: 31 ug/dL (ref 28–170)
Saturation Ratios: 12 % (ref 10.4–31.8)
TIBC: 258 ug/dL (ref 250–450)
UIBC: 227 ug/dL

## 2016-11-08 LAB — BASIC METABOLIC PANEL
ANION GAP: 7 (ref 5–15)
BUN: 13 mg/dL (ref 6–20)
CHLORIDE: 106 mmol/L (ref 101–111)
CO2: 25 mmol/L (ref 22–32)
CREATININE: 0.94 mg/dL (ref 0.44–1.00)
Calcium: 7.7 mg/dL — ABNORMAL LOW (ref 8.9–10.3)
GFR calc non Af Amer: 57 mL/min — ABNORMAL LOW (ref >60)
Glucose, Bld: 96 mg/dL (ref 65–99)
POTASSIUM: 4 mmol/L (ref 3.5–5.1)
Sodium: 138 mmol/L (ref 135–145)

## 2016-11-08 LAB — CBC
HEMATOCRIT: 21.4 % — AB (ref 35.0–47.0)
HEMOGLOBIN: 7.5 g/dL — AB (ref 12.0–16.0)
MCH: 37.6 pg — AB (ref 26.0–34.0)
MCHC: 35.2 g/dL (ref 32.0–36.0)
MCV: 106.9 fL — AB (ref 80.0–100.0)
Platelets: 353 10*3/uL (ref 150–440)
RBC: 2 MIL/uL — AB (ref 3.80–5.20)
RDW: 12.9 % (ref 11.5–14.5)
WBC: 6.9 10*3/uL (ref 3.6–11.0)

## 2016-11-08 LAB — URINE CULTURE: CULTURE: NO GROWTH

## 2016-11-08 LAB — HEMOGLOBIN AND HEMATOCRIT, BLOOD
HCT: 28 % — ABNORMAL LOW (ref 35.0–47.0)
Hemoglobin: 9.7 g/dL — ABNORMAL LOW (ref 12.0–16.0)

## 2016-11-08 LAB — PREPARE RBC (CROSSMATCH)

## 2016-11-08 LAB — LACTATE DEHYDROGENASE: LDH: 496 U/L — AB (ref 98–192)

## 2016-11-08 LAB — PROTIME-INR
INR: 1.97
Prothrombin Time: 22.7 seconds — ABNORMAL HIGH (ref 11.4–15.2)

## 2016-11-08 LAB — HEPARIN LEVEL (UNFRACTIONATED)

## 2016-11-08 LAB — FOLATE: Folate: 34 ng/mL (ref >5.9)

## 2016-11-08 LAB — APTT: aPTT: 40 seconds — ABNORMAL HIGH (ref 24–36)

## 2016-11-08 MED ORDER — MORPHINE SULFATE (PF) 2 MG/ML IV SOLN
1.0000 mg | INTRAVENOUS | Status: DC | PRN
  Administered 2016-11-08 – 2016-11-18 (×14): 1 mg via INTRAVENOUS
  Filled 2016-11-08 (×16): qty 1

## 2016-11-08 MED ORDER — SODIUM CHLORIDE 0.9 % IV SOLN
Freq: Once | INTRAVENOUS | Status: AC
  Administered 2016-11-08: 12:00:00 via INTRAVENOUS

## 2016-11-08 MED ORDER — HEPARIN (PORCINE) IN NACL 100-0.45 UNIT/ML-% IJ SOLN
900.0000 [IU]/h | INTRAMUSCULAR | Status: DC
  Administered 2016-11-08 – 2016-11-11 (×3): 900 [IU]/h via INTRAVENOUS
  Filled 2016-11-08 (×3): qty 250

## 2016-11-08 NOTE — Progress Notes (Signed)
Pt complaining of increase pain. Primary nurse notified Dr. Almyra Free. Orders placed By Dr. Almyra Free, Pt also noted to start  having bloody urine in foley. Dr. Almyra Free made aware. Primary nurse to continue to monitor.

## 2016-11-08 NOTE — Progress Notes (Signed)
[facility] [place] NAME: Courtney [name]    MR#:  [id number]  DATE OF BIRTH:  03-13-1941  SUBJECTIVE:  Patient has blood in foley no CP/SOB  REVIEW OF SYSTEMS:    Review of Systems  Constitutional: Negative for fever, chills weight loss HENT: Negative for ear pain, nosebleeds, congestion, facial swelling, rhinorrhea, neck pain, neck stiffness and ear discharge.   Respiratory: Negative for cough, shortness of breath, wheezing  Cardiovascular: Negative for chest pain, palpitations and leg swelling.  Gastrointestinal: Negative for heartburn, abdominal pain, vomiting, diarrhea or consitpation Genitourinary: Negative for dysuria, urgency, frequency, ++ hematuria Musculoskeletal: Negative for back pain or joint pain Neurological: Negative for dizziness, seizures, syncope, focal weakness,  numbness and headaches.  Hematological: Does not bruise/bleed easily.  Psychiatric/Behavioral: Negative for hallucinations, confusion, dysphoric mood    Tolerating Diet: yes      DRUG ALLERGIES:   Allergies  Allergen Reactions  . Other Swelling    Magic mouthwash  . Penicillins Swelling    Has patient had a PCN reaction causing immediate rash, facial/tongue/throat swelling, SOB or lightheadedness with hypotension: No Has patient had a PCN reaction causing severe rash involving mucus membranes or skin necrosis: No Has patient had a PCN reaction that required hospitalization: No Has patient had a PCN reaction occurring within the last 10 years: No If all of the above answers are "NO", then may proceed with Cephalosporin use.    . Ciprofloxacin Diarrhea    GI distress, abd cramping    VITALS:  Blood pressure (!) 137/52, pulse 78, temperature 98.4 F (36.9 C), temperature source Oral, resp. rate 20, height 4\' 11"  (1.499 m), weight 58.5 kg (129 lb), SpO2 100 %.  PHYSICAL EXAMINATION:  Constitutional: Appears well-developed and well-nourished. No  distress. HENT: Normocephalic. Marland Kitchen Oropharynx is clear and moist.  Eyes: Conjunctivae and EOM are normal. PERRLA, no scleral icterus.  Neck: Normal ROM. Neck supple. No JVD. No tracheal deviation. CVS: RRR, S1/S2 +, no murmurs, no gallops, no carotid bruit.  Pulmonary: Effort and breath sounds normal, no stridor, rhonchi, wheezes, rales.  Abdominal: Soft. BS +,  no distension, tenderness, rebound or guarding.  Musculoskeletal: Normal range of motion. No edema and no tenderness.  Neuro: Alert. CN 2-12 grossly intact. No focal deficits. Skin: Skin is warm and dry. No rash noted. Psychiatric: Normal mood and affect.      LABORATORY PANEL:   CBC  Recent Labs Lab 11/08/16 0410  WBC 6.9  HGB 7.5*  HCT 21.4*  PLT 353   ------------------------------------------------------------------------------------------------------------------  Chemistries   Recent Labs Lab 11/07/16 1023 11/08/16 0410  NA 135 138  K 3.1* 4.0  CL 101 106  CO2 24 25  GLUCOSE 100* 96  BUN 15 13  CREATININE 1.07* 0.94  CALCIUM 8.4* 7.7*  MG 1.3*  --   AST 27  --   ALT 27  --   ALKPHOS 49  --   BILITOT 0.8  --    ------------------------------------------------------------------------------------------------------------------  Cardiac Enzymes No results for input(s): TROPONINI in the last 168 hours. ------------------------------------------------------------------------------------------------------------------  RADIOLOGY:  Ct Abdomen Pelvis W Contrast  Result Date: 11/07/2016 CLINICAL DATA:  Pt states she's been having back and abdominal pain, pain increases with movement, pt states last Monday bladder stints placed, hx epidural, hernia repair, GB, GERD, hyst. EXAM: CT ABDOMEN AND PELVIS WITH CONTRAST TECHNIQUE: Multidetector CT imaging of the abdomen and pelvis was performed using the standard protocol following bolus administration of intravenous contrast.  CONTRAST:  40mL ISOVUE-300 IOPAMIDOL  (ISOVUE-300) INJECTION 61% COMPARISON:  06/01/2016 FINDINGS: Lower chest: 4 mm nodule, left lower lobe, image 4, series 4, new since the prior CT. Minor dependent subsegmental atelectasis. No acute findings. Hepatobiliary: No focal liver abnormality is seen. Status post cholecystectomy. No biliary dilatation. Pancreas: Unremarkable. No pancreatic ductal dilatation or surrounding inflammatory changes. Spleen: Unremarkable. Adrenals/Urinary Tract: No adrenal masses. Bilateral ureteral stents appear well-positioned. Mild left and mild-to-moderate right renal collecting system dilation. Small low-density mass consistent with a cyst from the superior margin of the left kidney, measuring 7 mm. No other renal masses or lesions. No stones. There is irregular wall thickening of the posterior inferior bladder. Bladder is partly decompressed by Foley catheter. Stomach/Bowel: Moderate size hiatal hernia. Stomach otherwise unremarkable. There has been a previous partial colon resection. There are right and left: Bowel anastomosis staple lines. A few left colon diverticula noted. There is no evidence diverticulitis or other colonic inflammatory process. The small bowel is unremarkable. Normal appendix is visualized. There is a 16 mm nodule adjacent to the left transverse colon in the left upper quadrant, within the peritoneal cavity. Vascular/Lymphatic: There is bulky retroperitoneal adenopathy. Several reference measurements were made. An enlarged node or confluence of lymph nodes surrounds the aorta at the level of the of the renal vessels. An aortocaval portion of this measures 2.4 cm in short axis. There are enlarged pelvic lymph nodes. Largest is a left external iliac chain node measuring 3.7 x 2.8 cm. There is an enlarged left inguinal lymph node measuring 1.6 cm in short axis. There is aortic and branch vessel atherosclerotic calcifications. No aneurysm or dissection. The inferior vena cava is compressed have the level of  the retroperitoneal adenopathy. There is also compression of common iliac veins. There may be thrombus in both common femoral veins versus mixed unenhanced and enhanced blood. Reproductive: Status post hysterectomy. No adnexal masses. Other: Trace ascites is seen inferior to the pancreas and along the left anterior pararenal fascia, and collecting in the posterior pelvic recess. Musculoskeletal: No acute fracture. No osteoblastic or osteolytic lesions. IMPRESSION: 1. There is bulky retroperitoneal, pelvic and left inguinal adenopathy, new since the prior CT. Nodes compress the inferior vena cava and iliac veins. There may be thrombus in the common femoral veins. Consider follow-up bilateral lower extremity venous Doppler evaluation. 2. Bilateral ureteral stents are well positioned. There is mild to moderate right and mild left hydronephrosis. 3. Irregular wall thickening of the posterior and inferior bladder. This may be inflammatory or reflect neoplasm. 4. Moderate hiatal hernia. 5. 16 mm soft tissue nodule in the left upper quadrant anterior peritoneal cavity a reflect an enlarged lymph node or a neoplastic peritoneal deposit. 6. Trace ascites. 7. Aortic atherosclerosis. Electronically Signed   By: Lajean Manes M.D.   On: 11/07/2016 13:47   US Venous Img Lower Bilateral  Result Date: 11/07/2016 CLINICAL DATA:  Left leg pain. Possible femoral vein thrombosis on CT. EXAM: BILATERAL LOWER EXTREMITY VENOUS DOPPLER ULTRASOUND TECHNIQUE: Gray-scale sonography with graded compression, as well as color Doppler and duplex ultrasound were performed to evaluate the lower extremity deep venous systems from the level of the common femoral vein and including the common femoral, femoral, profunda femoral, popliteal and calf veins including the posterior tibial, peroneal and gastrocnemius veins when visible. The superficial great saphenous vein was also interrogated. Spectral Doppler was utilized to evaluate flow at rest and  with distal augmentation maneuvers in the common femoral, femoral and popliteal veins. COMPARISON:  None. FINDINGS: RIGHT LOWER EXTREMITY Common Femoral Vein: No evidence of thrombus. Normal compressibility, respiratory phasicity and response to augmentation. Saphenofemoral Junction: No evidence of thrombus. Profunda Femoral Vein: No evidence of thrombus. Femoral Vein: No evidence of thrombus. Popliteal Vein: No evidence of thrombus. Calf Veins: No evidence of thrombus. Superficial Great Saphenous Vein: No evidence of thrombus. LEFT LOWER EXTREMITY Common Femoral Vein: Nonocclusive luminal clot is seen within the partially compressible vessel. There is neighboring bulky adenopathy in this patient with known malignancy. Saphenofemoral Junction: No evidence of thrombus. Profunda Femoral Vein: No evidence of thrombus. Femoral Vein: No evidence of thrombus. Popliteal Vein: No evidence of thrombus. Calf Veins: Thrombus within a posterior tibial vein, occlusive. Other Findings: Diffusely monophasic flow correlating with pelvic retroperitoneal adenopathy impinging on the iliac vein. IMPRESSION: 1. Confirmed left lower extremity DVT, nonocclusive in the common femoral vein and occlusive in the posterior tibial vein. 2. 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 Electronically Signed   By: Monte Fantasia M.D.   On: 11/07/2016 16:10     ASSESSMENT AND PLAN:   [name] with history of progressive metastatic GIST dx 02/2014 and recent diagnosis of bladder tumor s/p TURBT and bilateral placement who presented with left leg pain and found to have DVT.   1. Left lower extremity DVT, nonocclusive in the common femoral vein and occlusive in the posterior tibial vein: Currently on Eliquis, However if she continues to have hematuria may need IVC filter  2. Acute on chronic blood loss anemia with recent diagnosis of bladder tumor status post TURBTPatient is consented  for blood transfusion CBC in a.m. Continue ferrous sulfate  3.history of GISTT and bladder tumor: Patient will follow-up with Dr. Yevette Edwards UROLOGY consult requested for hematuria  4.hypokalemia: Resolved   Management plans discussed with the patient and she is in agreement.  CODE STATUS: full  TOTAL TIME TAKING CARE OF THIS PATIENT: 30 minutes.     POSSIBLE D/C 2 days, DEPENDING ON CLINICAL CONDITION.   Sarai January M.D on 11/08/2016 at 10:16 AM  Between 7am to 6pm - Pager - [phone] After 6pm go to [email or web address] - password [email or web address] [facility]  Office  [phone]  CC: Primary care physician; Rusty Aus, MD  Note: This dictation was prepared with Dragon dictation along with smaller phrase technology. Any transcriptional errors that result from this process are unintentional.

## 2016-11-08 NOTE — Consult Note (Signed)
UROLOGY CONSULT NOTE  Service Requesting Consultation: Hospital Medicine  Service Providing Consultation: B. Gerasimos Plotts on behalf of [facility].  Author: Heron Sabins, MD 11/08/2016 11:10 AM SUBJECTIVE:  REASON FOR CONSULTATION:  DVT s.p TURBT, stent placement, hematuria on anticoagulation  HPI: Jonice Cerra is a 76 y.o. old female with a history of GIST abdominal tumor and recent diagnosis of bladder tumor (pathology unknown) s/p TURBT and bilateral ureteral stent placement for malignant obstruction on 11/02/16 with Dr. Erlene Quan.  Ms. Hackenberg presented 11/07/16 with left leg pain and itching as well as left lower abdominal discomfort since stent placement on Monday. In the ER, she was found to have a DVT of the left lower extremity on ultrasound. She was also found to have improved bilateral hydronephrosis but increasing abdominal and retroperitoneal lymphadenopathy on CT scan. Labs were notable for improved renal function, anemia with Hgb of 8. Given her high risk for bleeding with starting anticoagulation, she was admitted to the hospital. She was given a single dose of apixaban on 11/07/16 and developed mild hematuria overnight into 11/08/16. In addition, her Hgb dropped to 7.5. Oncology has been consulted and have recommend switching to heparin drip for greater control over anticoagulation in light of anemia and hematuria. Urology consulted given recent surgery and hematuria.  This morning, she states she is feeling better overall. She denies any issues with catheter drainage overnight.    PMH: Past Medical History:  Diagnosis Date  . Anemia   . Anxiety   . Arthritis   . Cancer [facility])    GIST  . Depression   . GERD (gastroesophageal reflux disease)   . GIST (gastrointestinal stroma tumor), malignant, colon ([place])   . History of hiatal hernia   . History of kidney stones   . Hypertension    NO MEDS NOW  . IBS (irritable bowel syndrome)   . Tremors of nervous system    HEAD   :  PSH: Past Surgical History:  Procedure Laterality Date  . ABDOMINAL HYSTERECTOMY    . BREAST BIOPSY Right   . CATARACT EXTRACTION W/PHACO Right 01/28/2015   Procedure: CATARACT EXTRACTION PHACO AND INTRAOCULAR LENS PLACEMENT (IOC);  Surgeon: Estill Cotta, MD;  Location: [facility] ORS;  Service: Ophthalmology;  Laterality: Right;  [name] 00:59.3 AP% 22.6 CDE 24.69 fluid pack lot #[id number]  . CATARACT EXTRACTION W/PHACO Left 02/11/2015   Procedure: CATARACT EXTRACTION PHACO AND INTRAOCULAR LENS PLACEMENT (IOC);  Surgeon: Estill Cotta, MD;  Location: [facility] ORS;  Service: Ophthalmology;  Laterality: Left;  [name] 01:18 AP% 22.8 CDE 32.38  fluid pack lot # [id number]  . CHOLECYSTECTOMY    . COLON SURGERY     COLOSTOMY AND REVERSAL  . CYSTOSCOPY W/ RETROGRADES Bilateral 11/02/2016   Procedure: CYSTOSCOPY WITH RETROGRADE PYELOGRAM;  Surgeon: Hollice Espy, MD;  Location: [facility] ORS;  Service: Urology;  Laterality: Bilateral;  . CYSTOSCOPY WITH STENT PLACEMENT Bilateral 11/02/2016   Procedure: CYSTOSCOPY WITH STENT PLACEMENT;  Surgeon: Hollice Espy, MD;  Location: [facility] ORS;  Service: Urology;  Laterality: Bilateral;  . STOMACH SURGERY     CANCER  . TRANSURETHRAL RESECTION OF BLADDER TUMOR N/A 11/02/2016   Procedure: TRANSURETHRAL RESECTION OF BLADDER TUMOR (TURBT) >5CM;  Surgeon: Hollice Espy, MD;  Location: [facility] ORS;  Service: Urology;  Laterality: N/A;  :  FAMILY HISTORY: Family History  Problem Relation Age of Onset  . Breast cancer Maternal Grandmother   . CAD Mother   . CAD Father   :   SOCIAL HISTORY: Social History  Substance Use Topics  . Smoking status: Never Smoker  . Smokeless tobacco: Never Used  . Alcohol use No  .    ALLERGIES: Allergies  Allergen Reactions  . Other Swelling    Magic mouthwash  . Penicillins Swelling    Has patient had a PCN reaction causing immediate rash, facial/tongue/throat swelling, SOB or lightheadedness with hypotension: No Has  patient had a PCN reaction causing severe rash involving mucus membranes or skin necrosis: No Has patient had a PCN reaction that required hospitalization: No Has patient had a PCN reaction occurring within the last 10 years: No If all of the above answers are "NO", then may proceed with Cephalosporin use.    . Ciprofloxacin Diarrhea    GI distress, abd cramping  :  MEDICATIONS: No current facility-administered medications on file prior to encounter.    Current Outpatient Prescriptions on File Prior to Encounter  Medication Sig Dispense Refill  . Cetirizine HCl (ZYRTEC PO) Take 1 tablet by mouth daily.     . Cholecalciferol (VITAMIN D3 ADULT GUMMIES) 1000 units CHEW Chew 1 each by mouth daily.    . Cyanocobalamin (RA VITAMIN B-12 TR) 1000 MCG TBCR Take 2 tablets by mouth daily.     . Iron-Vitamin C (VITRON-C) 65-125 MG TABS Take 2 tablets by mouth daily.     . megestrol (MEGACE) 40 MG tablet daily three days a week  2  . Multiple Vitamin (MULTIVITAMIN) tablet Take 1 tablet by mouth daily.    . potassium chloride SA (K-DUR,KLOR-CON) 20 MEQ tablet Take 1 tablet (20 mEq total) by mouth daily. 30 tablet 0  :  OBJECTIVE:   REVIEW OF SYSTEMS:  Review of Symptoms: A 10-point review of system was performed and was negative other than as noted in the HPI  PHYSICAL EXAM:  Temp:  [97.5 F (36.4 C)-98.6 F (37 C)] 98.4 F (36.9 C) (08/19 0435) Pulse Rate:  [58-94] 78 (08/19 0435) Resp:  [16-20] 20 (08/19 0435) BP: (107-184)/(47-110) 137/52 (08/19 0435) SpO2:  [97 %-100 %] 100 % (08/19 0435) GENERAL ASSESSMENT: oriented to person, place, and time NEURO: alert, oriented, no focal deficits or movement disorder noted. appropriate affect SKIN EXAM: no rashes, no ecchymoses  HEENT: Atraumatic, normocephalic, EOM intact, No ocular discharge  CV: Regular rate and rhythm CHEST: equal expansion bilaterally, unlabored breathing without accessory muscle use. ABDOMEN: Abdomen is soft without  significant tenderness,she has palpable left inguinal lymphadenopathy that is non-tender. BACK: No CVA tenderness GU:89fr foley cath in place draining thin, light pink urine without clots.        LABS: Lab Results  Component Value Date   WBC 6.9 11/08/2016   HGB 7.5 (L) 11/08/2016   HCT 21.4 (L) 11/08/2016   MCV 106.9 (H) 11/08/2016   PLT 353 11/08/2016   Lab Results  Component Value Date   CREATININE 0.94 11/08/2016     IMAGING: CT ABDOMEN AND PELVIS WITH CONTRAST  TECHNIQUE: Multidetector CT imaging of the abdomen and pelvis was performed using the standard protocol following bolus administration of intravenous contrast.  CONTRAST:  78mL ISOVUE-300 IOPAMIDOL (ISOVUE-300) INJECTION 61%  COMPARISON:  06/01/2016  FINDINGS: Lower chest: 4 mm nodule, left lower lobe, image 4, series 4, new since the prior CT. Minor dependent subsegmental atelectasis. No acute findings.  Hepatobiliary: No focal liver abnormality is seen. Status post cholecystectomy. No biliary dilatation.  Pancreas: Unremarkable. No pancreatic ductal dilatation or surrounding inflammatory changes.  Spleen: Unremarkable.  Adrenals/Urinary Tract: No adrenal masses.  Bilateral ureteral  stents appear well-positioned. Mild left and mild-to-moderate right renal collecting system dilation. Small low-density mass consistent with a cyst from the superior margin of the left kidney, measuring 7 mm. No other renal masses or lesions. No stones.  There is irregular wall thickening of the posterior inferior bladder. Bladder is partly decompressed by Foley catheter.  Stomach/Bowel: Moderate size hiatal hernia. Stomach otherwise unremarkable. There has been a previous partial colon resection. There are right and left: Bowel anastomosis staple lines. A few left colon diverticula noted. There is no evidence diverticulitis or other colonic inflammatory process. The small bowel is unremarkable. Normal  appendix is visualized.  There is a 16 mm nodule adjacent to the left transverse colon in the left upper quadrant, within the peritoneal cavity.  Vascular/Lymphatic: There is bulky retroperitoneal adenopathy. Several reference measurements were made. An enlarged node or confluence of lymph nodes surrounds the aorta at the level of the of the renal vessels. An aortocaval portion of this measures 2.4 cm in short axis. There are enlarged pelvic lymph nodes. Largest is a left external iliac chain node measuring 3.7 x 2.8 cm. There is an enlarged left inguinal lymph node measuring 1.6 cm in short axis.  There is aortic and branch vessel atherosclerotic calcifications. No aneurysm or dissection. The inferior vena cava is compressed have the level of the retroperitoneal adenopathy. There is also compression of common iliac veins. There may be thrombus in both common femoral veins versus mixed unenhanced and enhanced blood.  Reproductive: Status post hysterectomy. No adnexal masses.  Other: Trace ascites is seen inferior to the pancreas and along the left anterior pararenal fascia, and collecting in the posterior pelvic recess.  Musculoskeletal: No acute fracture. No osteoblastic or osteolytic lesions.  IMPRESSION: 1. There is bulky retroperitoneal, pelvic and left inguinal adenopathy, new since the prior CT. Nodes compress the inferior vena cava and iliac veins. There may be thrombus in the common femoral veins. Consider follow-up bilateral lower extremity venous Doppler evaluation. 2. Bilateral ureteral stents are well positioned. There is mild to moderate right and mild left hydronephrosis. 3. Irregular wall thickening of the posterior and inferior bladder. This may be inflammatory or reflect neoplasm. 4. Moderate hiatal hernia. 5. 16 mm soft tissue nodule in the left upper quadrant anterior peritoneal cavity a reflect an enlarged lymph node or a neoplastic peritoneal  deposit. 6. Trace ascites. 7. Aortic atherosclerosis.  BILATERAL LOWER EXTREMITY VENOUS DOPPLER ULTRASOUND  TECHNIQUE: Gray-scale sonography with graded compression, as well as color Doppler and duplex ultrasound were performed to evaluate the lower extremity deep venous systems from the level of the common femoral vein and including the common femoral, femoral, profunda femoral, popliteal and calf veins including the posterior tibial, peroneal and gastrocnemius veins when visible. The superficial great saphenous vein was also interrogated. Spectral Doppler was utilized to evaluate flow at rest and with distal augmentation maneuvers in the common femoral, femoral and popliteal veins.  COMPARISON:  None.  FINDINGS: RIGHT LOWER EXTREMITY  Common Femoral Vein: No evidence of thrombus. Normal compressibility, respiratory phasicity and response to augmentation.  Saphenofemoral Junction: No evidence of thrombus.  Profunda Femoral Vein: No evidence of thrombus.  Femoral Vein: No evidence of thrombus.  Popliteal Vein: No evidence of thrombus.  Calf Veins: No evidence of thrombus.  Superficial Great Saphenous Vein: No evidence of thrombus.  LEFT LOWER EXTREMITY  Common Femoral Vein: Nonocclusive luminal clot is seen within the partially compressible vessel. There is neighboring bulky adenopathy in this patient with known  malignancy.  Saphenofemoral Junction: No evidence of thrombus.  Profunda Femoral Vein: No evidence of thrombus.  Femoral Vein: No evidence of thrombus.  Popliteal Vein: No evidence of thrombus.  Calf Veins: Thrombus within a posterior tibial vein, occlusive.  Other Findings: Diffusely monophasic flow correlating with pelvic retroperitoneal adenopathy impinging on the iliac vein.  IMPRESSION: 1. Confirmed left lower extremity DVT, nonocclusive in the common femoral vein and occlusive in the posterior tibial vein. 2. Monophasic flow  within left lower extremity veins related to known nodal compression in the pelvis.  ASSESSMENT:   76 y.o. old female with a history of GIST tumor, recent bilateral ureteral obstruction requiring bilateral ureteral stent placement as well as TUR of bladder tumor of unknown pathology, now with a DVT requiring initiation of anticoagulation 5 days s/p TUR. -Hematuria after ~12 hrs on apixaban is very mild. Hopefully this is indicative of her ability to tolerate anticoagulation from a hematuria perspective. -Hgb drop overnight cannot be explained by her hematuria as total blood loss to the GU tract is probably less than 170mL given appearance of urine this am. Dilution 2/2 hydration may be better explanation. Obviously, will need careful, serial monitoring.  -Agree with oncology recs for switch to heparin drip for ability to quickly cease anticoagulation should she develop significant bleeding.   PLAN:  -Heparin drip for anticoagulation. Stop apixaban -Would avoid bolus to start heparin as she is already anticoagulated from apixaban dose -Monitor urine quality and quantity for indicators of catheter obstruction -Contact urology if catheter ceases to drain - If she does not tolerate anticoagulation from a hematuria standpoint, would engage vascular-interventional radiology early for consideration of IVC filter placement. -Pt was scheduled for follow up with Dr. Erlene Quan tomorrow 11/09/16. Will relay pts admission to Dr. Erlene Quan tomorrow.   Thank you for allowing me to participate in this patient's care.   Heron Sabins, MD

## 2016-11-08 NOTE — Progress Notes (Signed)
[place] for Heparin Indication: DVT  Allergies  Allergen Reactions  . Other Swelling    Magic mouthwash  . Penicillins Swelling    Has patient had a PCN reaction causing immediate rash, facial/tongue/throat swelling, SOB or lightheadedness with hypotension: No Has patient had a PCN reaction causing severe rash involving mucus membranes or skin necrosis: No Has patient had a PCN reaction that required hospitalization: No Has patient had a PCN reaction occurring within the last 10 years: No If all of the above answers are "NO", then may proceed with Cephalosporin use.    . Ciprofloxacin Diarrhea    GI distress, abd cramping    Patient Measurements: Height: 4\' 11"  (149.9 cm) Weight: 129 lb (58.5 kg) IBW/kg (Calculated) : 43.2 Heparin Dosing Weight: 55 kg  Vital Signs: Temp: 98.4 F (36.9 C) (08/19 0435) Temp Source: Oral (08/19 0435) BP: 137/52 (08/19 0435) Pulse Rate: 78 (08/19 0435)  Labs:  Recent Labs  11/07/16 1023 11/08/16 0410  HGB 8.4* 7.5*  HCT 23.6* 21.4*  PLT 399 353  CREATININE 1.07* 0.94    Estimated Creatinine Clearance: 39 mL/min (by C-G formula based on SCr of 0.94 mg/dL).   Medical History: Past Medical History:  Diagnosis Date  . Anemia   . Anxiety   . Arthritis   . Cancer [facility])    GIST  . Depression   . GERD (gastroesophageal reflux disease)   . 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 History of hiatal hernia   . History of kidney stones   . Hypertension    NO MEDS NOW  . IBS (irritable bowel syndrome)   . Tremors of nervous system    HEAD    Assessment: 76 y/o F admitted with DVT and started on apixaban with last dose of 10 mg this AM. Hematuria reported per RN. Plan is to transition to heparin drip with possible IVC filter pending.   Goal of Therapy:  APTT 68-109 Heparin level 0.3-0.7 units/ml Monitor platelets by anticoagulation protocol: Yes   Plan:  Will  withhold bolus due to apixaban/age/renal function/hematuria. Start heparin infusion at 900 units/hr at 2100 (12 hours after apixaban dose) Check aPTT level in 8 hours and daily while on heparin Continue to monitor H&H and platelets.  Ulice Dash D 11/08/2016,10:54 AM

## 2016-11-08 NOTE — Consult Note (Signed)
[place] CONSULT NOTE  Patient Care Team: Rusty Aus, MD as PCP - General (Internal Medicine) Hollice Espy, MD as Consulting Physician (Urology)  CHIEF COMPLAINTS/PURPOSE OF CONSULTATION:  Metastatic gist/ DVT  HISTORY OF PRESENTING ILLNESS:  Courtney [name] 76 y.o.  female patient with a history of GIST tumor of the abdomen/ pelvis- most recently on second line therapy with Sunitinib. Patient's therapies and hold for the last 2 weeks- given intolerance/likely progression.  More recently patient had cystoscopy/ureteral stenting- for hydronephrosis with Dr. Erlene Quan. Awaiting on the pathology from the cystoscopy.   Patient presents to the hospital for worsening left lower extremity pain. Ultrasound of the lower extremity shows- DVT above and below the knee on the left side. Patient is currently on Eliquis; but noted to have blood in the Foley catheter.   Patient denies any nausea vomiting. Denies any bleeding gums. Denies any chest pain or shortness of breath. Otherwise feels okay.  ROS: A complete 10 point review of system is done which is negative except mentioned above in history of present illness  MEDICAL HISTORY:  Past Medical History:  Diagnosis Date  . Anemia   . Anxiety   . Arthritis   . Cancer [facility])    GIST  . Depression   . GERD (gastroesophageal reflux disease)   . 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 History of hiatal hernia   . History of kidney stones   . Hypertension    NO MEDS NOW  . IBS (irritable bowel syndrome)   . Tremors of nervous system    HEAD    SURGICAL HISTORY: Past Surgical History:  Procedure Laterality Date  . ABDOMINAL HYSTERECTOMY    . BREAST BIOPSY Right   . CATARACT EXTRACTION W/PHACO Right 01/28/2015   Procedure: CATARACT EXTRACTION PHACO AND INTRAOCULAR LENS PLACEMENT (IOC);  Surgeon: Estill Cotta, MD;  Location: [facility] ORS;  Service: Ophthalmology;  Laterality: Right;  [name] 00:59.3 AP%  22.6 CDE 24.69 fluid pack lot #[id number]  . CATARACT EXTRACTION W/PHACO Left 02/11/2015   Procedure: CATARACT EXTRACTION PHACO AND INTRAOCULAR LENS PLACEMENT (IOC);  Surgeon: Estill Cotta, MD;  Location: [facility] ORS;  Service: Ophthalmology;  Laterality: Left;  [name] 01:18 AP% 22.8 CDE 32.38  fluid pack lot # [id number]  . CHOLECYSTECTOMY    . COLON SURGERY     COLOSTOMY AND REVERSAL  . CYSTOSCOPY W/ RETROGRADES Bilateral 11/02/2016   Procedure: CYSTOSCOPY WITH RETROGRADE PYELOGRAM;  Surgeon: Hollice Espy, MD;  Location: [facility] ORS;  Service: Urology;  Laterality: Bilateral;  . CYSTOSCOPY WITH STENT PLACEMENT Bilateral 11/02/2016   Procedure: CYSTOSCOPY WITH STENT PLACEMENT;  Surgeon: Hollice Espy, MD;  Location: [facility] ORS;  Service: Urology;  Laterality: Bilateral;  . STOMACH SURGERY     CANCER  . TRANSURETHRAL RESECTION OF BLADDER TUMOR N/A 11/02/2016   Procedure: TRANSURETHRAL RESECTION OF BLADDER TUMOR (TURBT) >5CM;  Surgeon: Hollice Espy, MD;  Location: [facility] ORS;  Service: Urology;  Laterality: N/A;    SOCIAL HISTORY: Social History   Social History  . Marital status: Married    Spouse name: N/A  . Number of children: N/A  . Years of education: N/A   Occupational History  . Not on file.   Social History Main Topics  . Smoking status: Never Smoker  . Smokeless tobacco: Never Used  . Alcohol use No  . Drug use: No  . Sexual activity: Not on file   Other Topics Concern  . Not on file  Social History Narrative  . No narrative on file    FAMILY HISTORY: Family History  Problem Relation Age of Onset  . Breast cancer Maternal Grandmother   . CAD Mother   . CAD Father     ALLERGIES:  is allergic to other; penicillins; and ciprofloxacin.  MEDICATIONS:  Current Facility-Administered Medications  Medication Dose Route Frequency Provider Last Rate Last Dose  . acetaminophen (TYLENOL) tablet 650 mg  650 mg Oral Q6H PRN Loletha Grayer, MD       Or  .  acetaminophen (TYLENOL) suppository 650 mg  650 mg Rectal Q6H PRN Loletha Grayer, MD      . cholecalciferol (VITAMIN D) tablet 1,000 Units  1,000 Units Oral Daily Loletha Grayer, MD   1,000 Units at 11/08/16 0909  . cyanocobalamin tablet 1,000 mcg  1,000 mcg Oral Daily Loletha Grayer, MD   1,000 mcg at 11/08/16 0910  . ferrous sulfate tablet 325 mg  325 mg Oral TID WC Loletha Grayer, MD   325 mg at 11/08/16 0909  . heparin ADULT infusion 100 units/mL (25000 units/275mL sodium chloride 0.45%)  900 Units/hr Intravenous Continuous Napoleon Form, RPH      . morphine 2 MG/ML injection 1 mg  1 mg Intravenous Q4H PRN Hugelmeyer, Alexis, DO   1 mg at 11/08/16 0315  . morphine 2 MG/ML injection 2 mg  2 mg Intravenous Once Nena Polio, MD      . multivitamin with minerals tablet 1 tablet  1 tablet Oral Daily Loletha Grayer, MD   1 tablet at 11/08/16 0909  . multivitamin-lutein (OCUVITE-LUTEIN) capsule 2 capsule  2 capsule Oral Daily Loletha Grayer, MD   2 capsule at 11/08/16 0910  . oxyCODONE (Oxy IR/ROXICODONE) immediate release tablet 5 mg  5 mg Oral Q6H PRN Loletha Grayer, MD   5 mg at 11/08/16 1696  . potassium chloride SA (K-DUR,KLOR-CON) CR tablet 20 mEq  20 mEq Oral Daily Loletha Grayer, MD   20 mEq at 11/08/16 0910      .  PHYSICAL EXAMINATION:  Vitals:   11/08/16 0057 11/08/16 0435  BP: (!) 143/60 (!) 137/52  Pulse: 84 78  Resp: 18 20  Temp: 98.1 F (36.7 C) 98.4 F (36.9 C)  SpO2: 100% 100%   Filed Weights   11/07/16 0912  Weight: 129 lb (58.5 kg)    GENERAL: Well-nourished well-developed; Thin built.. Alert, no distress and comfortable.   Alone.  EYES: no pallor or icterus OROPHARYNX: no thrush or ulceration. NECK: supple, no masses felt LYMPH:  no palpable lymphadenopathy in the cervical, axillary or inguinal regions LUNGS: decreased breath sounds to auscultation at bases and  No wheeze or crackles HEART/CVS: regular rate & rhythm and no murmurs;  swelling/ pain of Left LE.  ABDOMEN: abdomen soft, non-tender and normal bowel sounds Musculoskeletal:no cyanosis of digits and no clubbing  PSYCH: alert & oriented x 3 with fluent speech NEURO: no focal motor/sensory deficits SKIN:  no rashes or significant lesions  LABORATORY DATA:  I have reviewed the data as listed Lab Results  Component Value Date   WBC 6.9 11/08/2016   HGB 7.5 (L) 11/08/2016   HCT 21.4 (L) 11/08/2016   MCV 106.9 (H) 11/08/2016   PLT 353 11/08/2016    Recent Labs  08/03/16 1315 10/06/16 1316 10/27/16 0753 11/07/16 1023 11/08/16 0410  NA 138 138 141 135 138  K 3.7 4.0 3.8 3.1* 4.0  CL 108 105 109 101 106  CO2 22 26 23  24 25  GLUCOSE 113* 105* 98 100* 96  BUN 13 14 23* 15 13  CREATININE 0.96 1.36* 1.84* 1.07* 0.94  CALCIUM 9.0 8.9 9.6 8.4* 7.7*  GFRNONAA 56* 36* 25* 49* 57*  GFRAA >60 42* 29* 57* >60  PROT 5.9* 5.9*  --  6.1*  --   ALBUMIN 3.7 3.4*  --  3.3*  --   AST 31 31  --  27  --   ALT 17 22  --  27  --   ALKPHOS 44 47  --  49  --   BILITOT 0.7 0.5  --  0.8  --     RADIOGRAPHIC STUDIES: I have personally reviewed the radiological images as listed and agreed with the findings in the report. Ct Abdomen Pelvis W Contrast  Result Date: 11/07/2016 CLINICAL DATA:  Pt states she's been having back and abdominal pain, pain increases with movement, pt states last Monday bladder stints placed, hx epidural, hernia repair, GB, GERD, hyst. EXAM: CT ABDOMEN AND PELVIS WITH CONTRAST TECHNIQUE: Multidetector CT imaging of the abdomen and pelvis was performed using the standard protocol following bolus administration of intravenous contrast. CONTRAST:  62mL ISOVUE-300 IOPAMIDOL (ISOVUE-300) INJECTION 61% COMPARISON:  06/01/2016 FINDINGS: Lower chest: 4 mm nodule, left lower lobe, image 4, series 4, new since the prior CT. Minor dependent subsegmental atelectasis. No acute findings. Hepatobiliary: No focal liver abnormality is seen. Status post cholecystectomy.  No biliary dilatation. Pancreas: Unremarkable. No pancreatic ductal dilatation or surrounding inflammatory changes. Spleen: Unremarkable. Adrenals/Urinary Tract: No adrenal masses. Bilateral ureteral stents appear well-positioned. Mild left and mild-to-moderate right renal collecting system dilation. Small low-density mass consistent with a cyst from the superior margin of the left kidney, measuring 7 mm. No other renal masses or lesions. No stones. There is irregular wall thickening of the posterior inferior bladder. Bladder is partly decompressed by Foley catheter. Stomach/Bowel: Moderate size hiatal hernia. Stomach otherwise unremarkable. There has been a previous partial colon resection. There are right and left: Bowel anastomosis staple lines. A few left colon diverticula noted. There is no evidence diverticulitis or other colonic inflammatory process. The small bowel is unremarkable. Normal appendix is visualized. There is a 16 mm nodule adjacent to the left transverse colon in the left upper quadrant, within the peritoneal cavity. Vascular/Lymphatic: There is bulky retroperitoneal adenopathy. Several reference measurements were made. An enlarged node or confluence of lymph nodes surrounds the aorta at the level of the of the renal vessels. An aortocaval portion of this measures 2.4 cm in short axis. There are enlarged pelvic lymph nodes. Largest is a left external iliac chain node measuring 3.7 x 2.8 cm. There is an enlarged left inguinal lymph node measuring 1.6 cm in short axis. There is aortic and branch vessel atherosclerotic calcifications. No aneurysm or dissection. The inferior vena cava is compressed have the level of the retroperitoneal adenopathy. There is also compression of common iliac veins. There may be thrombus in both common femoral veins versus mixed unenhanced and enhanced blood. Reproductive: Status post hysterectomy. No adnexal masses. Other: Trace ascites is seen inferior to the pancreas  and along the left anterior pararenal fascia, and collecting in the posterior pelvic recess. Musculoskeletal: No acute fracture. No osteoblastic or osteolytic lesions. IMPRESSION: 1. There is bulky retroperitoneal, pelvic and left inguinal adenopathy, new since the prior CT. Nodes compress the inferior vena cava and iliac veins. There may be thrombus in the common femoral veins. Consider follow-up bilateral lower extremity venous Doppler evaluation. 2.  Bilateral ureteral stents are well positioned. There is mild to moderate right and mild left hydronephrosis. 3. Irregular wall thickening of the posterior and inferior bladder. This may be inflammatory or reflect neoplasm. 4. Moderate hiatal hernia. 5. 16 mm soft tissue nodule in the left upper quadrant anterior peritoneal cavity a reflect an enlarged lymph node or a neoplastic peritoneal deposit. 6. Trace ascites. 7. Aortic atherosclerosis. Electronically Signed   By: Lajean Manes M.D.   On: 11/07/2016 13:47   US Renal  Result Date: 10/27/2016 CLINICAL DATA:  Acute renal failure EXAM: RENAL / URINARY TRACT ULTRASOUND COMPLETE COMPARISON:  None. FINDINGS: Right Kidney: Length: 10.9 cm. Echogenicity and renal cortical thickness are within normal limits. No mass or perinephric fluid visualized. There is moderate hydronephrosis on the right. No sonographically demonstrable calculus or ureterectasis. Left Kidney: Length: 9.5 cm. Echogenicity and renal cortical thickness are within normal limits. No mass or perinephric fluid visualized. There is moderate hydronephrosis on the left. No sonographically demonstrable calculus or ureterectasis. Bladder: There is a hypoechoic lesion along the posterior aspect of the urinary bladder which shows moderate vascularity measuring 4.6 x 2.0 cm. This mass may be infiltrating the posterior bladder wall. Note that there is slight postvoid residual in the urinary bladder. IMPRESSION: Hypoechoic apparent mass along the posterior aspect  of the urinary bladder which may be infiltrating into the posterior bladder wall. There is moderate hydronephrosis bilaterally, possibly due to a degree of obstruction of the ureterovesical junctions from the apparent bladder tumor. There is mild postvoid residual in the urinary bladder. These results will be called to the ordering clinician or representative by the Radiologist Assistant, and communication documented in the PACS or zVision Dashboard. Electronically Signed   By: Lowella Grip III M.D.   On: 10/27/2016 14:45   US Venous Img Lower Bilateral  Result Date: 11/07/2016 CLINICAL DATA:  Left leg pain. Possible femoral vein thrombosis on CT. EXAM: BILATERAL LOWER EXTREMITY VENOUS DOPPLER ULTRASOUND TECHNIQUE: Gray-scale sonography with graded compression, as well as color Doppler and duplex ultrasound were performed to evaluate the lower extremity deep venous systems from the level of the common femoral vein and including the common femoral, femoral, profunda femoral, popliteal and calf veins including the posterior tibial, peroneal and gastrocnemius veins when visible. The superficial great saphenous vein was also interrogated. Spectral Doppler was utilized to evaluate flow at rest and with distal augmentation maneuvers in the common femoral, femoral and popliteal veins. COMPARISON:  None. FINDINGS: RIGHT LOWER EXTREMITY Common Femoral Vein: No evidence of thrombus. Normal compressibility, respiratory phasicity and response to augmentation. Saphenofemoral Junction: No evidence of thrombus. Profunda Femoral Vein: No evidence of thrombus. Femoral Vein: No evidence of thrombus. Popliteal Vein: No evidence of thrombus. Calf Veins: No evidence of thrombus. Superficial Great Saphenous Vein: No evidence of thrombus. LEFT LOWER EXTREMITY Common Femoral Vein: Nonocclusive luminal clot is seen within the partially compressible vessel. There is neighboring bulky adenopathy in this patient with known malignancy.  Saphenofemoral Junction: No evidence of thrombus. Profunda Femoral Vein: No evidence of thrombus. Femoral Vein: No evidence of thrombus. Popliteal Vein: No evidence of thrombus. Calf Veins: Thrombus within a posterior tibial vein, occlusive. Other Findings: Diffusely monophasic flow correlating with pelvic retroperitoneal adenopathy impinging on the iliac vein. IMPRESSION: 1. Confirmed left lower extremity DVT, nonocclusive in the common femoral vein and occlusive in the posterior tibial vein. 2. 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 Electronically Signed   By:  Monte Fantasia M.D.   On: 11/07/2016 16:10    ASSESSMENT & PLAN:   # 76 year old female patient with history of Gist tumor abdomen/pelvis- recently noted to have bilateral hydronephrosis-n currently admitted to the hospital for left lower extremity pain/swelling-diagnosed to have DVT.  # Left lower extremity swelling/pain- ultrasound showed thrombosis above and below the knee. Likely secondary to malignancy/ mechanical obstruction at level of IVC. Patient currently on Eliquis. Given the bleeding and would recommend switching over to IV heparin- which could easily be reversed if patient continues to have significant bleeding.  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 Currently on hold- given intolerance/ and also concerning for progression. Awaiting tissue diagnosis [C discussion below]- for the next plan of treatment. Will discuss with pathology.   # Bilateral hydronephrosis- secondary to tumor infiltration on recent cystoscopy. Patient status post resection/ stenting [Dr. Brandon]. Awaiting on tissue diagnosis.   # Hematuria- likely secondary to recent procedure/tumor- on anticoagulation. Discussed with urology.  # Anemia- multifactorial- underlying disease/hematuria. Agree with 1 unit of PRBC transfusion. We will check iron studies;  plan IV iron if needed.  Thank you Dr.Modi for allowing me to participate in the care of your pleasant patient. Please do not hesitate to contact me with questions or concerns in the interim. All questions were answered. The patient knows to call the clinic with any problems, questions or concerns.    Cammie Sickle, MD 11/08/2016 11:55 AM

## 2016-11-09 ENCOUNTER — Inpatient Hospital Stay: Payer: PPO

## 2016-11-09 DIAGNOSIS — R109 Unspecified abdominal pain: Secondary | ICD-10-CM

## 2016-11-09 DIAGNOSIS — R591 Generalized enlarged lymph nodes: Secondary | ICD-10-CM

## 2016-11-09 DIAGNOSIS — I82402 Acute embolism and thrombosis of unspecified deep veins of left lower extremity: Secondary | ICD-10-CM

## 2016-11-09 DIAGNOSIS — R319 Hematuria, unspecified: Secondary | ICD-10-CM

## 2016-11-09 LAB — CBC
HCT: 28.1 % — ABNORMAL LOW (ref 35.0–47.0)
Hemoglobin: 10 g/dL — ABNORMAL LOW (ref 12.0–16.0)
MCH: 36.1 pg — ABNORMAL HIGH (ref 26.0–34.0)
MCHC: 35.5 g/dL (ref 32.0–36.0)
MCV: 101.5 fL — ABNORMAL HIGH (ref 80.0–100.0)
PLATELETS: 375 10*3/uL (ref 150–440)
RBC: 2.77 MIL/uL — AB (ref 3.80–5.20)
RDW: 17.9 % — AB (ref 11.5–14.5)
WBC: 10.1 10*3/uL (ref 3.6–11.0)

## 2016-11-09 LAB — TYPE AND SCREEN
ABO/RH(D): A POS
ANTIBODY SCREEN: NEGATIVE
UNIT DIVISION: 0

## 2016-11-09 LAB — HEPARIN LEVEL (UNFRACTIONATED)
HEPARIN UNFRACTIONATED: 3.06 [IU]/mL — AB (ref 0.30–0.70)
HEPARIN UNFRACTIONATED: 1.85 [IU]/mL — AB (ref 0.30–0.70)

## 2016-11-09 LAB — APTT
APTT: 52 s — AB (ref 24–36)
aPTT: 86 seconds — ABNORMAL HIGH (ref 24–36)
aPTT: 111 seconds — ABNORMAL HIGH (ref 24–36)

## 2016-11-09 LAB — ABO/RH: ABO/RH(D): A POS

## 2016-11-09 LAB — BPAM RBC
Blood Product Expiration Date: 201808302359
ISSUE DATE / TIME: 201808191359
Unit Type and Rh: 6200

## 2016-11-09 LAB — VITAMIN B12: VITAMIN B 12: 2746 pg/mL — AB (ref 180–914)

## 2016-11-09 MED ORDER — RISPERIDONE 0.25 MG PO TABS
0.2500 mg | ORAL_TABLET | Freq: Two times a day (BID) | ORAL | Status: DC | PRN
  Administered 2016-11-09: 0.25 mg via ORAL
  Filled 2016-11-09 (×2): qty 1

## 2016-11-09 MED ORDER — HYDRALAZINE HCL 20 MG/ML IJ SOLN
10.0000 mg | INTRAMUSCULAR | Status: DC | PRN
  Administered 2016-11-09 – 2016-11-10 (×2): 10 mg via INTRAVENOUS
  Filled 2016-11-09 (×2): qty 1

## 2016-11-09 MED ORDER — LORAZEPAM 2 MG/ML IJ SOLN
0.5000 mg | Freq: Once | INTRAMUSCULAR | Status: AC
  Administered 2016-11-09: 0.5 mg via INTRAVENOUS
  Filled 2016-11-09: qty 1

## 2016-11-09 NOTE — Progress Notes (Signed)
Courtney [name]   DOB:[birth year]   TJ#:[id number]    Subjective: Patient confused. She pulled her IV line. Denies any pain. Continues to have blood in the Foley catheter.  Review of system: Difficult to assess.  Objective:  Vitals:   11/09/16 0748 11/09/16 1219  BP: 140/82 (!) 143/68  Pulse: (!) 110 (!) 108  Resp: 16 18  Temp:  98.8 F (37.1 C)  SpO2: 98% 99%     Intake/Output Summary (Last 24 hours) at 11/09/16 1749 Last data filed at 11/09/16 1711  Gross per 24 hour  Intake           329.65 ml  Output             1450 ml  Net         -1120.35 ml    GENERAL: Well-nourished well-developed; Thin built.. Alert, no distress and comfortable.   Alone. Confused.  EYES: no pallor or icterus OROPHARYNX: no thrush or ulceration. NECK: supple, no masses felt LYMPH:  no palpable lymphadenopathy in the cervical, axillary or inguinal regions LUNGS: decreased breath sounds to auscultation at bases and  No wheeze or crackles HEART/CVS: regular rate & rhythm and no murmurs; swelling/ pain of Left LE.  ABDOMEN: abdomen soft, non-tender and normal bowel sounds; Foley catheter/bag- bloody Musculoskeletal:no cyanosis of digits and no clubbing  PSYCH: alert & oriented x 0.  NEURO: no focal motor/sensory deficits SKIN:  no rashes or significant lesions   Labs:  Lab Results  Component Value Date   WBC 10.1 11/09/2016   HGB 10.0 (L) 11/09/2016   HCT 28.1 (L) 11/09/2016   MCV 101.5 (H) 11/09/2016   PLT 375 11/09/2016   NEUTROABS 6.3 11/07/2016    Lab Results  Component Value Date   NA 138 11/08/2016   K 4.0 11/08/2016   CL 106 11/08/2016   CO2 25 11/08/2016    Studies:  Ct Head Wo Contrast  Result Date: 11/09/2016 CLINICAL DATA:  Altered mental status EXAM: CT HEAD WITHOUT CONTRAST TECHNIQUE: Contiguous axial images were obtained from the base of the skull through the vertex without intravenous contrast. COMPARISON:  02/16/2014 FINDINGS: Brain: No acute intracranial abnormality.  Specifically, no hemorrhage, hydrocephalus, mass lesion, acute infarction, or significant intracranial injury. There is atrophy and chronic small vessel disease changes. Vascular: No hyperdense vessel or unexpected calcification. Skull: No acute calvarial abnormality. Sinuses/Orbits: Visualized paranasal sinuses and mastoids clear. Orbital soft tissues unremarkable. Other: None IMPRESSION: No acute intracranial abnormality. Atrophy, chronic microvascular disease. Electronically Signed   By: Rolm Baptise M.D.   On: 11/09/2016 10:03    Assessment & Plan:   # 76 year old female patient with history of Gist tumor abdomen/pelvis-currently in the hospital for left lower extremity DVT  # Left lower extremity DVT-  Likely secondary to malignancy/ mechanical obstruction at level of IVC. On IV heparin.   # Hematuria- status post recent stenting for bilateral hydronephrosis; TURBT; urology following.  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 Currently on hold- given intolerance/ and also concerning for progression.  # Anemia- multifactorial- underlying disease/hematuria. Status post blood transfusion hemoglobin around 10.  # Delirium/acute confusion- CT brain negative for any bleed. Risperdal ordered.   Cammie Sickle, MD 11/09/2016  5:49 PM

## 2016-11-09 NOTE — Progress Notes (Signed)
[place] for Heparin Indication: DVT  Allergies  Allergen Reactions  . Other Swelling    Magic mouthwash  . Penicillins Swelling    Has patient had a PCN reaction causing immediate rash, facial/tongue/throat swelling, SOB or lightheadedness with hypotension: No Has patient had a PCN reaction causing severe rash involving mucus membranes or skin necrosis: No Has patient had a PCN reaction that required hospitalization: No Has patient had a PCN reaction occurring within the last 10 years: No If all of the above answers are "NO", then may proceed with Cephalosporin use.    . Ciprofloxacin Diarrhea    GI distress, abd cramping    Patient Measurements: Height: 4\' 11"  (149.9 cm) Weight: 129 lb (58.5 kg) IBW/kg (Calculated) : 43.2 Heparin Dosing Weight: 55 kg  Vital Signs: Temp: 98.4 F (36.9 C) (08/20 0430) Temp Source: Oral (08/20 0430) BP: 181/78 (08/20 0430) Pulse Rate: 88 (08/20 0430)  Labs:  Recent Labs  11/07/16 1023 11/08/16 0410 11/08/16 1221 11/08/16 1802 11/09/16 0457  HGB 8.4* 7.5*  --  9.7* 10.0*  HCT 23.6* 21.4*  --  28.0* 28.1*  PLT 399 353  --   --  375  APTT  --   --  40*  --  86*  LABPROT  --   --  22.7*  --   --   INR  --   --  1.97  --   --   HEPARINUNFRC  --   --  >3.60*  --  3.06*  CREATININE 1.07* 0.94  --   --   --     Estimated Creatinine Clearance: 39 mL/min (by C-G formula based on SCr of 0.94 mg/dL).   Medical History: Past Medical History:  Diagnosis Date  . Anemia   . Anxiety   . Arthritis   . Cancer [facility])    GIST  . Depression   . GERD (gastroesophageal reflux disease)   . 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 History of hiatal hernia   . History of kidney stones   . Hypertension    NO MEDS NOW  . IBS (irritable bowel syndrome)   . Tremors of nervous system    HEAD    Assessment: 76 y/o F admitted with DVT and started on apixaban with last dose of 10 mg  this AM. Hematuria reported per RN. Plan is to transition to heparin drip with possible IVC filter pending.   Goal of Therapy:  APTT 68-109 Heparin level 0.3-0.7 units/ml Monitor platelets by anticoagulation protocol: Yes   Plan:  Will withhold bolus due to apixaban/age/renal function/hematuria. Start heparin infusion at 900 units/hr at 2100 (12 hours after apixaban dose) Check aPTT level in 8 hours and daily while on heparin Continue to monitor H&H and platelets.  8/20 @ 0500 HL/aPTT 3.06/86. HL still elevated but aPTT in range. Will continue current rate and will recheck next HL/aPTT @ 1300, will continue to dose off of aPTT until HL/aPTT correlate.  Tobie Lords, PharmD, BCPS Clinical Pharmacist 11/09/2016

## 2016-11-09 NOTE — Plan of Care (Signed)
AM meds given - Risperdone (PO) given d/t patient needing to have CT (head) completed. Sitter will follow to assist as needed.  CT also informed that patient currently has no IV.  One failed attempt and second will be attempted once returns to the floor.

## 2016-11-09 NOTE — Progress Notes (Signed)
[place] [facility] [place] NAME: Courtney [name]    MR#:  [id number]  DATE OF BIRTH:  [birth year]  SUBJECTIVE:  Patient with confusion started this am   REVIEW OF SYSTEMS:    Review of Systems  Confused this am   ++HEMATURIA noted  Tolerating Diet: yes      DRUG ALLERGIES:   Allergies  Allergen Reactions  . Other Swelling    Magic mouthwash  . Penicillins Swelling    Has patient had a PCN reaction causing immediate rash, facial/tongue/throat swelling, SOB or lightheadedness with hypotension: No Has patient had a PCN reaction causing severe rash involving mucus membranes or skin necrosis: No Has patient had a PCN reaction that required hospitalization: No Has patient had a PCN reaction occurring within the last 10 years: No If all of the above answers are "NO", then may proceed with Cephalosporin use.    . Ciprofloxacin Diarrhea    GI distress, abd cramping    VITALS:  Blood pressure 140/82, pulse (!) 110, temperature 98.4 F (36.9 C), temperature source Oral, resp. rate 16, height 4\' 11"  (1.499 m), weight 58.5 kg (129 lb), SpO2 98 %.  PHYSICAL EXAMINATION:  Constitutional: Appears well-developed and well-nourished. No distress. HENT: Normocephalic. Marland Kitchen Oropharynx is clear and moist.  Eyes: Conjunctivae and EOM are normal. PERRLA, no scleral icterus.  Neck: Normal ROM. Neck supple. No JVD. No tracheal deviation. CVS: RRR, S1/S2 +, no murmurs, no gallops, no carotid bruit.  Pulmonary: Effort and breath sounds normal, no stridor, rhonchi, wheezes, rales.  Abdominal: Soft. BS +,  no distension, tenderness, rebound or guarding.  Musculoskeletal: Normal range of motion. No edema and no tenderness.  Neuro: Alert Oriented to name only CN 2-12 grossly intact. No focal deficits. Skin: Skin is warm and dry. No rash noted. Psychiatric: Normal mood and affect.      LABORATORY PANEL:   CBC  Recent Labs Lab 11/09/16 0457  WBC 10.1  HGB  10.0*  HCT 28.1*  PLT 375   ------------------------------------------------------------------------------------------------------------------  Chemistries   Recent Labs Lab 11/07/16 1023 11/08/16 0410  NA 135 138  K 3.1* 4.0  CL 101 106  CO2 24 25  GLUCOSE 100* 96  BUN 15 13  CREATININE 1.07* 0.94  CALCIUM 8.4* 7.7*  MG 1.3*  --   AST 27  --   ALT 27  --   ALKPHOS 49  --   BILITOT 0.8  --    ------------------------------------------------------------------------------------------------------------------  Cardiac Enzymes No results for input(s): TROPONINI in the last 168 hours. ------------------------------------------------------------------------------------------------------------------  RADIOLOGY:  Ct Abdomen Pelvis W Contrast  Result Date: 11/07/2016 CLINICAL DATA:  Pt states she's been having back and abdominal pain, pain increases with movement, pt states last Monday bladder stints placed, hx epidural, hernia repair, GB, GERD, hyst. EXAM: CT ABDOMEN AND PELVIS WITH CONTRAST TECHNIQUE: Multidetector CT imaging of the abdomen and pelvis was performed using the standard protocol following bolus administration of intravenous contrast. CONTRAST:  14mL ISOVUE-300 IOPAMIDOL (ISOVUE-300) INJECTION 61% COMPARISON:  06/01/2016 FINDINGS: Lower chest: 4 mm nodule, left lower lobe, image 4, series 4, new since the prior CT. Minor dependent subsegmental atelectasis. No acute findings. Hepatobiliary: No focal liver abnormality is seen. Status post cholecystectomy. No biliary dilatation. Pancreas: Unremarkable. No pancreatic ductal dilatation or surrounding inflammatory changes. Spleen: Unremarkable. Adrenals/Urinary Tract: No adrenal masses. Bilateral ureteral stents appear well-positioned. Mild left and mild-to-moderate right renal collecting system dilation. Small low-density mass consistent with a cyst from  the superior margin of the left kidney, measuring 7 mm. No other renal masses  or lesions. No stones. There is irregular wall thickening of the posterior inferior bladder. Bladder is partly decompressed by Foley catheter. Stomach/Bowel: Moderate size hiatal hernia. Stomach otherwise unremarkable. There has been a previous partial colon resection. There are right and left: Bowel anastomosis staple lines. A few left colon diverticula noted. There is no evidence diverticulitis or other colonic inflammatory process. The small bowel is unremarkable. Normal appendix is visualized. There is a 16 mm nodule adjacent to the left transverse colon in the left upper quadrant, within the peritoneal cavity. Vascular/Lymphatic: There is bulky retroperitoneal adenopathy. Several reference measurements were made. An enlarged node or confluence of lymph nodes surrounds the aorta at the level of the of the renal vessels. An aortocaval portion of this measures 2.4 cm in short axis. There are enlarged pelvic lymph nodes. Largest is a left external iliac chain node measuring 3.7 x 2.8 cm. There is an enlarged left inguinal lymph node measuring 1.6 cm in short axis. There is aortic and branch vessel atherosclerotic calcifications. No aneurysm or dissection. The inferior vena cava is compressed have the level of the retroperitoneal adenopathy. There is also compression of common iliac veins. There may be thrombus in both common femoral veins versus mixed unenhanced and enhanced blood. Reproductive: Status post hysterectomy. No adnexal masses. Other: Trace ascites is seen inferior to the pancreas and along the left anterior pararenal fascia, and collecting in the posterior pelvic recess. Musculoskeletal: No acute fracture. No osteoblastic or osteolytic lesions. IMPRESSION: 1. There is bulky retroperitoneal, pelvic and left inguinal adenopathy, new since the prior CT. Nodes compress the inferior vena cava and iliac veins. There may be thrombus in the common femoral veins. Consider follow-up bilateral lower extremity  venous Doppler evaluation. 2. Bilateral ureteral stents are well positioned. There is mild to moderate right and mild left hydronephrosis. 3. Irregular wall thickening of the posterior and inferior bladder. This may be inflammatory or reflect neoplasm. 4. Moderate hiatal hernia. 5. 16 mm soft tissue nodule in the left upper quadrant anterior peritoneal cavity a reflect an enlarged lymph node or a neoplastic peritoneal deposit. 6. Trace ascites. 7. Aortic atherosclerosis. Electronically Signed   By: Lajean Manes M.D.   On: 11/07/2016 13:47   US Venous Img Lower Bilateral  Result Date: 11/07/2016 CLINICAL DATA:  Left leg pain. Possible femoral vein thrombosis on CT. EXAM: BILATERAL LOWER EXTREMITY VENOUS DOPPLER ULTRASOUND TECHNIQUE: Gray-scale sonography with graded compression, as well as color Doppler and duplex ultrasound were performed to evaluate the lower extremity deep venous systems from the level of the common femoral vein and including the common femoral, femoral, profunda femoral, popliteal and calf veins including the posterior tibial, peroneal and gastrocnemius veins when visible. The superficial great saphenous vein was also interrogated. Spectral Doppler was utilized to evaluate flow at rest and with distal augmentation maneuvers in the common femoral, femoral and popliteal veins. COMPARISON:  None. FINDINGS: RIGHT LOWER EXTREMITY Common Femoral Vein: No evidence of thrombus. Normal compressibility, respiratory phasicity and response to augmentation. Saphenofemoral Junction: No evidence of thrombus. Profunda Femoral Vein: No evidence of thrombus. Femoral Vein: No evidence of thrombus. Popliteal Vein: No evidence of thrombus. Calf Veins: No evidence of thrombus. Superficial Great Saphenous Vein: No evidence of thrombus. LEFT LOWER EXTREMITY Common Femoral Vein: Nonocclusive luminal clot is seen within the partially compressible vessel. There is neighboring bulky adenopathy in this patient with  known malignancy. Saphenofemoral Junction:  No evidence of thrombus. Profunda Femoral Vein: No evidence of thrombus. Femoral Vein: No evidence of thrombus. Popliteal Vein: No evidence of thrombus. Calf Veins: Thrombus within a posterior tibial vein, occlusive. Other Findings: Diffusely monophasic flow correlating with pelvic retroperitoneal adenopathy impinging on the iliac vein. IMPRESSION: 1. Confirmed left lower extremity DVT, nonocclusive in the common femoral vein and occlusive in the posterior tibial vein. 2. 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 Electronically Signed   By: Monte Fantasia M.D.   On: 11/07/2016 16:10     ASSESSMENT AND PLAN:   [name] with history of progressive metastatic GIST dx 02/2014 and recent diagnosis of bladder tumor s/p TURBT and bilateral placement who presented with left leg pain and found to have DVT.   1. Left lower extremity DVT, nonocclusive in the common femoral vein and occlusive in the posterior tibial vein:due to underlying malignancy Due to concern of bleeding she is on heparin gtt. May need IVC filter   2. Acute on chronic blood loss anemia with recent diagnosis of bladder tumor status post TURBT Status post 1 unit PRBC Hemoglobin stable today CBC for a.m. Continue ferrous sulfate  3.history of GISTT and bladder tumor:  Appreciate urology and oncology evaluation  4.hypokalemia: Resolved 5. Delirium/acute encephalopathy: Continued oriented patient Bedside sitter Risperdal when necessary ordered   Management plans discussed with the nursing and Dr B. CODE STATUS: full  TOTAL TIME TAKING CARE OF THIS PATIENT: 24 minutes.     POSSIBLE D/C 2 days, DEPENDING ON CLINICAL CONDITION.   Fransico Sciandra M.D on 11/09/2016 at 8:47 AM  Between 7am to 6pm - Pager - [phone] After 6pm go to [email or web address] - password [email or web address] [facility]  Office   [phone]  CC: Primary care physician; Rusty Aus, MD  Note: This dictation was prepared with Dragon dictation along with smaller phrase technology. Any transcriptional errors that result from this process are unintentional.

## 2016-11-09 NOTE — Progress Notes (Signed)
RN called MD last night for increased confusion and continuing to remove IVs. New orders given.

## 2016-11-09 NOTE — Progress Notes (Signed)
Urology Consult Follow Up  Subjective: Continues to have maroon colored urine without clots, draining well. Remains confused today, CT of the head negative. Sitter at bedside.  Anti-infectives: Anti-infectives    None      Current Facility-Administered Medications  Medication Dose Route Frequency Provider Last Rate Last Dose  . acetaminophen (TYLENOL) tablet 650 mg  650 mg Oral Q6H PRN Loletha Grayer, MD       Or  . acetaminophen (TYLENOL) suppository 650 mg  650 mg Rectal Q6H PRN Loletha Grayer, MD      . cholecalciferol (VITAMIN D) tablet 1,000 Units  1,000 Units Oral Daily Loletha Grayer, MD   1,000 Units at 11/09/16 [phone]  . cyanocobalamin tablet 1,000 mcg  1,000 mcg Oral Daily Loletha Grayer, MD   1,000 mcg at 11/09/16 6440  . ferrous sulfate tablet 325 mg  325 mg Oral TID WC Loletha Grayer, MD   325 mg at 11/09/16 3474  . heparin ADULT infusion 100 units/mL (25000 units/278mL sodium chloride 0.45%)  1,000 Units/hr Intravenous Continuous Larene Beach, RPH 10 mL/hr at 11/09/16 1434 1,000 Units/hr at 11/09/16 1434  . hydrALAZINE (APRESOLINE) injection 10 mg  10 mg Intravenous Q4H PRN Saundra Shelling, MD   10 mg at 11/09/16 0554  . morphine 2 MG/ML injection 1 mg  1 mg Intravenous Q4H PRN Hugelmeyer, Alexis, DO   1 mg at 11/08/16 2007  . morphine 2 MG/ML injection 2 mg  2 mg Intravenous Once Nena Polio, MD      . multivitamin with minerals tablet 1 tablet  1 tablet Oral Daily Loletha Grayer, MD   1 tablet at 11/09/16 [phone]  . multivitamin-lutein (OCUVITE-LUTEIN) capsule 2 capsule  2 capsule Oral Daily Loletha Grayer, MD   2 capsule at 11/09/16 6387  . oxyCODONE (Oxy IR/ROXICODONE) immediate release tablet 5 mg  5 mg Oral Q6H PRN Loletha Grayer, MD   5 mg at 11/09/16 0555  . potassium chloride SA (K-DUR,KLOR-CON) CR tablet 20 mEq  20 mEq Oral Daily Loletha Grayer, MD   20 mEq at 11/09/16 5643  . risperiDONE (RISPERDAL) tablet 0.25 mg  0.25 mg Oral BID PRN Bettey Costa, MD   0.25 mg at 11/09/16 0936     Objective: Vital signs in last 24 hours: Temp:  [98.4 F (36.9 C)-98.8 F (37.1 C)] 98.8 F (37.1 C) (08/20 1219) Pulse Rate:  [88-110] 108 (08/20 1219) Resp:  [16-20] 18 (08/20 1219) BP: (136-181)/(63-82) 143/68 (08/20 1219) SpO2:  [98 %-99 %] 99 % (08/20 1219)  Intake/Output from previous day: 08/19 0701 - 08/20 0700 In: 611.1 [P.O.:240; I.V.:79.7; Blood:291.4] Out: 1100 [Urine:1100] Intake/Output this shift: Total I/O In: 265 [P.O.:265] Out: 800 [Urine:800]   Physical Exam  Constitutional: She is well-developed, well-nourished, and in no distress.  HENT:  Head: Normocephalic and atraumatic.  Abdominal: Soft. She exhibits no distension. There is no tenderness.  Genitourinary:  Genitourinary Comments: Foley catheter in place, secured to the left thigh draining [place] colored urine without clots.  Neurological: She is alert.  Oriented 2. Believes she is in [facility], oriented to self, and mostly time (2018).  Skin: Skin is warm.  Psychiatric:  Mildly paranoid    Lab Results:   Recent Labs  11/08/16 0410 11/08/16 1802 11/09/16 0457  WBC 6.9  --  10.1  HGB 7.5* 9.7* 10.0*  HCT 21.4* 28.0* 28.1*  PLT 353  --  375   BMET  Recent Labs  11/07/16 1023 11/08/16 0410  NA 135  138  K 3.1* 4.0  CL 101 106  CO2 24 25  GLUCOSE 100* 96  BUN 15 13  CREATININE 1.07* 0.94  CALCIUM 8.4* 7.7*   PT/INR  Recent Labs  11/08/16 1221  LABPROT 22.7*  INR 1.97   ABG No results for input(s): PHART, HCO3 in the last 72 hours.  Invalid input(s): PCO2, PO2  Studies/Results: Ct Head Wo Contrast  Result Date: 11/09/2016 CLINICAL DATA:  Altered mental status EXAM: CT HEAD WITHOUT CONTRAST TECHNIQUE: Contiguous axial images were obtained from the base of the skull through the vertex without intravenous contrast. COMPARISON:  02/16/2014 FINDINGS: Brain: No acute intracranial abnormality. Specifically, no hemorrhage,  hydrocephalus, mass lesion, acute infarction, or significant intracranial injury. There is atrophy and chronic small vessel disease changes. Vascular: No hyperdense vessel or unexpected calcification. Skull: No acute calvarial abnormality. Sinuses/Orbits: Visualized paranasal sinuses and mastoids clear. Orbital soft tissues unremarkable. Other: None IMPRESSION: No acute intracranial abnormality. Atrophy, chronic microvascular disease. Electronically Signed   By: Rolm Baptise M.D.   On: 11/09/2016 10:03     Assessment/ Plan:   1. Pelvic / bladder mass- pathology still pending. Oncology consulted. Suspect single primary with local progression rather than TCC.  2. 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 Currently on heparin.  3. Hematuria- anticipated in the setting of bilateral ureteral stents, recent TUR procedure, and anticoagulation. Currently, urine remains maroon colored without clots which is reassuring. Hand irrigate as needed.  Serial H/H.  4. Bilateral hydro- s/p stent, resolved with Cr back to baseline.    Urology will continue to follow.       LOS: 2 days    Hollice Espy 11/09/2016

## 2016-11-09 NOTE — Plan of Care (Signed)
Patient continues to struggle with AMS, which has progressively gotten worse over night per 3rd shift report.  Dr. Informed and rounded, sitter placed and low bed placed.  Since 3rd IV removed by patient at about 0530 this AM, new IV will be placed when able.

## 2016-11-09 NOTE — Plan of Care (Signed)
Patient back from CT scan, IV attempted again, but would not thread.  IV Team STAT order placed d/t loosing access at 0500 and needing to be on Heparin drip for current dx.

## 2016-11-09 NOTE — Plan of Care (Signed)
Patient continues to need a sitter in the room at all times for safety.  Patient displays AMS and an inability to keep orientation of current situation. Patient repeatedly wants to go home and feels she is being held against her will.  Family aware of situation, since patient is contacting via phone and asking for help to leave.  Family attempts to re-direct and ask her to calm down and stay for treatment, but patient's mind cannot be changed. At this time - IV intact (guaze wrapped), Heparin running 10/hr continuous, bed alarm on, sitter in place and patient in low bed.

## 2016-11-09 NOTE — Care Management Note (Signed)
Case Management Note  Patient Details  Name: [name] MRN: [id number] Date of Birth: 1941/02/07  Subjective/Objective:   Admitted to [facility] with the diagnosis of deep venous thrombosis. On Eliquis prior to this admission. Lives with husband, Carmin Richmond is [place] [phone]). Prescriptions are filled at [facility]. No home health. Indicated that she had been in a skilled facility in the past but "nothing to talk about." No home oxygen. Rolling walker, cane, no life alert  and raised toilet seats in the home. No falls. Decreased appetite. Lost 5 pounds. Ureteral stents placed Monday. History of bladder cancer. Foley catheter in place. Sitter at the bedside. Confused and has pulled IV out during this admission                Action/Plan: Will continue for discharge needs.    Expected Discharge Date:  11/09/16               Expected Discharge Plan:     In-House Referral:     Discharge planning Services     Post Acute Care Choice:    Choice offered to:     DME Arranged:    DME Agency:     HH Arranged:    HH Agency:     Status of Service:     If discussed at [employer], dates discussed:    Additional Comments:  Shelbie Ammons, RN MSN CCM Care Management [phone] 11/09/2016, 1:54 PM

## 2016-11-10 ENCOUNTER — Ambulatory Visit: Payer: PPO

## 2016-11-10 ENCOUNTER — Inpatient Hospital Stay: Payer: PPO | Admitting: Internal Medicine

## 2016-11-10 ENCOUNTER — Ambulatory Visit: Payer: PPO | Admitting: Urology

## 2016-11-10 LAB — CBC
HCT: 26.8 % — ABNORMAL LOW (ref 35.0–47.0)
Hemoglobin: 9.3 g/dL — ABNORMAL LOW (ref 12.0–16.0)
MCH: 34.9 pg — ABNORMAL HIGH (ref 26.0–34.0)
MCHC: 34.6 g/dL (ref 32.0–36.0)
MCV: 100.8 fL — ABNORMAL HIGH (ref 80.0–100.0)
Platelets: 400 10*3/uL (ref 150–440)
RBC: 2.66 MIL/uL — ABNORMAL LOW (ref 3.80–5.20)
RDW: 18.3 % — AB (ref 11.5–14.5)
WBC: 13 10*3/uL — ABNORMAL HIGH (ref 3.6–11.0)

## 2016-11-10 LAB — HEPARIN LEVEL (UNFRACTIONATED)
HEPARIN UNFRACTIONATED: 1.48 [IU]/mL — AB (ref 0.30–0.70)
Heparin Unfractionated: 1.32 IU/mL — ABNORMAL HIGH (ref 0.30–0.70)

## 2016-11-10 LAB — APTT
APTT: 103 s — AB (ref 24–36)
APTT: 99 s — AB (ref 24–36)
aPTT: 97 seconds — ABNORMAL HIGH (ref 24–36)
aPTT: 78 seconds — ABNORMAL HIGH (ref 24–36)

## 2016-11-10 LAB — HAPTOGLOBIN: HAPTOGLOBIN: 404 mg/dL — AB (ref 34–200)

## 2016-11-10 MED ORDER — ONDANSETRON HCL 4 MG/2ML IJ SOLN
4.0000 mg | Freq: Three times a day (TID) | INTRAMUSCULAR | Status: DC | PRN
  Administered 2016-11-10 – 2016-11-18 (×7): 4 mg via INTRAVENOUS
  Filled 2016-11-10 (×7): qty 2

## 2016-11-10 MED ORDER — ONDANSETRON HCL 4 MG/2ML IJ SOLN: 4.0000 mg | Freq: Three times a day (TID) | INTRAMUSCULAR | Status: DC

## 2016-11-10 NOTE — Progress Notes (Signed)
Okay per Dr. Erlene Quan to discontinue foley catheter that contains 23ml of saline.

## 2016-11-10 NOTE — Progress Notes (Signed)
[place] for Heparin Indication: DVT  Allergies  Allergen Reactions  . Other Swelling    Magic mouthwash  . Penicillins Swelling    Has patient had a PCN reaction causing immediate rash, facial/tongue/throat swelling, SOB or lightheadedness with hypotension: No Has patient had a PCN reaction causing severe rash involving mucus membranes or skin necrosis: No Has patient had a PCN reaction that required hospitalization: No Has patient had a PCN reaction occurring within the last 10 years: No If all of the above answers are "NO", then may proceed with Cephalosporin use.    . Ciprofloxacin Diarrhea    GI distress, abd cramping    Patient Measurements: Height: 4\' 11"  (149.9 cm) Weight: 129 lb (58.5 kg) IBW/kg (Calculated) : 43.2 Heparin Dosing Weight: 55 kg  Vital Signs: Temp: 98.9 F (37.2 C) (08/20 2030) Temp Source: Oral (08/20 2030) BP: 185/82 (08/21 0108) Pulse Rate: 102 (08/21 0108)  Labs:  Recent Labs  11/07/16 1023 11/08/16 0410  11/08/16 1221 11/08/16 1802 11/09/16 0457 11/09/16 1334 11/09/16 2252  HGB 8.4* 7.5*  --   --  9.7* 10.0*  --   --   HCT 23.6* 21.4*  --   --  28.0* 28.1*  --   --   PLT 399 353  --   --   --  375  --   --   APTT  --   --   < > 40*  --  86* 52* 111*  LABPROT  --   --   --  22.7*  --   --   --   --   INR  --   --   --  1.97  --   --   --   --   HEPARINUNFRC  --   --   --  >3.60*  --  3.06* 1.85*  --   CREATININE 1.07* 0.94  --   --   --   --   --   --   < > = values in this interval not displayed.  Estimated Creatinine Clearance: 39 mL/min (by C-G formula based on SCr of 0.94 mg/dL).   Medical History: Past Medical History:  Diagnosis Date  . Anemia   . Anxiety   . Arthritis   . Cancer [facility])    GIST  . Depression   . GERD (gastroesophageal reflux disease)   . GIST (gastrointestinal stroma tumor), malignant, colon ([place])   . History of hiatal hernia   . History of kidney stones   .  Hypertension    NO MEDS NOW  . IBS (irritable bowel syndrome)   . Tremors of nervous system    HEAD    Assessment: 76 y/o F admitted with DVT and started on apixaban with last dose of 10 mg this AM. Hematuria reported per RN. Plan is to transition to heparin drip with possible IVC filter pending.   Goal of Therapy:  APTT 68-109 Heparin level 0.3-0.7 units/ml Monitor platelets by anticoagulation protocol: Yes   Plan:  Will withhold bolus due to apixaban/age/renal function/hematuria. Start heparin infusion at 900 units/hr at 2100 (12 hours after apixaban dose) Check aPTT level in 8 hours and daily while on heparin Continue to monitor H&H and platelets.  8/20 @ 0500 HL/aPTT 3.06/86. HL still elevated but aPTT in range. Will continue current rate and will recheck next HL/aPTT @ 1300, will continue to dose off of aPTT until HL/aPTT correlate.   8/20 2300 aPTT  111. Current rate 1000 units/hr. Decreasing to 900 units/hr. Recheck aPTT and heparin level in 6 hours.  Sim Boast, PharmD, BCPS  11/10/16 1:14 AM

## 2016-11-10 NOTE — Evaluation (Signed)
Physical Therapy Evaluation Patient Details Name: Courtney [name] MRN: [id number] DOB: 09-Jan-1941 Today's Date: 11/10/2016   History of Present Illness  76 y.o. female with a known history of GIST abdominal tumor and recent diagnosis of bladder tumor and patient requiring ureteral stents. Patient presented with left leg pain, found to have DVT.    Clinical Impression  Pt did very well with ambulation and mobility, she was able to easily negotiate up down 3 steps and overall states she is near her baseline.  She had some confusion but was pleasant the entire session.  Pt would benefit from some supervision secondary to confusion but she was not unsafe or showing poor safety awareness.     Follow Up Recommendations No PT follow up    Equipment Recommendations       Recommendations for Other Services       Precautions / Restrictions Precautions Precautions: Fall Restrictions Weight Bearing Restrictions: No      Mobility  Bed Mobility Overal bed mobility: Independent                Transfers Overall transfer level: Independent               General transfer comment: Pt able to get up to EOB w/o assist  Ambulation/Gait Ambulation/Gait assistance: Independent Ambulation Distance (Feet): 300 Feet Assistive device: None       General Gait Details: Pt walks with great speed and confidence.  No fatigue with prolonged ambulation and generally no issues  Stairs            Wheelchair Mobility    Modified Rankin (Stroke Patients Only)       Balance Overall balance assessment: Independent                                           Pertinent Vitals/Pain Pain Assessment:  (unrated abdominal pain, not functionally limiting)    Home Living Family/patient expects to be discharged to:: Private residence Living Arrangements: Spouse/significant other;Other relatives Available Help at Discharge: Family   Home Access: Stairs to enter    Entrance Stairs-Number of Steps: 2          Prior Function Level of Independence: Independent               Hand Dominance        Extremity/Trunk Assessment   Upper Extremity Assessment Upper Extremity Assessment: Overall WFL for tasks assessed    Lower Extremity Assessment Lower Extremity Assessment: Overall WFL for tasks assessed       Communication   Communication: No difficulties  Cognition Arousal/Alertness: Awake/alert Behavior During Therapy: Impulsive Overall Cognitive Status:  (unsure of baseline, pt pleasantly confused t/o the session)                                        General Comments      Exercises     Assessment/Plan    PT Assessment Patent does not need any further PT services  PT Problem List Decreased strength;Decreased activity tolerance;Decreased balance;Decreased mobility;Decreased cognition;Decreased coordination;Decreased knowledge of use of DME;Decreased safety awareness       PT Treatment Interventions      PT Goals (Current goals can be found in the Care Plan section)  Acute Rehab PT Goals Patient  Stated Goal: go home PT Goal Formulation: All assessment and education complete, DC therapy    Frequency     Barriers to discharge        Co-evaluation               AM-PAC PT "6 Clicks" Daily Activity  Outcome Measure Difficulty turning over in bed (including adjusting bedclothes, sheets and blankets)?: None Difficulty moving from lying on back to sitting on the side of the bed? : None Difficulty sitting down on and standing up from a chair with arms (e.g., wheelchair, bedside commode, etc,.)?: None Help needed moving to and from a bed to chair (including a wheelchair)?: None Help needed walking in hospital room?: None Help needed climbing 3-5 steps with a railing? : None 6 Click Score: 24    End of Session Equipment Utilized During Treatment: Gait belt Activity Tolerance: Patient tolerated  treatment well Patient left: with nursing/sitter in room;in chair;with call bell/phone within reach   PT Visit Diagnosis: Muscle weakness (generalized) (M62.81)    Time: 2694-8546 PT Time Calculation (min) (ACUTE ONLY): 15 min   Charges:   PT Evaluation $PT Eval Low Complexity: 1 Low     PT G Codes:   PT G-Codes **NOT FOR INPATIENT CLASS** Functional Assessment Tool Used: AM-PAC 6 Clicks Basic Mobility Functional Limitation: Mobility: Walking and moving around Mobility: Walking and Moving Around Current Status (E7035): 0 percent impaired, limited or restricted Mobility: Walking and Moving Around Goal Status (K0938): 0 percent impaired, limited or restricted Mobility: Walking and Moving Around Discharge Status (H8299): 0 percent impaired, limited or restricted    Kreg Shropshire, DPT 11/10/2016, 11:14 AM

## 2016-11-10 NOTE — Progress Notes (Addendum)
[place] for Heparin Indication: DVT  Allergies  Allergen Reactions  . Other Swelling    Magic mouthwash  . Penicillins Swelling    Has patient had a PCN reaction causing immediate rash, facial/tongue/throat swelling, SOB or lightheadedness with hypotension: No Has patient had a PCN reaction causing severe rash involving mucus membranes or skin necrosis: No Has patient had a PCN reaction that required hospitalization: No Has patient had a PCN reaction occurring within the last 10 years: No If all of the above answers are "NO", then may proceed with Cephalosporin use.    . Ciprofloxacin Diarrhea    GI distress, abd cramping    Patient Measurements: Height: 4\' 11"  (149.9 cm) Weight: 129 lb (58.5 kg) IBW/kg (Calculated) : 43.2 Heparin Dosing Weight: 55 kg  Vital Signs: Temp: 98.3 F (36.8 C) (08/21 1223) Temp Source: Oral (08/21 1223) BP: 140/73 (08/21 1229) Pulse Rate: 109 (08/21 1229)  Labs:  Recent Labs  11/08/16 0410  11/08/16 1221 11/08/16 1802 11/09/16 0457 11/09/16 1334  11/10/16 0611 11/10/16 0809 11/10/16 1420  HGB 7.5*  --   --  9.7* 10.0*  --   --  9.3*  --   --   HCT 21.4*  --   --  28.0* 28.1*  --   --  26.8*  --   --   PLT 353  --   --   --  375  --   --  400  --   --   APTT  --   < > 40*  --  86* 52*  < > 103* 97* 78*  LABPROT  --   --  22.7*  --   --   --   --   --   --   --   INR  --   --  1.97  --   --   --   --   --   --   --   HEPARINUNFRC  --   < > >3.60*  --  3.06* 1.85*  --  1.48* 1.32*  --   CREATININE 0.94  --   --   --   --   --   --   --   --   --   < > = values in this interval not displayed.  Estimated Creatinine Clearance: 39 mL/min (by C-G formula based on SCr of 0.94 mg/dL).   Medical History: Past Medical History:  Diagnosis Date  . Anemia   . Anxiety   . Arthritis   . Cancer [facility])    GIST  . Depression   . GERD (gastroesophageal reflux disease)   . GIST (gastrointestinal stroma  tumor), malignant, colon ([place])   . History of hiatal hernia   . History of kidney stones   . Hypertension    NO MEDS NOW  . IBS (irritable bowel syndrome)   . Tremors of nervous system    HEAD    Assessment: 76 y/o F admitted with DVT and started on apixaban with last dose of 10 mg 8/19. Hematuria reported per RN. Plan is to transition to heparin drip with possible IVC filter pending. 8-hour APTT level back at 78, which is within range.   Goal of Therapy:  APTT 68-109 Heparin level 0.3-0.7 units/ml Monitor platelets by anticoagulation protocol: Yes   Plan:  Will continue heparin infusion at 900 units/hr  Check aPTT level in 8 hours and daily while on heparin Continue to monitor  H&H and platelets.  [place] Resident  11/10/16 3:07 PM     8/21 2300 aPTT 99. Continue current regimen. Recheck aPTT, heparin level and CBC with tomorrow AM labs.  Sim Boast, PharmD, BCPS  11/11/16 12:27 AM   8/22 AM aPTT 93, heparin level 0.93. Continue current regimen. Recheck aPTT, heparin level, and CBC with tomorrow AM labs.  Sim Boast, PharmD, BCPS  11/11/16 5:49 AM

## 2016-11-10 NOTE — Progress Notes (Signed)
Urology Consult Follow Up  Subjective: Patient still having confusion.  Some lucid conversation this am - asking about pathology results - complaining of pain in the left suprapubic area.  Urine remains merlot color - no clots and no need for irrigation over night per nursing staff.  UOP good.  Hbg stable at 9.3.  Sitter at bedside.   Anti-infectives: Anti-infectives    None      Current Facility-Administered Medications  Medication Dose Route Frequency Provider Last Rate Last Dose  . acetaminophen (TYLENOL) tablet 650 mg  650 mg Oral Q6H PRN Loletha Grayer, MD       Or  . acetaminophen (TYLENOL) suppository 650 mg  650 mg Rectal Q6H PRN Loletha Grayer, MD      . cholecalciferol (VITAMIN D) tablet 1,000 Units  1,000 Units Oral Daily Loletha Grayer, MD   1,000 Units at 11/09/16 [phone]  . cyanocobalamin tablet 1,000 mcg  1,000 mcg Oral Daily Loletha Grayer, MD   1,000 mcg at 11/09/16 8250  . ferrous sulfate tablet 325 mg  325 mg Oral TID WC Loletha Grayer, MD   325 mg at 11/09/16 1742  . heparin ADULT infusion 100 units/mL (25000 units/266mL sodium chloride 0.45%)  900 Units/hr Intravenous Continuous Bettey Costa, MD 9 mL/hr at 11/10/16 0147 900 Units/hr at 11/10/16 0147  . hydrALAZINE (APRESOLINE) injection 10 mg  10 mg Intravenous Q4H PRN Saundra Shelling, MD   10 mg at 11/10/16 0112  . morphine 2 MG/ML injection 1 mg  1 mg Intravenous Q4H PRN Hugelmeyer, Alexis, DO   1 mg at 11/08/16 2007  . morphine 2 MG/ML injection 2 mg  2 mg Intravenous Once Nena Polio, MD      . multivitamin with minerals tablet 1 tablet  1 tablet Oral Daily Loletha Grayer, MD   1 tablet at 11/09/16 [phone]  . multivitamin-lutein (OCUVITE-LUTEIN) capsule 2 capsule  2 capsule Oral Daily Loletha Grayer, MD   2 capsule at 11/09/16 6734  . ondansetron (ZOFRAN) injection 4 mg  4 mg Intravenous Q8H PRN Hugelmeyer, Alexis, DO   4 mg at 11/10/16 0113  . oxyCODONE (Oxy IR/ROXICODONE) immediate release tablet 5 mg  5  mg Oral Q6H PRN Loletha Grayer, MD   5 mg at 11/09/16 0555  . potassium chloride SA (K-DUR,KLOR-CON) CR tablet 20 mEq  20 mEq Oral Daily Loletha Grayer, MD   20 mEq at 11/09/16 1937  . risperiDONE (RISPERDAL) tablet 0.25 mg  0.25 mg Oral BID PRN Bettey Costa, MD   0.25 mg at 11/09/16 0936     Objective: Vital signs in last 24 hours: Temp:  [98.8 F (37.1 C)-98.9 F (37.2 C)] 98.9 F (37.2 C) (08/20 2030) Pulse Rate:  [102-110] 105 (08/21 0140) Resp:  [16-18] 18 (08/21 0037) BP: (140-185)/(63-82) 146/65 (08/21 0140) SpO2:  [97 %-99 %] 97 % (08/21 0037)  Intake/Output from previous day: 08/20 0701 - 08/21 0700 In: 429.6 [P.O.:265; I.V.:164.6] Out: 950 [Urine:950] Intake/Output this shift: No intake/output data recorded.   Physical Exam Constitutional: Well nourished.  Oriented to self aware of biopsy taking place, No acute distress. HEENT: [place] AT, moist mucus membranes. Trachea midline, no masses. Cardiovascular: No clubbing, cyanosis, or edema. Respiratory: Normal respiratory effort, no increased work of breathing. GI: Abdomen is soft, non tender, non distended, no abdominal masses. Liver and spleen not palpable.  No hernias appreciated.  Stool sample for occult testing is not indicated.   GU: No CVA tenderness.  No bladder fullness or masses.  Foley in place, draining merlot colored urine - no clots Skin: No rashes, bruises or suspicious lesions. Lymph: No cervical or inguinal adenopathy. Neurologic: Grossly intact, no focal deficits, moving all 4 extremities. Psychiatric: Normal mood and affect.  Lab Results:   Recent Labs  11/09/16 0457 11/10/16 0611  WBC 10.1 13.0*  HGB 10.0* 9.3*  HCT 28.1* 26.8*  PLT 375 400   BMET  Recent Labs  11/07/16 1023 11/08/16 0410  NA 135 138  K 3.1* 4.0  CL 101 106  CO2 24 25  GLUCOSE 100* 96  BUN 15 13  CREATININE 1.07* 0.94  CALCIUM 8.4* 7.7*   PT/INR  Recent Labs  11/08/16 1221  LABPROT 22.7*  INR 1.97    ABG No results for input(s): PHART, HCO3 in the last 72 hours.  Invalid input(s): PCO2, PO2  Studies/Results: Ct Head Wo Contrast  Result Date: 11/09/2016 CLINICAL DATA:  Altered mental status EXAM: CT HEAD WITHOUT CONTRAST TECHNIQUE: Contiguous axial images were obtained from the base of the skull through the vertex without intravenous contrast. COMPARISON:  02/16/2014 FINDINGS: Brain: No acute intracranial abnormality. Specifically, no hemorrhage, hydrocephalus, mass lesion, acute infarction, or significant intracranial injury. There is atrophy and chronic small vessel disease changes. Vascular: No hyperdense vessel or unexpected calcification. Skull: No acute calvarial abnormality. Sinuses/Orbits: Visualized paranasal sinuses and mastoids clear. Orbital soft tissues unremarkable. Other: None IMPRESSION: No acute intracranial abnormality. Atrophy, chronic microvascular disease. Electronically Signed   By: Rolm Baptise M.D.   On: 11/09/2016 10:03     Assessment and Plan 1. Pelvic / bladder mass- pathology still pending. Seen by oncology yesterday.  Suspect single primary with local progression rather than TCC.  2. 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 Currently on heparin.  3. Hematuria- anticipated in the setting of bilateral ureteral stents, recent TUR procedure, and anticoagulation. Currently, urine remains maroon colored without clots which is reassuring. Hand irrigate as needed.  Serial H/H. -hbg remains stable  4. Bilateral hydro- s/p stent, having stent discomfort -  resolved with Cr back to baseline.    Urology will continue to follow.       LOS: 3 days    [facility] 11/10/2016

## 2016-11-10 NOTE — Progress Notes (Signed)
[place] [facility] [place] NAME: Nolah Krenzer    MR#:  [id number]  DATE OF BIRTH:  1940-12-29  SUBJECTIVE:  Patient back to baseline this am No confusion noted  REVIEW OF SYSTEMS:    Review of Systems   Review of Systems  Constitutional: Negative for fever, chills weight loss HENT: Negative for ear pain, nosebleeds, congestion, facial swelling, rhinorrhea, neck pain, neck stiffness and ear discharge.   Respiratory: Negative for cough, shortness of breath, wheezing  Cardiovascular: Negative for chest pain, palpitations and leg swelling.  Gastrointestinal: Negative for heartburn, abdominal pain, vomiting, diarrhea or consitpation Genitourinary: Negative for dysuria, urgency, frequency, ++ hematuria Musculoskeletal: Negative for back pain or joint pain Neurological: Negative for dizziness, seizures, syncope, focal weakness,  numbness and headaches.  Hematological: Does not bruise/bleed easily.  Psychiatric/Behavioral: Negative for hallucinations, confusion, dysphoric mood    DRUG ALLERGIES:   Allergies  Allergen Reactions  . Other Swelling    Magic mouthwash  . Penicillins Swelling    Has patient had a PCN reaction causing immediate rash, facial/tongue/throat swelling, SOB or lightheadedness with hypotension: No Has patient had a PCN reaction causing severe rash involving mucus membranes or skin necrosis: No Has patient had a PCN reaction that required hospitalization: No Has patient had a PCN reaction occurring within the last 10 years: No If all of the above answers are "NO", then may proceed with Cephalosporin use.    . Ciprofloxacin Diarrhea    GI distress, abd cramping    VITALS:  Blood pressure (!) 147/66, pulse (!) 108, temperature 98.1 F (36.7 C), temperature source Oral, resp. rate 18, height 4\' 11"  (1.499 m), weight 58.5 kg (129 lb), SpO2 98 %.  PHYSICAL EXAMINATION:  Constitutional: Appears well-developed and well-nourished.  No distress. HENT: Normocephalic. Marland Kitchen Oropharynx is clear and moist.  Eyes: Conjunctivae and EOM are normal. PERRLA, no scleral icterus.  Neck: Normal ROM. Neck supple. No JVD. No tracheal deviation. CVS: RRR, S1/S2 +, no murmurs, no gallops, no carotid bruit.  Pulmonary: Effort and breath sounds normal, no stridor, rhonchi, wheezes, rales.  Abdominal: Soft. BS +,  no distension, tenderness, rebound or guarding.  Musculoskeletal: Normal range of motion. No edema and no tenderness.  Neuro: Alert Oriented to name, place and time CN 2-12 grossly intact. No focal deficits. Skin: Skin is warm and dry. No rash noted. Psychiatric: Normal mood and affect.      LABORATORY PANEL:   CBC  Recent Labs Lab 11/10/16 0611  WBC 13.0*  HGB 9.3*  HCT 26.8*  PLT 400   ------------------------------------------------------------------------------------------------------------------  Chemistries   Recent Labs Lab 11/07/16 1023 11/08/16 0410  NA 135 138  K 3.1* 4.0  CL 101 106  CO2 24 25  GLUCOSE 100* 96  BUN 15 13  CREATININE 1.07* 0.94  CALCIUM 8.4* 7.7*  MG 1.3*  --   AST 27  --   ALT 27  --   ALKPHOS 49  --   BILITOT 0.8  --    ------------------------------------------------------------------------------------------------------------------  Cardiac Enzymes No results for input(s): TROPONINI in the last 168 hours. ------------------------------------------------------------------------------------------------------------------  RADIOLOGY:  Ct Head Wo Contrast  Result Date: 11/09/2016 CLINICAL DATA:  Altered mental status EXAM: CT HEAD WITHOUT CONTRAST TECHNIQUE: Contiguous axial images were obtained from the base of the skull through the vertex without intravenous contrast. COMPARISON:  02/16/2014 FINDINGS: Brain: No acute intracranial abnormality. Specifically, no hemorrhage, hydrocephalus, mass lesion, acute infarction, or significant intracranial injury. There is atrophy  and  chronic small vessel disease changes. Vascular: No hyperdense vessel or unexpected calcification. Skull: No acute calvarial abnormality. Sinuses/Orbits: Visualized paranasal sinuses and mastoids clear. Orbital soft tissues unremarkable. Other: None IMPRESSION: No acute intracranial abnormality. Atrophy, chronic microvascular disease. Electronically Signed   By: Rolm Baptise M.D.   On: 11/09/2016 10:03     ASSESSMENT AND PLAN:   [name] with history of progressive metastatic GIST dx 02/2014 and recent diagnosis of bladder tumor s/p TURBT and bilateral placement who presented with left leg pain and found to have DVT.   1. Left lower extremity DVT, nonocclusive in the common femoral vein and occlusive in the posterior tibial vein:due to underlying malignancy Due to concern of bleeding she is on heparin gtt. Will d/w Oncology long term plan for [facility]   2. Acute on chronic blood loss anemia with recent diagnosis of bladder tumor status post TURBT Status post 1 unit PRBC 8/19 with Hemoglobin stable today Continue ferrous sulfate  3.history of GISTT and bladder tumor:  Appreciate urology and oncology evaluation Biopsy results pending  4.hypokalemia: Resolved  5. Delirium:Resolved D/c sitter  Management plans discussed with the nursing and patient who is in agreementCODE STATUS: full  TOTAL TIME TAKING CARE OF THIS PATIENT: 24 minutes.     POSSIBLE D/C 1- 2 days, DEPENDING ON CLINICAL CONDITION.   Marik Sedore M.D on 11/10/2016 at 11:18 AM  Between 7am to 6pm - Pager - [phone] After 6pm go to [email or web address] - password [email or web address]  Office  [phone]  CC: Primary care physician; Rusty Aus, MD  Note: This dictation was prepared with Dragon dictation along with smaller phrase technology. Any transcriptional errors that result from this process are unintentional.

## 2016-11-10 NOTE — Care Management Important Message (Signed)
Important Message  Patient Details  Name: Courtney SALAZ MRN: [id number] Date of Birth: 06-12-1940   Medicare Important Message Given:  Yes    Shelbie Ammons, RN 11/10/2016, 8:09 AM

## 2016-11-10 NOTE — Progress Notes (Signed)
Pt complaining  of nausea. Primary nurse notified MD of pt complained. Orders received for zofran 4 mg IV q 8 hrs PRN. Primary nurse to continue to monitor.

## 2016-11-11 DIAGNOSIS — D62 Acute posthemorrhagic anemia: Secondary | ICD-10-CM

## 2016-11-11 DIAGNOSIS — R310 Gross hematuria: Secondary | ICD-10-CM

## 2016-11-11 LAB — BASIC METABOLIC PANEL
Anion gap: 8 (ref 5–15)
BUN: 24 mg/dL — AB (ref 6–20)
CHLORIDE: 106 mmol/L (ref 101–111)
CO2: 24 mmol/L (ref 22–32)
CREATININE: 0.94 mg/dL (ref 0.44–1.00)
Calcium: 8.5 mg/dL — ABNORMAL LOW (ref 8.9–10.3)
GFR calc Af Amer: >60 mL/min (ref >60)
GFR calc non Af Amer: 57 mL/min — ABNORMAL LOW (ref >60)
GLUCOSE: 118 mg/dL — AB (ref 65–99)
POTASSIUM: 3.9 mmol/L (ref 3.5–5.1)
SODIUM: 138 mmol/L (ref 135–145)

## 2016-11-11 LAB — CBC
HEMATOCRIT: 24.4 % — AB (ref 35.0–47.0)
HEMOGLOBIN: 8.2 g/dL — AB (ref 12.0–16.0)
MCH: 34.2 pg — AB (ref 26.0–34.0)
MCHC: 33.5 g/dL (ref 32.0–36.0)
MCV: 102 fL — AB (ref 80.0–100.0)
Platelets: 373 10*3/uL (ref 150–440)
RBC: 2.39 MIL/uL — AB (ref 3.80–5.20)
RDW: 17.3 % — ABNORMAL HIGH (ref 11.5–14.5)
WBC: 12.9 10*3/uL — ABNORMAL HIGH (ref 3.6–11.0)

## 2016-11-11 LAB — HEPARIN LEVEL (UNFRACTIONATED): HEPARIN UNFRACTIONATED: 0.93 [IU]/mL — AB (ref 0.30–0.70)

## 2016-11-11 LAB — APTT: aPTT: 93 seconds — ABNORMAL HIGH (ref 24–36)

## 2016-11-11 MED ORDER — APIXABAN 5 MG PO TABS
10.0000 mg | ORAL_TABLET | Freq: Two times a day (BID) | ORAL | Status: DC
  Administered 2016-11-11 (×2): 10 mg via ORAL
  Filled 2016-11-11 (×2): qty 2

## 2016-11-11 MED ORDER — BISACODYL 10 MG RE SUPP
10.0000 mg | Freq: Every day | RECTAL | Status: DC
  Administered 2016-11-11 – 2016-11-17 (×4): 10 mg via RECTAL
  Filled 2016-11-11 (×5): qty 1

## 2016-11-11 MED ORDER — DOCUSATE SODIUM 100 MG PO CAPS
100.0000 mg | ORAL_CAPSULE | Freq: Two times a day (BID) | ORAL | Status: DC
  Administered 2016-11-11 – 2016-11-18 (×13): 100 mg via ORAL
  Filled 2016-11-11 (×13): qty 1

## 2016-11-11 MED ORDER — POLYETHYLENE GLYCOL 3350 17 G PO PACK
17.0000 g | PACK | Freq: Every day | ORAL | Status: DC
  Administered 2016-11-11 – 2016-11-17 (×4): 17 g via ORAL
  Filled 2016-11-11 (×5): qty 1

## 2016-11-11 MED ORDER — PHENAZOPYRIDINE HCL 100 MG PO TABS
100.0000 mg | ORAL_TABLET | Freq: Three times a day (TID) | ORAL | Status: DC | PRN
  Administered 2016-11-11: 100 mg via ORAL
  Filled 2016-11-11 (×2): qty 1

## 2016-11-11 NOTE — Progress Notes (Addendum)
ANTICOAGULATION CONSULT NOTE- Initial   Pharmacy Consult for Apixaban  Indication: DVT  Allergies  Allergen Reactions  . Other Swelling    Magic mouthwash  . Penicillins Swelling    Has patient had a PCN reaction causing immediate rash, facial/tongue/throat swelling, SOB or lightheadedness with hypotension: No Has patient had a PCN reaction causing severe rash involving mucus membranes or skin necrosis: No Has patient had a PCN reaction that required hospitalization: No Has patient had a PCN reaction occurring within the last 10 years: No If all of the above answers are "NO", then may proceed with Cephalosporin use.    . Ciprofloxacin Diarrhea    GI distress, abd cramping    Patient Measurements: Height: 4\' 11"  (149.9 cm) Weight: 129 lb (58.5 kg) IBW/kg (Calculated) : 43.2 Heparin Dosing Weight: 55 kg  Vital Signs: Temp: 97.9 F (36.6 C) (08/22 0512) Temp Source: Oral (08/22 0512) BP: 142/68 (08/22 0512) Pulse Rate: 96 (08/22 0512)  Labs:  Recent Labs  11/08/16 1221  11/09/16 0457  11/10/16 0611 11/10/16 0809 11/10/16 1420 11/10/16 2250 11/11/16 0418  HGB  --   < > 10.0*  --  9.3*  --   --   --  8.2*  HCT  --   < > 28.1*  --  26.8*  --   --   --  24.4*  PLT  --   --  375  --  400  --   --   --  373  APTT 40*  --  86*  < > 103* 97* 78* 99* 93*  LABPROT 22.7*  --   --   --   --   --   --   --   --   INR 1.97  --   --   --   --   --   --   --   --   HEPARINUNFRC >3.60*  --  3.06*  < > 1.48* 1.32*  --   --  0.93*  CREATININE  --   --   --   --   --   --   --   --  0.94  < > = values in this interval not displayed.  Estimated Creatinine Clearance: 39 mL/min (by C-G formula based on SCr of 0.94 mg/dL).   Medical History: Past Medical History:  Diagnosis Date  . Anemia   . Anxiety   . Arthritis   . Cancer [facility])    GIST  . Depression   . GERD (gastroesophageal reflux disease)   . GIST (gastrointestinal stroma tumor), malignant, colon ([place])   . History of  hiatal hernia   . History of kidney stones   . Hypertension    NO MEDS NOW  . IBS (irritable bowel syndrome)   . Tremors of nervous system    HEAD    Assessment: 76 y/o F admitted with DVT and started on apixaban with last dose of 10 mg 8/19. Hematuria reported per RN. Patient was transitioned from apixaban to heparin drip with possible IVC filter pending. Per Dr. Benjie Karvonen, patient will now be transitioned from heparin back to apixaban today.    Goal of Therapy:  APTT 68-109 Heparin level 0.3-0.7 units/ml Monitor platelets by anticoagulation protocol: Yes   Plan:  Patient will receive apixaban 10 mg twice daily followed by apixaban 5 mg daily at time of discharge, if hemoglobin remains stable.  Continue to monitor H&H and platelets.  [place] Resident  11/11/16 8:13  AM  

## 2016-11-11 NOTE — Progress Notes (Signed)
Urology Consult Follow Up  Subjective: Mental status back to baseline. 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  Foley removed yesterday, voiding bloody urine without clots. Blood at urethral meatus per nurse.  Anti-infectives: Anti-infectives    None      Current Facility-Administered Medications  Medication Dose Route Frequency Provider Last Rate Last Dose  . acetaminophen (TYLENOL) tablet 650 mg  650 mg Oral Q6H PRN Loletha Grayer, MD   650 mg at 11/10/16 1042   Or  . acetaminophen (TYLENOL) suppository 650 mg  650 mg Rectal Q6H PRN Loletha Grayer, MD      . apixaban (ELIQUIS) tablet 10 mg  10 mg Oral BID Candelaria Stagers, RPH   10 mg at 11/11/16 1001  . bisacodyl (DULCOLAX) suppository 10 mg  10 mg Rectal Daily Bettey Costa, MD   10 mg at 11/11/16 1238  . cholecalciferol (VITAMIN D) tablet 1,000 Units  1,000 Units Oral Daily Loletha Grayer, MD   1,000 Units at 11/11/16 1001  . cyanocobalamin tablet 1,000 mcg  1,000 mcg Oral Daily Loletha Grayer, MD   1,000 mcg at 11/11/16 1001  . docusate sodium (COLACE) capsule 100 mg  100 mg Oral BID Bettey Costa, MD   100 mg at 11/11/16 1237  . ferrous sulfate tablet 325 mg  325 mg Oral TID WC Loletha Grayer, MD   325 mg at 11/11/16 1237  . hydrALAZINE (APRESOLINE) injection 10 mg  10 mg Intravenous Q4H PRN Saundra Shelling, MD   10 mg at 11/10/16 0112  . morphine 2 MG/ML injection 1 mg  1 mg Intravenous Q4H PRN Hugelmeyer, Alexis, DO   1 mg at 11/08/16 2007  . morphine 2 MG/ML injection 2 mg  2 mg Intravenous Once Nena Polio, MD      . multivitamin with minerals tablet 1 tablet  1 tablet Oral Daily Loletha Grayer, MD   1 tablet at 11/11/16 1001  . multivitamin-lutein (OCUVITE-LUTEIN) capsule 2 capsule  2 capsule Oral Daily Loletha Grayer, MD   2 capsule at 11/11/16 1001  . ondansetron (ZOFRAN) injection 4 mg  4 mg Intravenous Q8H PRN Hugelmeyer, Alexis,  DO   4 mg at 11/10/16 0113  . oxyCODONE (Oxy IR/ROXICODONE) immediate release tablet 5 mg  5 mg Oral Q6H PRN Loletha Grayer, MD   5 mg at 11/11/16 0438  . polyethylene glycol (MIRALAX / GLYCOLAX) packet 17 g  17 g Oral Daily Bettey Costa, MD   17 g at 11/11/16 1238  . potassium chloride SA (K-DUR,KLOR-CON) CR tablet 20 mEq  20 mEq Oral Daily Loletha Grayer, MD   20 mEq at 11/11/16 1002  . risperiDONE (RISPERDAL) tablet 0.25 mg  0.25 mg Oral BID PRN Bettey Costa, MD   0.25 mg at 11/09/16 0936     Objective: Vital signs in last 24 hours: Temp:  [97.9 F (36.6 C)-98.6 F (37 C)] 98.6 F (37 C) (08/22 1226) Pulse Rate:  [96-108] 108 (08/22 1226) Resp:  [18-22] 20 (08/22 1226) BP: (137-147)/(60-71) 147/71 (08/22 1226) SpO2:  [98 %-99 %] 98 % (08/22 1226)  Intake/Output from previous day: 08/21 0701 - 08/22 0700 In: 512 [P.O.:240; I.V.:272] Out: 350 [Urine:350] Intake/Output this shift: Total I/O In: 265 [P.O.:240; I.V.:25] Out: 50 [Urine:50]   Physical Exam Constitutional: Well nourished.  Oriented to self aware of biopsy taking place, No acute distress. HEENT: [place] AT, moist mucus membranes. Trachea midline, no masses. Cardiovascular: No clubbing, cyanosis, or edema.  Respiratory: Normal respiratory effort, no increased work of breathing. GI: Abdomen is soft, non tender, non distended, no abdominal masses. Liver and spleen not palpable.  No hernias appreciated.  Stool sample for occult testing is not indicated.   GU: No CVA tenderness.  No bladder fullness or masses.   Skin: No rashes, bruises or suspicious lesions. Lymph: No cervical or inguinal adenopathy. Neurologic: Grossly intact, no focal deficits, moving all 4 extremities. Psychiatric: Normal mood and affect.  Lab Results:   Recent Labs  11/10/16 0611 11/11/16 0418  WBC 13.0* 12.9*  HGB 9.3* 8.2*  HCT 26.8* 24.4*  PLT 400 373   BMET  Recent Labs  11/11/16 0418  NA 138  K 3.9  CL 106  CO2 24  GLUCOSE  118*  BUN 24*  CREATININE 0.94  CALCIUM 8.5*    Assessment and Plan 1. 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  Discussed with pathologist and Dr. Tish Men aware.  2. 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 Transitioned from heparin to [facility] was.  3. Hematuria- anticipated in the setting of bilateral ureteral stents, recent TUR procedure, and anticoagulation. Hemoglobin continues to trend downward slightly today with persistent hematuria. Recheck in a.m. If relatively stable, okay for discharge.  4. Bilateral hydro- s/p stent, having stent discomfort -  resolved with Cr back to baseline.   Urology will continue to follow.       LOS: 4 days    Hollice Espy 11/11/2016

## 2016-11-11 NOTE — Progress Notes (Signed)
Courtney [name]   DOB:12/15/1940   TF#:[id number]    Subjective: Patient alert today. She complains of constipation; however had a bowel movement. Mild blood in urine. Foley catheter was taken out. She complains of pain with urination.   Review of system: Denies any nausea vomiting. Denies any chest pain or shortness of breath.  Objective:  Vitals:   11/11/16 1226 11/11/16 2034  BP: (!) 147/71 (!) 161/81  Pulse: (!) 108 (!) 119  Resp: 20 18  Temp: 98.6 F (37 C) 98.3 F (36.8 C)  SpO2: 98% 96%     Intake/Output Summary (Last 24 hours) at 11/11/16 2225 Last data filed at 11/11/16 1640  Gross per 24 hour  Intake              370 ml  Output              400 ml  Net              -30 ml    GENERAL: Well-nourished well-developed; Thin built.. Alert, no distress and comfortable.   Alone.  EYES: no pallor or icterus OROPHARYNX: no thrush or ulceration. NECK: supple, no masses felt LYMPH:  no palpable lymphadenopathy in the cervical, axillary or inguinal regions LUNGS: decreased breath sounds to auscultation at bases and  No wheeze or crackles HEART/CVS: regular rate & rhythm and no murmurs; swelling/ pain of Left LE.  ABDOMEN: abdomen soft, non-tender and normal bowel sounds;Marland Kitchen  Musculoskeletal:no cyanosis of digits and no clubbing  PSYCH: alert & oriented x 0.  NEURO: no focal motor/sensory deficits SKIN:  no rashes or significant lesions   Labs:  Lab Results  Component Value Date   WBC 12.9 (H) 11/11/2016   HGB 8.2 (L) 11/11/2016   HCT 24.4 (L) 11/11/2016   MCV 102.0 (H) 11/11/2016   PLT 373 11/11/2016   NEUTROABS 6.3 11/07/2016    Lab Results  Component Value Date   NA 138 11/11/2016   K 3.9 11/11/2016   CL 106 11/11/2016   CO2 24 11/11/2016    Studies:  No results found.  Assessment & Plan:   # 76 year old female patient with history of Gist tumor abdomen/pelvis-currently in the hospital for left lower extremity DVT  # Left lower extremity DVT-  Likely secondary  to malignancy/ mechanical obstruction at level of IVC. Currently on Eliquis.    # Hematuria- status post recent stenting for bilateral hydronephrosis; TURBT. Mild hematuria improving. Monitor close in the context of anticoagulation. Awaiting for CBC in the morning.  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 Preliminary pathology on TURBT/cystoscopy positive for high-grade malignancy.  I clinically suspect this is recurrence of her previous Gist tumor.  Discussed with Dr.Onley- final pathology may take up to one more week.   # Anemia- multifactorial- underlying disease/hematuria. Status post blood transfusion hemoglobin around 10.  # Delirium/acute confusion- currently resolved.   # Long discussion the patient's son, Abe People over the phone- updated of the recent developments.  Patient will likely be discharged tomorrow if hemoglobin is stable. Patient follow-up with me in [place] on 82/28 with labs. Discussed with Dr.Mody.   Cammie Sickle, MD 11/11/2016  10:25 PM

## 2016-11-11 NOTE — Progress Notes (Signed)
Notified Dr. Erlene Quan that patient is experiencing blood running out of hee urethra even when she is not urination. Per MD this is to be expected with the procedure she had.

## 2016-11-11 NOTE — Care Management (Signed)
Possible discharge tomorrow per Dr. Benjie Karvonen. Will be discharged on Eliquis. Will give $10.00 co-pay card Shelbie Ammons RN MSN CCM Care Management [phone]

## 2016-11-11 NOTE — Progress Notes (Signed)
[place] [facility] [place] NAME: Courtney [name]    MR#:  [id number]  DATE OF BIRTH:  1940/09/19  SUBJECTIVE:   Patient wants to go home She is doing well Foley removed  REVIEW OF SYSTEMS:    Review of Systems   Review of Systems  Constitutional: Negative for fever, chills weight loss HENT: Negative for ear pain, nosebleeds, congestion, facial swelling, rhinorrhea, neck pain, neck stiffness and ear discharge.   Respiratory: Negative for cough, shortness of breath, wheezing  Cardiovascular: Negative for chest pain, palpitations and leg swelling.  Gastrointestinal: Negative for heartburn, abdominal pain, vomiting, diarrhea or consitpation Genitourinary: Negative for dysuria, urgency, frequency, ++ hematuria resolving wearing depends today Musculoskeletal: Negative for back pain or joint pain Neurological: Negative for dizziness, seizures, syncope, focal weakness,  numbness and headaches.  Hematological: Does not bruise/bleed easily.  Psychiatric/Behavioral: Negative for hallucinations, confusion, dysphoric mood    DRUG ALLERGIES:   Allergies  Allergen Reactions  . Other Swelling    Magic mouthwash  . Penicillins Swelling    Has patient had a PCN reaction causing immediate rash, facial/tongue/throat swelling, SOB or lightheadedness with hypotension: No Has patient had a PCN reaction causing severe rash involving mucus membranes or skin necrosis: No Has patient had a PCN reaction that required hospitalization: No Has patient had a PCN reaction occurring within the last 10 years: No If all of the above answers are "NO", then may proceed with Cephalosporin use.    . Ciprofloxacin Diarrhea    GI distress, abd cramping    VITALS:  Blood pressure (!) 142/68, pulse 96, temperature 97.9 F (36.6 C), temperature source Oral, resp. rate 18, height 4\' 11"  (1.499 m), weight 58.5 kg (129 lb), SpO2 98 %.  PHYSICAL EXAMINATION:  Constitutional: Appears  well-developed and well-nourished. No distress. HENT: Normocephalic. Marland Kitchen Oropharynx is clear and moist.  Eyes: Conjunctivae and EOM are normal. PERRLA, no scleral icterus.  Neck: Normal ROM. Neck supple. No JVD. No tracheal deviation. CVS: RRR, S1/S2 +, no murmurs, no gallops, no carotid bruit.  Pulmonary: Effort and breath sounds normal, no stridor, rhonchi, wheezes, rales.  Abdominal: Soft. BS +,  no distension, tenderness, rebound or guarding.  Musculoskeletal: Normal range of motion. No edema and no tenderness.  Neuro: Alert Oriented to name, place and time CN 2-12 grossly intact. No focal deficits. Skin: Skin is warm and dry. No rash noted. Psychiatric: Normal mood and affect.      LABORATORY PANEL:   CBC  Recent Labs Lab 11/11/16 0418  WBC 12.9*  HGB 8.2*  HCT 24.4*  PLT 373   ------------------------------------------------------------------------------------------------------------------  Chemistries   Recent Labs Lab 11/07/16 1023  11/11/16 0418  NA 135  < > 138  K 3.1*  < > 3.9  CL 101  < > 106  CO2 24  < > 24  GLUCOSE 100*  < > 118*  BUN 15  < > 24*  CREATININE 1.07*  < > 0.94  CALCIUM 8.4*  < > 8.5*  MG 1.3*  --   --   AST 27  --   --   ALT 27  --   --   ALKPHOS 49  --   --   BILITOT 0.8  --   --   < > = values in this interval not displayed. ------------------------------------------------------------------------------------------------------------------  Cardiac Enzymes No results for input(s): TROPONINI in the last 168 hours. ------------------------------------------------------------------------------------------------------------------  RADIOLOGY:  Ct Head Wo Contrast  Result Date:  11/09/2016 CLINICAL DATA:  Altered mental status EXAM: CT HEAD WITHOUT CONTRAST TECHNIQUE: Contiguous axial images were obtained from the base of the skull through the vertex without intravenous contrast. COMPARISON:  02/16/2014 FINDINGS: Brain: No acute  intracranial abnormality. Specifically, no hemorrhage, hydrocephalus, mass lesion, acute infarction, or significant intracranial injury. There is atrophy and chronic small vessel disease changes. Vascular: No hyperdense vessel or unexpected calcification. Skull: No acute calvarial abnormality. Sinuses/Orbits: Visualized paranasal sinuses and mastoids clear. Orbital soft tissues unremarkable. Other: None IMPRESSION: No acute intracranial abnormality. Atrophy, chronic microvascular disease. Electronically Signed   By: Rolm Baptise M.D.   On: 11/09/2016 10:03     ASSESSMENT AND PLAN:   [name] with history of progressive metastatic GIST dx 02/2014 and recent diagnosis of bladder tumor s/p TURBT and bilateral placement who presented with left leg pain and found to have DVT.   1. Left lower extremity DVT, nonocclusive in the common femoral vein and occlusive in the posterior tibial vein:due to underlying malignancy Patient will be transitioned from heparin to Eliquis. Plan is to receive full dose of anticoagulation today and at the time of discharge if her hemoglobin remains stable she will be on Eliquis 5 mg by mouth twice a day Will check hemoglobin in a.m.  2. Acute on chronic blood loss anemia with recent diagnosis of bladder tumor status post TURBT Status post 1 unit PRBC 8/19 with Hemoglobin relatively stable today Continue ferrous sulfate  3.history of GISTT and bladder tumor:  Appreciate urology and oncology evaluation Biopsy results pending and this will be discussed by oncologist this afternoon if results come back.  4.hypokalemia: Resolved  5. Delirium:Resolved   Management plans discussed with the nursing and patient who is in agreement CODE STATUS: full  TOTAL TIME TAKING CARE OF THIS PATIENT: 24 minutes.     POSSIBLE D/C tomorrow DEPENDING ON CLINICAL CONDITION.   Christan Ciccarelli M.D on 11/11/2016 at 9:11 AM  Between 7am to 6pm - Pager - [phone] After 6pm go to  [email or web address] - password [email or web address] [facility]  Office  [phone]  CC: Primary care physician; Rusty Aus, MD  Note: This dictation was prepared with Dragon dictation along with smaller phrase technology. Any transcriptional errors that result from this process are unintentional.

## 2016-11-11 NOTE — Progress Notes (Signed)
Notified Dr. Erlene Quan that patient is complaining of having burning at her urethra. Per MD okay tp place order for pyridium 100mg  tid PRN

## 2016-11-12 ENCOUNTER — Ambulatory Visit: Payer: PPO | Admitting: Urology

## 2016-11-12 DIAGNOSIS — M79605 Pain in left leg: Secondary | ICD-10-CM

## 2016-11-12 DIAGNOSIS — I82412 Acute embolism and thrombosis of left femoral vein: Secondary | ICD-10-CM

## 2016-11-12 DIAGNOSIS — D649 Anemia, unspecified: Secondary | ICD-10-CM

## 2016-11-12 DIAGNOSIS — R310 Gross hematuria: Secondary | ICD-10-CM

## 2016-11-12 DIAGNOSIS — D62 Acute posthemorrhagic anemia: Secondary | ICD-10-CM

## 2016-11-12 LAB — URINALYSIS, COMPLETE (UACMP) WITH MICROSCOPIC
BACTERIA UA: NONE SEEN
SPECIFIC GRAVITY, URINE: 1.031 — AB (ref 1.005–1.030)
SQUAMOUS EPITHELIAL / LPF: NONE SEEN

## 2016-11-12 LAB — PROTIME-INR
INR: 2.86
Prothrombin Time: 30.6 seconds — ABNORMAL HIGH (ref 11.4–15.2)

## 2016-11-12 LAB — CBC
HEMATOCRIT: 23 % — AB (ref 35.0–47.0)
Hemoglobin: 7.6 g/dL — ABNORMAL LOW (ref 12.0–16.0)
MCH: 33.8 pg (ref 26.0–34.0)
MCHC: 33.3 g/dL (ref 32.0–36.0)
MCV: 101.6 fL — ABNORMAL HIGH (ref 80.0–100.0)
Platelets: 391 10*3/uL (ref 150–440)
RBC: 2.26 MIL/uL — AB (ref 3.80–5.20)
RDW: 17 % — AB (ref 11.5–14.5)
WBC: 12.8 10*3/uL — AB (ref 3.6–11.0)

## 2016-11-12 LAB — HEMOGLOBIN AND HEMATOCRIT, BLOOD
HCT: 30.3 % — ABNORMAL LOW (ref 35.0–47.0)
HEMOGLOBIN: 10.2 g/dL — AB (ref 12.0–16.0)

## 2016-11-12 LAB — APTT
APTT: 106 s — AB (ref 24–36)
aPTT: 49 seconds — ABNORMAL HIGH (ref 24–36)

## 2016-11-12 LAB — PREPARE RBC (CROSSMATCH)

## 2016-11-12 LAB — HEPARIN LEVEL (UNFRACTIONATED)

## 2016-11-12 MED ORDER — SODIUM CHLORIDE 0.9 % IV SOLN
Freq: Once | INTRAVENOUS | Status: AC
  Administered 2016-11-12: 12:00:00 via INTRAVENOUS

## 2016-11-12 MED ORDER — HEPARIN (PORCINE) IN NACL 100-0.45 UNIT/ML-% IJ SOLN
900.0000 [IU]/h | INTRAMUSCULAR | Status: DC
  Administered 2016-11-12: 900 [IU]/h via INTRAVENOUS
  Filled 2016-11-12: qty 250

## 2016-11-12 NOTE — Progress Notes (Signed)
[place] for Heparin Indication: DVT  Allergies  Allergen Reactions  . Other Swelling    Magic mouthwash  . Penicillins Swelling    Has patient had a PCN reaction causing immediate rash, facial/tongue/throat swelling, SOB or lightheadedness with hypotension: No Has patient had a PCN reaction causing severe rash involving mucus membranes or skin necrosis: No Has patient had a PCN reaction that required hospitalization: No Has patient had a PCN reaction occurring within the last 10 years: No If all of the above answers are "NO", then may proceed with Cephalosporin use.    . Ciprofloxacin Diarrhea    GI distress, abd cramping    Patient Measurements: Height: 4\' 11"  (149.9 cm) Weight: 129 lb (58.5 kg) IBW/kg (Calculated) : 43.2 Heparin Dosing Weight: 55 kg  Vital Signs: Temp: 98.4 F (36.9 C) (08/23 1520) Temp Source: Oral (08/23 1520) BP: 166/84 (08/23 1520) Pulse Rate: 113 (08/23 1520)  Labs:  Recent Labs  11/10/16 6734 11/10/16 0809  11/11/16 0418 11/12/16 0424 11/12/16 0819 11/12/16 1715  HGB 9.3*  --   --  8.2* 7.6*  --  10.2*  HCT 26.8*  --   --  24.4* 23.0*  --  30.3*  PLT 400  --   --  373 391  --   --   APTT 103* 97*  < > 93*  --  49* 106*  LABPROT  --   --   --   --   --  30.6*  --   INR  --   --   --   --   --  2.86  --   HEPARINUNFRC 1.48* 1.32*  --  0.93*  --  >3.60*  --   CREATININE  --   --   --  0.94  --   --   --   < > = values in this interval not displayed.  Estimated Creatinine Clearance: 39 mL/min (by C-G formula based on SCr of 0.94 mg/dL).   Medical History: Past Medical History:  Diagnosis Date  . Anemia   . Anxiety   . Arthritis   . Cancer [facility])    GIST  . Depression   . GERD (gastroesophageal reflux disease)   . GIST (gastrointestinal stroma tumor), malignant, colon ([place])   . History of hiatal hernia   . History of kidney stones   . Hypertension    NO MEDS NOW  . IBS (irritable bowel  syndrome)   . Tremors of nervous system    HEAD    Assessment: 76 y/o F admitted with DVT and started on apixaban with last dose of 10 mg 8/19. Hematuria reported per RN. Plan is to transition to heparin drip with possible IVC filter pending. 8-hour APTT level back at 78, which is within range.   Goal of Therapy:  APTT 68-109 Heparin level 0.3-0.7 units/ml Monitor platelets by anticoagulation protocol: Yes   Plan:  Will continue heparin infusion at 900 units/hr  Check aPTT level in 8 hours and daily while on heparin Continue to monitor H&H and platelets.  [place] Resident  11/12/16 6:53 PM     8/21 2300 aPTT 99. Continue current regimen. Recheck aPTT, heparin level and CBC with tomorrow AM labs.  Sim Boast, PharmD, BCPS  11/12/16 6:53 PM   8/22 AM aPTT 93, heparin level 0.93. Continue current regimen. Recheck aPTT, heparin level, and CBC with tomorrow AM labs.  8/23 0741 pharmacy consulted to discontinue Eliquis and  re-initiate heparin. Patient had accidental fall in bathroom last night. Still has hematuria. She is not a candidate for IVCF d/t underlying malignancy and location. Baseline HL/aPTT ordered. Last Eliquis dose was 11/11/16 at 2145. We will resume heparin infusion at last known therapeutic rate 900 units/hr with no initial bolus. Will monitor using aPTT as above due to concurrent apixaban.  8/23: APTT resulted @ 106. Will continue current heparin gtt rate. Will recheck APTT @ 0100 along with HL.   Larene Beach, PharmD  11/12/16 6:53 PM

## 2016-11-12 NOTE — Plan of Care (Signed)
Problem: Safety: Goal: Ability to remain free from injury will improve Outcome: Not Progressing Pt had fall

## 2016-11-12 NOTE — Progress Notes (Signed)
Son, Gwyndolyn Saxon Lindquist notified of patient's fall w/o injury; acknowledged and appreciated the call; requested that if she should go home today, to please give him a "heads-up".  Barbaraann Faster, RN 6:52 AM; 11/12/2016

## 2016-11-12 NOTE — Progress Notes (Signed)
0410: Heard a "help"; found patient getting back into bed. Stated that she fell in the BR and hit her back and back of her head on the cabinet; no knot or hematoma noted; stated that she was..."trying to go to the BR and get lotion..." for her bottom; "I have a hard head...my head is sore."  Denies hurting anywhere else or blurry vision; VSS; skin pale, continues to c/o burning on urination and bloody urine; 0415: Dr. Marcille Blanco notified of findings; acknowledged; Tele-sitter reordered; will have safety sitter round on her in the am also; bed alarm reset on low-bed;  Lab in to draw blood; continue to monitor.Barbaraann Faster, RN 11/12/2016 5:09 AM

## 2016-11-12 NOTE — Progress Notes (Signed)
[facility] NAME: Courtney [name]    MR#:  [id number]  DATE OF BIRTH:  1940-09-25  SUBJECTIVE:   Patient HAD ACCIDENTAL FALL in bathroom last night No back pain or headache C/o burning urinary pain Still with hematuria    Wants to go home REVIEW OF SYSTEMS:    Review of Systems   Review of Systems  Constitutional: Negative for fever, chills weight loss HENT: Negative for ear pain, nosebleeds, congestion, facial swelling, rhinorrhea, neck pain, neck stiffness and ear discharge.   Respiratory: Negative for cough, shortness of breath, wheezing  Cardiovascular: Negative for chest pain, palpitations and leg swelling.  Gastrointestinal: Negative for heartburn, abdominal pain, vomiting, diarrhea or consitpation Genitourinary: Negative for dysuria, urgency, frequency, ++ hematuria  Musculoskeletal: Negative for back pain or joint pain Neurological: Negative for dizziness, seizures, syncope, focal weakness,  numbness and headaches.  Hematological: Does not bruise/bleed easily.  Psychiatric/Behavioral: Negative for hallucinations, confusion, dysphoric mood    DRUG ALLERGIES:   Allergies  Allergen Reactions  . Other Swelling    Magic mouthwash  . Penicillins Swelling    Has patient had a PCN reaction causing immediate rash, facial/tongue/throat swelling, SOB or lightheadedness with hypotension: No Has patient had a PCN reaction causing severe rash involving mucus membranes or skin necrosis: No Has patient had a PCN reaction that required hospitalization: No Has patient had a PCN reaction occurring within the last 10 years: No If all of the above answers are "NO", then may proceed with Cephalosporin use.    . Ciprofloxacin Diarrhea    GI distress, abd cramping    VITALS:  Blood pressure (!) 147/74, pulse (!) 108, temperature 98.3 F (36.8 C), temperature source Oral, resp. rate 17, height 4\' 11"  (1.499 m), weight 58.5 kg (129 lb),  SpO2 100 %.  PHYSICAL EXAMINATION:  Constitutional: Appears well-developed and well-nourished. No distress. HENT: Normocephalic. Marland Kitchen Oropharynx is clear and moist.  Eyes: Conjunctivae and EOM are normal. PERRLA, no scleral icterus.  Neck: Normal ROM. Neck supple. No JVD. No tracheal deviation. CVS: RRR, S1/S2 +, no murmurs, no gallops, no carotid bruit.  Pulmonary: Effort and breath sounds normal, no stridor, rhonchi, wheezes, rales.  Abdominal: Soft. BS +,  no distension, tenderness, rebound or guarding.  Musculoskeletal: Normal range of motion. No edema and no tenderness.  Neuro: Alert Oriented to name, place and time CN 2-12 grossly intact. No focal deficits. Skin: Skin is warm and dry. No rash noted. Psychiatric: Normal mood and affect.      LABORATORY PANEL:   CBC  Recent Labs Lab 11/12/16 0424  WBC 12.8*  HGB 7.6*  HCT 23.0*  PLT 391   ------------------------------------------------------------------------------------------------------------------  Chemistries   Recent Labs Lab 11/07/16 1023  11/11/16 0418  NA 135  < > 138  K 3.1*  < > 3.9  CL 101  < > 106  CO2 24  < > 24  GLUCOSE 100*  < > 118*  BUN 15  < > 24*  CREATININE 1.07*  < > 0.94  CALCIUM 8.4*  < > 8.5*  MG 1.3*  --   --   AST 27  --   --   ALT 27  --   --   ALKPHOS 49  --   --   BILITOT 0.8  --   --   < > = values in this interval not displayed. ------------------------------------------------------------------------------------------------------------------  Cardiac Enzymes No results for input(s): TROPONINI in the last  168 hours. ------------------------------------------------------------------------------------------------------------------  RADIOLOGY:  No results found.   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   1. Left lower extremity DVT, nonocclusive in the common femoral vein and occlusive in the posterior tibial vein:due to underlying malignancy Due to anemia and possible need for Bilateral nephrostomy tubes I will stop Eliquis and restart heparin She can not have IVCF due to underlying cancer and location on IVC.  2. 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 She will discuss case with interventional radiology   3.history of GISTT and bladder tumor:  Appreciate urology and oncology evaluation Diagnosis is still unknown. Pathology is pending. Patient has bilateral stents due to bilateral hydronephrosis.   4.hypokalemia: Resolved  5. Delirium:Resolved   Physical therapy was not recommending further PT follow-up on 11/10/2016  Management plans discussed with the nursing and patient who is in agreement CODE STATUS: full  TOTAL TIME TAKING CARE OF THIS PATIENT: 24 minutes.     POSSIBLE D/C ?? DEPENDING ON CLINICAL CONDITION.   Valerie Fredin M.D on 11/12/2016 at 7:39 AM  Between 7am to 6pm - Pager - [phone] After 6pm go to [email or web address] - password [email or web address] [facility]  Office  [phone]  CC: Primary care physician; Rusty Aus, MD  Note: This dictation was prepared with Dragon dictation along with smaller phrase technology. Any transcriptional errors that result from this process are unintentional.

## 2016-11-12 NOTE — Progress Notes (Signed)
[facility] for Heparin Indication: DVT  Allergies  Allergen Reactions  . Other Swelling    Magic mouthwash  . Penicillins Swelling    Has patient had a PCN reaction causing immediate rash, facial/tongue/throat swelling, SOB or lightheadedness with hypotension: No Has patient had a PCN reaction causing severe rash involving mucus membranes or skin necrosis: No Has patient had a PCN reaction that required hospitalization: No Has patient had a PCN reaction occurring within the last 10 years: No If all of the above answers are "NO", then may proceed with Cephalosporin use.    . Ciprofloxacin Diarrhea    GI distress, abd cramping    Patient Measurements: Height: 4\' 11"  (149.9 cm) Weight: 129 lb (58.5 kg) IBW/kg (Calculated) : 43.2 Heparin Dosing Weight: 55 kg  Vital Signs: Temp: 98.3 F (36.8 C) (08/23 0530) Temp Source: Oral (08/23 0530) BP: 147/74 (08/23 0530) Pulse Rate: 108 (08/23 0530)  Labs:  Recent Labs  11/10/16 7035 11/10/16 0809 11/10/16 1420 11/10/16 2250 11/11/16 0418 11/12/16 0424  HGB 9.3*  --   --   --  8.2* 7.6*  HCT 26.8*  --   --   --  24.4* 23.0*  PLT 400  --   --   --  373 391  APTT 103* 97* 78* 99* 93*  --   HEPARINUNFRC 1.48* 1.32*  --   --  0.93*  --   CREATININE  --   --   --   --  0.94  --     Estimated Creatinine Clearance: 39 mL/min (by C-G formula based on SCr of 0.94 mg/dL).   Medical History: Past Medical History:  Diagnosis Date  . Anemia   . Anxiety   . Arthritis   . Cancer [facility])    GIST  . Depression   . GERD (gastroesophageal reflux disease)   . GIST (gastrointestinal stroma tumor), malignant, colon ([place])   . History of hiatal hernia   . History of kidney stones   . Hypertension    NO MEDS NOW  . IBS (irritable bowel syndrome)   . Tremors of nervous system    HEAD    Assessment: 76 y/o F admitted with DVT and started on apixaban with last dose of 10 mg 8/19. Hematuria reported  per RN. Plan is to transition to heparin drip with possible IVC filter pending. 8-hour APTT level back at 78, which is within range.   Goal of Therapy:  APTT 68-109 Heparin level 0.3-0.7 units/ml Monitor platelets by anticoagulation protocol: Yes   Plan:  Will continue heparin infusion at 900 units/hr  Check aPTT level in 8 hours and daily while on heparin Continue to monitor H&H and platelets.  [place] Resident  11/12/16 7:51 AM     8/21 2300 aPTT 99. Continue current regimen. Recheck aPTT, heparin level and CBC with tomorrow AM labs.  Sim Boast, PharmD, BCPS  11/12/16 7:51 AM   8/22 AM aPTT 93, heparin level 0.93. Continue current regimen. Recheck aPTT, heparin level, and CBC with tomorrow AM labs.  8/23 0741 pharmacy consulted to discontinue Eliquis and re-initiate heparin. Patient had accidental fall in bathroom last night. Still has hematuria. She is not a candidate for IVCF d/t underlying malignancy and location. Baseline HL/aPTT ordered. Last Eliquis dose was 11/11/16 at 2145. We will resume heparin infusion at last known therapeutic rate 900 units/hr with no initial bolus. Will monitor using aPTT as above due to concurrent apixaban.  Ovid Curd  A. Jordan Hawks, PharmD, BCPS  Clinical Pharmacist 11/12/16 7:51 AM

## 2016-11-12 NOTE — OR Nursing (Signed)
Dr Erlene Quan notified that bilateral neph tube placements can not be done until eliquis stopped 48 hrs, per Dr Vernard Gambles. Last dose 2145 on 11/11/16

## 2016-11-12 NOTE — Progress Notes (Signed)
Per Dr. Erlene Quan in and out cath in order to obtain urine culture and urinalysis.

## 2016-11-12 NOTE — Consult Note (Signed)
[facility] Consult Note  MRN : [id number]  Courtney [name] is a 76 y.o. (August 22, 1940) female who presents with chief complaint of  Chief Complaint  Patient presents with  . Leg Pain  .  History of Present Illness: I am asked by Dr. Benjie Karvonen to see the patient regarding evaluation for an IVC filter.  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 She presents with lethargy, anemia, and leg pain. She had an ultrasound which I have reviewed as part of his workup which demonstrates left lower extremity DVT. She is anemic and has had falls as recent as yesterday.  There is concern about ongoing anticoagulation.    Current Facility-Administered Medications  Medication Dose Route Frequency Provider Last Rate Last Dose  . acetaminophen (TYLENOL) tablet 650 mg  650 mg Oral Q6H PRN Loletha Grayer, MD   650 mg at 11/10/16 1042   Or  . acetaminophen (TYLENOL) suppository 650 mg  650 mg Rectal Q6H PRN Wieting, Richard, MD      . bisacodyl (DULCOLAX) suppository 10 mg  10 mg Rectal Daily Bettey Costa, MD   10 mg at 11/11/16 1238  . cholecalciferol (VITAMIN D) tablet 1,000 Units  1,000 Units Oral Daily Loletha Grayer, MD   1,000 Units at 11/12/16 1118  . cyanocobalamin tablet 1,000 mcg  1,000 mcg Oral Daily Loletha Grayer, MD   1,000 mcg at 11/12/16 1118  . docusate sodium (COLACE) capsule 100 mg  100 mg Oral BID Bettey Costa, MD   100 mg at 11/12/16 1118  . ferrous sulfate tablet 325 mg  325 mg Oral TID WC Wieting, Richard, MD   325 mg at 11/12/16 1256  . heparin ADULT infusion 100 units/mL (25000 units/270mL sodium chloride 0.45%)  900 Units/hr Intravenous Continuous Mody, Sital, MD 9 mL/hr at 11/12/16 1255 900 Units/hr at 11/12/16 1255  . hydrALAZINE (APRESOLINE) injection 10 mg  10 mg Intravenous Q4H PRN Saundra Shelling, MD   10 mg at 11/10/16 0112  . morphine 2 MG/ML injection 1 mg  1 mg Intravenous Q4H PRN Hugelmeyer, Alexis, DO    1 mg at 11/08/16 2007  . morphine 2 MG/ML injection 2 mg  2 mg Intravenous Once Nena Polio, MD      . multivitamin with minerals tablet 1 tablet  1 tablet Oral Daily Loletha Grayer, MD   1 tablet at 11/11/16 1001  . multivitamin-lutein (OCUVITE-LUTEIN) capsule 2 capsule  2 capsule Oral Daily Loletha Grayer, MD   2 capsule at 11/12/16 1120  . ondansetron (ZOFRAN) injection 4 mg  4 mg Intravenous Q8H PRN Hugelmeyer, Alexis, DO   4 mg at 11/10/16 0113  . oxyCODONE (Oxy IR/ROXICODONE) immediate release tablet 5 mg  5 mg Oral Q6H PRN Loletha Grayer, MD   5 mg at 11/11/16 1402  . phenazopyridine (PYRIDIUM) tablet 100 mg  100 mg Oral TID PRN Hollice Espy, MD   100 mg at 11/11/16 1746  . polyethylene glycol (MIRALAX / GLYCOLAX) packet 17 g  17 g Oral Daily Bettey Costa, MD   17 g at 11/11/16 1238  . potassium chloride SA (K-DUR,KLOR-CON) CR tablet 20 mEq  20 mEq Oral Daily Loletha Grayer, MD   20 mEq at 11/12/16 1118  . risperiDONE (RISPERDAL) tablet 0.25 mg  0.25 mg Oral BID PRN Bettey Costa, MD   0.25 mg at 11/09/16 3536    Past Medical History:  Diagnosis Date  . Anemia   .  Anxiety   . Arthritis   . Cancer [facility])    GIST  . Depression   . GERD (gastroesophageal reflux disease)   . GIST (gastrointestinal stroma tumor), malignant, colon ([facility])   . History of hiatal hernia   . History of kidney stones   . Hypertension    NO MEDS NOW  . IBS (irritable bowel syndrome)   . Tremors of nervous system    HEAD    Past Surgical History:  Procedure Laterality Date  . ABDOMINAL HYSTERECTOMY    . BREAST BIOPSY Right   . CATARACT EXTRACTION W/PHACO Right 01/28/2015   Procedure: CATARACT EXTRACTION PHACO AND INTRAOCULAR LENS PLACEMENT (IOC);  Surgeon: Estill Cotta, MD;  Location: [facility] ORS;  Service: Ophthalmology;  Laterality: Right;  [name] 00:59.3 AP% 22.6 CDE 24.69 fluid pack lot #[id number]  . CATARACT EXTRACTION W/PHACO Left 02/11/2015   Procedure: CATARACT EXTRACTION PHACO AND  INTRAOCULAR LENS PLACEMENT (IOC);  Surgeon: Estill Cotta, MD;  Location: [facility] ORS;  Service: Ophthalmology;  Laterality: Left;  [name] 01:18 AP% 22.8 CDE 32.38  fluid pack lot # [id number]  . CHOLECYSTECTOMY    . COLON SURGERY     COLOSTOMY AND REVERSAL  . CYSTOSCOPY W/ RETROGRADES Bilateral 11/02/2016   Procedure: CYSTOSCOPY WITH RETROGRADE PYELOGRAM;  Surgeon: Hollice Espy, MD;  Location: [facility] ORS;  Service: Urology;  Laterality: Bilateral;  . CYSTOSCOPY WITH STENT PLACEMENT Bilateral 11/02/2016   Procedure: CYSTOSCOPY WITH STENT PLACEMENT;  Surgeon: Hollice Espy, MD;  Location: [facility] ORS;  Service: Urology;  Laterality: Bilateral;  . STOMACH SURGERY     CANCER  . TRANSURETHRAL RESECTION OF BLADDER TUMOR N/A 11/02/2016   Procedure: TRANSURETHRAL RESECTION OF BLADDER TUMOR (TURBT) >5CM;  Surgeon: Hollice Espy, MD;  Location: [facility] ORS;  Service: Urology;  Laterality: N/A;    Social History Social History  Substance Use Topics  . Smoking status: Never Smoker  . Smokeless tobacco: Never Used  . Alcohol use No  No IVDU  Family History Family History  Problem Relation Age of Onset  . Breast cancer Maternal Grandmother   . CAD Mother   . CAD Father   No bleeding or clotting disorders  Allergies  Allergen Reactions  . Other Swelling    Magic mouthwash  . Penicillins Swelling    Has patient had a PCN reaction causing immediate rash, facial/tongue/throat swelling, SOB or lightheadedness with hypotension: No Has patient had a PCN reaction causing severe rash involving mucus membranes or skin necrosis: No Has patient had a PCN reaction that required hospitalization: No Has patient had a PCN reaction occurring within the last 10 years: No If all of the above answers are "NO", then may proceed with Cephalosporin use.    . Ciprofloxacin Diarrhea    GI distress, abd cramping     REVIEW OF SYSTEMS (Negative unless checked)  Constitutional: [x] Weight loss  [] Fever   [] Chills Cardiac: [] Chest pain   [] Chest pressure   [] Palpitations   [] Shortness of breath when laying flat   [] Shortness of breath at rest   [] Shortness of breath with exertion. Vascular:  [] Pain in legs with walking   [x] Pain in legs at rest   [] Pain in legs when laying flat   [] Claudication   [] Pain in feet when walking  [] Pain in feet at rest  [] Pain in feet when laying flat   [x] History of DVT   [x] Phlebitis   [x] Swelling in legs   [] Varicose veins   [] Non-healing ulcers Pulmonary:   [] Uses home oxygen   []   Productive cough   [] Hemoptysis   [] Wheeze  [] COPD   [] Asthma Neurologic:  [] Dizziness  [] Blackouts   [] Seizures   [] History of stroke   [] History of TIA  [] Aphasia   [] Temporary blindness   [] Dysphagia   [] Weakness or numbness in arms   [] Weakness or numbness in legs Musculoskeletal:  [x] Arthritis   [] Joint swelling   [] Joint pain   [x] Low back pain Hematologic:  [] Easy bruising  [] Easy bleeding   [] Hypercoagulable state   [x] Anemic  [] Hepatitis Gastrointestinal:  [] Blood in stool   [] Vomiting blood  [] Gastroesophageal reflux/heartburn   [x] Difficulty swallowing. Genitourinary:  [] Chronic kidney disease   [] Difficult urination  [] Frequent urination  [] Burning with urination   [x] Blood in urine Skin:  [] Rashes   [] Ulcers   [] Wounds Psychological:  [x] History of anxiety   [x]  History of major depression.  Physical Examination  Vitals:   11/12/16 0530 11/12/16 1142 11/12/16 1222 11/12/16 1520  BP: (!) 147/74 (!) 150/60 (!) 150/87 (!) 166/84  Pulse: (!) 108 (!) 115 (!) 117 (!) 113  Resp: 17 (!) 22 18 18   Temp: 98.3 F (36.8 C) 97.6 F (36.4 C) 98 F (36.7 C) 98.4 F (36.9 C)  TempSrc: Oral Oral Oral Oral  SpO2: 100% 100% 100% 98%  Weight:      Height:       Body mass index is 26.05 kg/m. Gen:  Debilitated and frail appearing. NAD Head: [place]/AT, + temporalis wasting.  Ear/Nose/Throat: Hearing grossly intact, nares w/o erythema or drainage, oropharynx w/o Erythema/Exudate Eyes:  Sclera non-icteric, conjunctiva clear Neck: Trachea midline.  No JVD.  Pulmonary:  Good air movement, respirations not labored, equal bilaterally.  Cardiac: RRR, normal S1, S2. Vascular:  Vessel Right Left  Radial Palpable Palpable                                   Gastrointestinal: mildly tender, distended. Musculoskeletal: M/S 5/5 throughout.  Extremities without ischemic changes.  No deformity or atrophy. 1+ RLE edema, 2+ LLE edema Neurologic: Sensation grossly intact in extremities.  Symmetrical.  Speech is fluent. Motor exam as listed above. Psychiatric: Judgment intact, Mood & affect appropriate for pt's clinical situation. Dermatologic: No rashes or ulcers noted.  No cellulitis or open wounds. Lymph : + Inguinal lymphadenopathy.      CBC Lab Results  Component Value Date   WBC 12.8 (H) 11/12/2016   HGB 7.6 (L) 11/12/2016   HCT 23.0 (L) 11/12/2016   MCV 101.6 (H) 11/12/2016   PLT 391 11/12/2016    BMET    Component Value Date/Time   NA 138 11/11/2016 0418   NA 140 07/12/2014 0902   K 3.9 11/11/2016 0418   K 3.1 (L) 07/12/2014 0902   CL 106 11/11/2016 0418   CL 104 07/12/2014 0902   CO2 24 11/11/2016 0418   CO2 31 07/12/2014 0902   GLUCOSE 118 (H) 11/11/2016 0418   GLUCOSE 100 (H) 07/12/2014 0902   BUN 24 (H) 11/11/2016 0418   BUN 13 07/12/2014 0902   CREATININE 0.94 11/11/2016 0418   CREATININE 0.49 07/12/2014 0902   CALCIUM 8.5 (L) 11/11/2016 0418   CALCIUM 9.1 07/12/2014 0902   GFRNONAA 57 (L) 11/11/2016 0418   GFRNONAA >60 07/12/2014 0902   GFRAA >60 11/11/2016 0418   GFRAA >60 07/12/2014 0902   Estimated Creatinine Clearance: 39 mL/min (by C-G formula based on SCr of 0.94 mg/dL).  COAG Lab Results  Component Value Date   INR 2.86 11/12/2016   INR 1.97 11/08/2016   INR 1.1 02/20/2014    Radiology Ct Head Wo Contrast  Result Date: 11/09/2016 CLINICAL DATA:  Altered mental status EXAM: CT HEAD WITHOUT CONTRAST TECHNIQUE: Contiguous  axial images were obtained from the base of the skull through the vertex without intravenous contrast. COMPARISON:  02/16/2014 FINDINGS: Brain: No acute intracranial abnormality. Specifically, no hemorrhage, hydrocephalus, mass lesion, acute infarction, or significant intracranial injury. There is atrophy and chronic small vessel disease changes. Vascular: No hyperdense vessel or unexpected calcification. Skull: No acute calvarial abnormality. Sinuses/Orbits: Visualized paranasal sinuses and mastoids clear. Orbital soft tissues unremarkable. Other: None IMPRESSION: No acute intracranial abnormality. Atrophy, chronic microvascular disease. Electronically Signed   By: Rolm Baptise M.D.   On: 11/09/2016 10:03   Ct Abdomen Pelvis W Contrast  Result Date: 11/07/2016 CLINICAL DATA:  Pt states she's been having back and abdominal pain, pain increases with movement, pt states last Monday bladder stints placed, hx epidural, hernia repair, GB, GERD, hyst. EXAM: CT ABDOMEN AND PELVIS WITH CONTRAST TECHNIQUE: Multidetector CT imaging of the abdomen and pelvis was performed using the standard protocol following bolus administration of intravenous contrast. CONTRAST:  29mL ISOVUE-300 IOPAMIDOL (ISOVUE-300) INJECTION 61% COMPARISON:  06/01/2016 FINDINGS: Lower chest: 4 mm nodule, left lower lobe, image 4, series 4, new since the prior CT. Minor dependent subsegmental atelectasis. No acute findings. Hepatobiliary: No focal liver abnormality is seen. Status post cholecystectomy. No biliary dilatation. Pancreas: Unremarkable. No pancreatic ductal dilatation or surrounding inflammatory changes. Spleen: Unremarkable. Adrenals/Urinary Tract: No adrenal masses. Bilateral ureteral stents appear well-positioned. Mild left and mild-to-moderate right renal collecting system dilation. Small low-density mass consistent with a cyst from the superior margin of the left kidney, measuring 7 mm. No other renal masses or lesions. No stones.  There is irregular wall thickening of the posterior inferior bladder. Bladder is partly decompressed by Foley catheter. Stomach/Bowel: Moderate size hiatal hernia. Stomach otherwise unremarkable. There has been a previous partial colon resection. There are right and left: Bowel anastomosis staple lines. A few left colon diverticula noted. There is no evidence diverticulitis or other colonic inflammatory process. The small bowel is unremarkable. Normal appendix is visualized. There is a 16 mm nodule adjacent to the left transverse colon in the left upper quadrant, within the peritoneal cavity. Vascular/Lymphatic: There is bulky retroperitoneal adenopathy. Several reference measurements were made. An enlarged node or confluence of lymph nodes surrounds the aorta at the level of the of the renal vessels. An aortocaval portion of this measures 2.4 cm in short axis. There are enlarged pelvic lymph nodes. Largest is a left external iliac chain node measuring 3.7 x 2.8 cm. There is an enlarged left inguinal lymph node measuring 1.6 cm in short axis. There is aortic and branch vessel atherosclerotic calcifications. No aneurysm or dissection. The inferior vena cava is compressed have the level of the retroperitoneal adenopathy. There is also compression of common iliac veins. There may be thrombus in both common femoral veins versus mixed unenhanced and enhanced blood. Reproductive: Status post hysterectomy. No adnexal masses. Other: Trace ascites is seen inferior to the pancreas and along the left anterior pararenal fascia, and collecting in the posterior pelvic recess. Musculoskeletal: No acute fracture. No osteoblastic or osteolytic lesions. IMPRESSION: 1. There is bulky retroperitoneal, pelvic and left inguinal adenopathy, new since the prior CT. Nodes compress the inferior vena cava and iliac veins. There may be thrombus in the common femoral veins. Consider follow-up  bilateral lower extremity venous Doppler evaluation.  2. Bilateral ureteral stents are well positioned. There is mild to moderate right and mild left hydronephrosis. 3. Irregular wall thickening of the posterior and inferior bladder. This may be inflammatory or reflect neoplasm. 4. Moderate hiatal hernia. 5. 16 mm soft tissue nodule in the left upper quadrant anterior peritoneal cavity a reflect an enlarged lymph node or a neoplastic peritoneal deposit. 6. Trace ascites. 7. Aortic atherosclerosis. Electronically Signed   By: Lajean Manes M.D.   On: 11/07/2016 13:47   US Renal  Result Date: 10/27/2016 CLINICAL DATA:  Acute renal failure EXAM: RENAL / URINARY TRACT ULTRASOUND COMPLETE COMPARISON:  None. FINDINGS: Right Kidney: Length: 10.9 cm. Echogenicity and renal cortical thickness are within normal limits. No mass or perinephric fluid visualized. There is moderate hydronephrosis on the right. No sonographically demonstrable calculus or ureterectasis. Left Kidney: Length: 9.5 cm. Echogenicity and renal cortical thickness are within normal limits. No mass or perinephric fluid visualized. There is moderate hydronephrosis on the left. No sonographically demonstrable calculus or ureterectasis. Bladder: There is a hypoechoic lesion along the posterior aspect of the urinary bladder which shows moderate vascularity measuring 4.6 x 2.0 cm. This mass may be infiltrating the posterior bladder wall. Note that there is slight postvoid residual in the urinary bladder. IMPRESSION: Hypoechoic apparent mass along the posterior aspect of the urinary bladder which may be infiltrating into the posterior bladder wall. There is moderate hydronephrosis bilaterally, possibly due to a degree of obstruction of the ureterovesical junctions from the apparent bladder tumor. There is mild postvoid residual in the urinary bladder. These results will be called to the ordering clinician or representative by the Radiologist Assistant, and communication documented in the PACS or zVision Dashboard.  Electronically Signed   By: Lowella Grip III M.D.   On: 10/27/2016 14:45   US Venous Img Lower Bilateral  Result Date: 11/07/2016 CLINICAL DATA:  Left leg pain. Possible femoral vein thrombosis on CT. EXAM: BILATERAL LOWER EXTREMITY VENOUS DOPPLER ULTRASOUND TECHNIQUE: Gray-scale sonography with graded compression, as well as color Doppler and duplex ultrasound were performed to evaluate the lower extremity deep venous systems from the level of the common femoral vein and including the common femoral, femoral, profunda femoral, popliteal and calf veins including the posterior tibial, peroneal and gastrocnemius veins when visible. The superficial great saphenous vein was also interrogated. Spectral Doppler was utilized to evaluate flow at rest and with distal augmentation maneuvers in the common femoral, femoral and popliteal veins. COMPARISON:  None. FINDINGS: RIGHT LOWER EXTREMITY Common Femoral Vein: No evidence of thrombus. Normal compressibility, respiratory phasicity and response to augmentation. Saphenofemoral Junction: No evidence of thrombus. Profunda Femoral Vein: No evidence of thrombus. Femoral Vein: No evidence of thrombus. Popliteal Vein: No evidence of thrombus. Calf Veins: No evidence of thrombus. Superficial Great Saphenous Vein: No evidence of thrombus. LEFT LOWER EXTREMITY Common Femoral Vein: Nonocclusive luminal clot is seen within the partially compressible vessel. There is neighboring bulky adenopathy in this patient with known malignancy. Saphenofemoral Junction: No evidence of thrombus. Profunda Femoral Vein: No evidence of thrombus. Femoral Vein: No evidence of thrombus. Popliteal Vein: No evidence of thrombus. Calf Veins: Thrombus within a posterior tibial vein, occlusive. Other Findings: Diffusely monophasic flow correlating with pelvic retroperitoneal adenopathy impinging on the iliac vein. IMPRESSION: 1. Confirmed left lower extremity DVT, nonocclusive in the common femoral vein  and occlusive in the posterior tibial vein. 2. Monophasic flow within left lower extremity veins related to known nodal compression  in the pelvis. Electronically Signed   By: Monte Fantasia M.D.   On: 11/07/2016 16:10      Assessment/Plan 1. LLE DVT with anemia, severe malignancy, falls and multiple other ongoing issues.  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 The only location that would be potentially amenable to IVC filter placement would be the suprarenal or retrohepatic vena cava. Filters in this location have a much higher morbidity and with her bulky retroperitoneal tumor, I would be extremely concerned about thrombosis of the filter in this location which would likely lead to renal vein thrombosis and renal failure. I do not believe IVC filter placement in her at this time is in her best interest. If she has symptomatic improvement in terms of the bulky tumor in the retroperitoneum, this could certainly be an option going forward. I would consider a low dose of anticoagulation if possible. 2. Bladder tumor and GI stromal tumor. Not entirely clear what type of malignancy as the current problem. Oncology and urology are both following. 3. Anemia. Makes anticoagulation difficult. Transfuse as needed.  This is a very difficult and complicated situation. The patient absolutely cannot tolerate anticoagulation is going to have to have a major abdominal or pelvic surgery with high risk of embolization, a suprarenal filter could be considered. This would increase overall morbidity and I would be worried about filter thrombosis in this location potentially being a fatal problem. At this time, the patient would like to avoid filter placement which I think is reasonable.   Leotis Pain, MD  11/12/2016 4:46 PM    This note was created with Dragon  medical transcription system.  Any error is purely unintentional

## 2016-11-12 NOTE — Progress Notes (Signed)
D/w dr B we will get opinion by vascular surgery for IVCF

## 2016-11-12 NOTE — Progress Notes (Signed)
Urology Consult Follow Up  Subjective: Patient fell last evening due to slipping on the floor.  She denies any lightheadedness or loss of consciousness prior or after the fall.  She states her head is sore.  She is still having "burning in her uterus" and having red urine.  She states she has passed some clots.  Hbg down to 7.6 this am.  Preacher at bedside.    Anti-infectives: Anti-infectives    None      Current Facility-Administered Medications  Medication Dose Route Frequency Provider Last Rate Last Dose  . 0.9 %  sodium chloride infusion   Intravenous Once Bettey Costa, MD      . acetaminophen (TYLENOL) tablet 650 mg  650 mg Oral Q6H PRN Loletha Grayer, MD   650 mg at 11/10/16 1042   Or  . acetaminophen (TYLENOL) suppository 650 mg  650 mg Rectal Q6H PRN Loletha Grayer, MD      . apixaban Arne Cleveland) tablet 10 mg  10 mg Oral BID Candelaria Stagers, RPH   10 mg at 11/11/16 2145  . bisacodyl (DULCOLAX) suppository 10 mg  10 mg Rectal Daily Bettey Costa, MD   10 mg at 11/11/16 1238  . cholecalciferol (VITAMIN D) tablet 1,000 Units  1,000 Units Oral Daily Loletha Grayer, MD   1,000 Units at 11/11/16 1001  . cyanocobalamin tablet 1,000 mcg  1,000 mcg Oral Daily Loletha Grayer, MD   1,000 mcg at 11/11/16 1001  . docusate sodium (COLACE) capsule 100 mg  100 mg Oral BID Bettey Costa, MD   100 mg at 11/11/16 2145  . ferrous sulfate tablet 325 mg  325 mg Oral TID WC Loletha Grayer, MD   325 mg at 11/11/16 1746  . hydrALAZINE (APRESOLINE) injection 10 mg  10 mg Intravenous Q4H PRN Saundra Shelling, MD   10 mg at 11/10/16 0112  . morphine 2 MG/ML injection 1 mg  1 mg Intravenous Q4H PRN Hugelmeyer, Alexis, DO   1 mg at 11/08/16 2007  . morphine 2 MG/ML injection 2 mg  2 mg Intravenous Once Nena Polio, MD      . multivitamin with minerals tablet 1 tablet  1 tablet Oral Daily Loletha Grayer, MD   1 tablet at 11/11/16 1001  . multivitamin-lutein (OCUVITE-LUTEIN) capsule 2 capsule  2 capsule  Oral Daily Loletha Grayer, MD   2 capsule at 11/11/16 1001  . ondansetron (ZOFRAN) injection 4 mg  4 mg Intravenous Q8H PRN Hugelmeyer, Alexis, DO   4 mg at 11/10/16 0113  . oxyCODONE (Oxy IR/ROXICODONE) immediate release tablet 5 mg  5 mg Oral Q6H PRN Loletha Grayer, MD   5 mg at 11/11/16 1402  . phenazopyridine (PYRIDIUM) tablet 100 mg  100 mg Oral TID PRN Hollice Espy, MD   100 mg at 11/11/16 1746  . polyethylene glycol (MIRALAX / GLYCOLAX) packet 17 g  17 g Oral Daily Bettey Costa, MD   17 g at 11/11/16 1238  . potassium chloride SA (K-DUR,KLOR-CON) CR tablet 20 mEq  20 mEq Oral Daily Loletha Grayer, MD   20 mEq at 11/11/16 1002  . risperiDONE (RISPERDAL) tablet 0.25 mg  0.25 mg Oral BID PRN Bettey Costa, MD   0.25 mg at 11/09/16 0936     Objective: Vital signs in last 24 hours: Temp:  [97.8 F (36.6 C)-98.6 F (37 C)] 98.3 F (36.8 C) (08/23 0530) Pulse Rate:  [105-119] 108 (08/23 0530) Resp:  [17-20] 17 (08/23 0530) BP: (147-161)/(70-81) 147/74 (08/23 0530) SpO2:  [  94 %-100 %] 100 % (08/23 0530)  Intake/Output from previous day: 08/22 0701 - 08/23 0700 In: 265 [P.O.:240; I.V.:25] Out: 50 [Urine:50] Intake/Output this shift: No intake/output data recorded.   Physical Exam Constitutional: Well nourished. Alert and oriented, No acute distress. HEENT: [place] AT, moist mucus membranes. Trachea midline, no masses. No hematoma on head.   Cardiovascular: No clubbing, cyanosis, or edema. Respiratory: Normal respiratory effort, no increased work of breathing. GI: Abdomen is soft, non tender, non distended, no abdominal masses. GU: No CVA tenderness.  No bladder fullness or masses.   Skin: No rashes, bruises or suspicious lesions. Lymph: No cervical or inguinal adenopathy. Neurologic: Grossly intact, no focal deficits, moving all 4 extremities. Psychiatric: Normal mood and affect.  Lab Results:   Recent Labs  11/11/16 0418 11/12/16 0424  WBC 12.9* 12.8*  HGB 8.2* 7.6*   HCT 24.4* 23.0*  PLT 373 391   BMET  Recent Labs  11/11/16 0418  NA 138  K 3.9  CL 106  CO2 24  GLUCOSE 118*  BUN 24*  CREATININE 0.94  CALCIUM 8.5*   PT/INR No results for input(s): LABPROT, INR in the last 72 hours. ABG No results for input(s): PHART, HCO3 in the last 72 hours.  Invalid input(s): PCO2, PO2  Studies/Results: No results found.   Assessment and Plan 1. 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  Discussed with pathologist and Dr. Tish Men aware.  2. 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 Transitioned from heparin to Eliquis  3. Hematuria- anticipated in the setting of bilateral ureteral stents, recent TUR procedure, and anticoagulation. Hemoglobin continues to trend downward slightly today with persistent hematuria. May have nephrostomy tubes placed later today  4. Bilateral hydro- s/p stent, having stent discomfort -  resolved with Cr back to baseline - ureter stents may be removed if nephrostomy tube placement is successful  Urology will continue to follow.     LOS: 5 days    [facility] 11/12/2016

## 2016-11-12 NOTE — Progress Notes (Signed)
Courtney [name]   DOB:02-Nov-1940   QQ#:[id number]    Subjective: Patient continues to have intermittent bleeding from the bladder. Complains of dysuria. Patient had a drop of hemoglobin to 7.6. Patient is eager to go home.  Review of system: Denies any nausea vomiting. Denies any chest pain or shortness of breath.  Objective:  Vitals:   11/12/16 1520 11/12/16 2010  BP: (!) 166/84 (!) 148/74  Pulse: (!) 113 (!) 115  Resp: 18 (!) 24  Temp: 98.4 F (36.9 C) 98.2 F (36.8 C)  SpO2: 98% 98%     Intake/Output Summary (Last 24 hours) at 11/12/16 2152 Last data filed at 11/12/16 2100  Gross per 24 hour  Intake              493 ml  Output              300 ml  Net              193 ml    GENERAL: Well-nourished well-developed; Thin built.. Alert, no distress and comfortable.   Alone.  EYES: no pallor or icterus OROPHARYNX: no thrush or ulceration. NECK: supple, no masses felt LYMPH:  no palpable lymphadenopathy in the cervical, axillary or inguinal regions LUNGS: decreased breath sounds to auscultation at bases and  No wheeze or crackles HEART/CVS: regular rate & rhythm and no murmurs; swelling/ pain of Left LE.  ABDOMEN: abdomen soft, non-tender and normal bowel sounds;Marland Kitchen  Musculoskeletal:no cyanosis of digits and no clubbing  PSYCH: alert & oriented x 3  NEURO: no focal motor/sensory deficits SKIN:  no rashes or significant lesions   Labs:  Lab Results  Component Value Date   WBC 12.8 (H) 11/12/2016   HGB 10.2 (L) 11/12/2016   HCT 30.3 (L) 11/12/2016   MCV 101.6 (H) 11/12/2016   PLT 391 11/12/2016   NEUTROABS 6.3 11/07/2016    Lab Results  Component Value Date   NA 138 11/11/2016   K 3.9 11/11/2016   CL 106 11/11/2016   CO2 24 11/11/2016    Studies:  No results found.  Assessment & Plan:   # 76 year old female patient with history of Gist tumor abdomen/pelvis-currently in the hospital for left lower extremity DVT  # Left lower extremity DVT-  Likely secondary to  malignancy/ mechanical obstruction at level of IVC. Currently on Eliquis- However discontinued because a drop in hemoglobin/continued hematuria. Recommend vascular consultation for possible IVC filter.  # Hematuria- status post recent stenting for bilateral hydronephrosis; TURBT. Hematuria not improving-the drop in hemoglobin; patient started back on IV heparin. Urology following.  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 Preliminary pathology on TURBT/cystoscopy positive for "high-grade malignancy-morphologically appears different from her previous Gist tumor". However, I clinically suspect this is recurrence of her previous Gist tumor.  Discussed with Dr.Onley/reviewed at tumor conference on 08/23. Awaiting pathology review at [facility].  # Anemia- multifactorial- underlying disease/hematuria. No blood transfusion ordered.  # Long discussion the patient's husband, Francee Piccolo over the phone- updated of the recent developments.  Patient follow-up with me in [place] on 8/28 with labs. Discussed with Dr.Mody.   Cammie Sickle, MD 11/12/2016  9:52 PM

## 2016-11-13 ENCOUNTER — Other Ambulatory Visit: Payer: Self-pay | Admitting: *Deleted

## 2016-11-13 ENCOUNTER — Inpatient Hospital Stay: Payer: PPO

## 2016-11-13 ENCOUNTER — Other Ambulatory Visit: Payer: Self-pay | Admitting: Internal Medicine

## 2016-11-13 LAB — BASIC METABOLIC PANEL
ANION GAP: 11 (ref 5–15)
Anion gap: 12 (ref 5–15)
BUN: 61 mg/dL — ABNORMAL HIGH (ref 6–20)
BUN: 66 mg/dL — AB (ref 6–20)
CHLORIDE: 102 mmol/L (ref 101–111)
CHLORIDE: 101 mmol/L (ref 101–111)
CO2: 20 mmol/L — ABNORMAL LOW (ref 22–32)
CO2: 19 mmol/L — AB (ref 22–32)
Calcium: 9 mg/dL (ref 8.9–10.3)
Calcium: 8.4 mg/dL — ABNORMAL LOW (ref 8.9–10.3)
Creatinine, Ser: 3.01 mg/dL — ABNORMAL HIGH (ref 0.44–1.00)
Creatinine, Ser: 2.93 mg/dL — ABNORMAL HIGH (ref 0.44–1.00)
GFR calc Af Amer: 16 mL/min — ABNORMAL LOW (ref >60)
GFR calc Af Amer: 17 mL/min — ABNORMAL LOW (ref >60)
GFR calc non Af Amer: 14 mL/min — ABNORMAL LOW (ref >60)
GFR calc non Af Amer: 14 mL/min — ABNORMAL LOW (ref >60)
GLUCOSE: 135 mg/dL — AB (ref 65–99)
Glucose, Bld: 128 mg/dL — ABNORMAL HIGH (ref 65–99)
POTASSIUM: 4.2 mmol/L (ref 3.5–5.1)
POTASSIUM: 5.3 mmol/L — AB (ref 3.5–5.1)
SODIUM: 134 mmol/L — AB (ref 135–145)
Sodium: 131 mmol/L — ABNORMAL LOW (ref 135–145)

## 2016-11-13 LAB — CBC
HCT: 21.2 % — ABNORMAL LOW (ref 35.0–47.0)
HEMATOCRIT: 24.9 % — AB (ref 35.0–47.0)
HEMOGLOBIN: 7.3 g/dL — AB (ref 12.0–16.0)
HEMOGLOBIN: 8.6 g/dL — AB (ref 12.0–16.0)
MCH: 33 pg (ref 26.0–34.0)
MCH: 32.8 pg (ref 26.0–34.0)
MCHC: 34.6 g/dL (ref 32.0–36.0)
MCHC: 34.4 g/dL (ref 32.0–36.0)
MCV: 95.4 fL (ref 80.0–100.0)
MCV: 95.3 fL (ref 80.0–100.0)
PLATELETS: 336 10*3/uL (ref 150–440)
Platelets: 319 10*3/uL (ref 150–440)
RBC: 2.22 MIL/uL — AB (ref 3.80–5.20)
RBC: 2.61 MIL/uL — AB (ref 3.80–5.20)
RDW: 18.8 % — ABNORMAL HIGH (ref 11.5–14.5)
RDW: 18.6 % — ABNORMAL HIGH (ref 11.5–14.5)
WBC: 18.4 10*3/uL — ABNORMAL HIGH (ref 3.6–11.0)
WBC: 20.4 10*3/uL — AB (ref 3.6–11.0)

## 2016-11-13 LAB — APTT
aPTT: 158 seconds — ABNORMAL HIGH (ref 24–36)
aPTT: 97 seconds — ABNORMAL HIGH (ref 24–36)

## 2016-11-13 LAB — HEPARIN LEVEL (UNFRACTIONATED): Heparin Unfractionated: >3.6 IU/mL — ABNORMAL HIGH (ref 0.30–0.70)

## 2016-11-13 LAB — PREPARE RBC (CROSSMATCH)

## 2016-11-13 MED ORDER — CEFAZOLIN SODIUM-DEXTROSE 2-4 GM/100ML-% IV SOLN
2.0000 g | Freq: Once | INTRAVENOUS | Status: AC
  Administered 2016-11-14: 2 g via INTRAVENOUS
  Filled 2016-11-13: qty 100

## 2016-11-13 MED ORDER — SODIUM CHLORIDE 0.9 % IV SOLN
INTRAVENOUS | Status: DC
  Administered 2016-11-13 – 2016-11-15 (×6): via INTRAVENOUS
  Administered 2016-11-16: 1000 mL via INTRAVENOUS

## 2016-11-13 MED ORDER — REGORAFENIB 40 MG PO TABS: 80.0000 mg | ORAL_TABLET | Freq: Every day | ORAL | 4 refills | Status: DC

## 2016-11-13 MED ORDER — SODIUM POLYSTYRENE SULFONATE 15 GM/60ML PO SUSP
30.0000 g | Freq: Once | ORAL | Status: AC
  Administered 2016-11-13: 30 g via ORAL
  Filled 2016-11-13: qty 120

## 2016-11-13 MED ORDER — ACETAMINOPHEN 325 MG PO TABS
650.0000 mg | ORAL_TABLET | Freq: Once | ORAL | Status: AC
  Administered 2016-11-13: 650 mg via ORAL
  Filled 2016-11-13: qty 2

## 2016-11-13 MED ORDER — HEPARIN (PORCINE) IN NACL 100-0.45 UNIT/ML-% IJ SOLN
800.0000 [IU]/h | INTRAMUSCULAR | Status: DC
  Administered 2016-11-13: 800 [IU]/h via INTRAVENOUS

## 2016-11-13 MED ORDER — BELLADONNA ALKALOIDS-OPIUM 16.2-60 MG RE SUPP
1.0000 | Freq: Three times a day (TID) | RECTAL | Status: DC | PRN
  Administered 2016-11-14: 1 via RECTAL
  Filled 2016-11-13 (×2): qty 1

## 2016-11-13 MED ORDER — SODIUM CHLORIDE 0.9 % IV SOLN
Freq: Once | INTRAVENOUS | Status: AC
  Administered 2016-11-13: 18:00:00 via INTRAVENOUS

## 2016-11-13 NOTE — Progress Notes (Signed)
[place] for Heparin Indication: DVT  Allergies  Allergen Reactions  . Other Swelling    Magic mouthwash  . Penicillins Swelling    Has patient had a PCN reaction causing immediate rash, facial/tongue/throat swelling, SOB or lightheadedness with hypotension: No Has patient had a PCN reaction causing severe rash involving mucus membranes or skin necrosis: No Has patient had a PCN reaction that required hospitalization: No Has patient had a PCN reaction occurring within the last 10 years: No If all of the above answers are "NO", then may proceed with Cephalosporin use.    . Ciprofloxacin Diarrhea    GI distress, abd cramping    Patient Measurements: Height: 4\' 11"  (149.9 cm) Weight: 129 lb (58.5 kg) IBW/kg (Calculated) : 43.2 Heparin Dosing Weight: 55 kg  Vital Signs: Temp: 98.2 F (36.8 C) (08/23 2010) Temp Source: Oral (08/23 2010) BP: 148/74 (08/23 2010) Pulse Rate: 115 (08/23 2010)  Labs:  Recent Labs  11/11/16 0418 11/12/16 0424 11/12/16 0819 11/12/16 1715 11/13/16 0058  HGB 8.2* 7.6*  --  10.2* 8.6*  HCT 24.4* 23.0*  --  30.3* 24.9*  PLT 373 391  --   --  336  APTT 93*  --  49* 106* 158*  LABPROT  --   --  30.6*  --   --   INR  --   --  2.86  --   --   HEPARINUNFRC 0.93*  --  >3.60*  --  >3.60*  CREATININE 0.94  --   --   --  3.01*    Estimated Creatinine Clearance: 12.2 mL/min (A) (by C-G formula based on SCr of 3.01 mg/dL (H)).   Medical History: Past Medical History:  Diagnosis Date  . Anemia   . Anxiety   . Arthritis   . Cancer [facility])    GIST  . Depression   . GERD (gastroesophageal reflux disease)   . GIST (gastrointestinal stroma tumor), malignant, colon ([place])   . History of hiatal hernia   . History of kidney stones   . Hypertension    NO MEDS NOW  . IBS (irritable bowel syndrome)   . Tremors of nervous system    HEAD    Assessment: 76 y/o F admitted with DVT and started on apixaban with last  dose of 10 mg 8/19. Hematuria reported per RN. Plan is to transition to heparin drip with possible IVC filter pending. 8-hour APTT level back at 78, which is within range.   Goal of Therapy:  APTT 68-109 Heparin level 0.3-0.7 units/ml Monitor platelets by anticoagulation protocol: Yes   Plan:  Will continue heparin infusion at 900 units/hr  Check aPTT level in 8 hours and daily while on heparin Continue to monitor H&H and platelets.  [place] Resident  11/13/16 4:25 AM     8/21 2300 aPTT 99. Continue current regimen. Recheck aPTT, heparin level and CBC with tomorrow AM labs.  Sim Boast, PharmD, BCPS  11/13/16 4:25 AM   8/22 AM aPTT 93, heparin level 0.93. Continue current regimen. Recheck aPTT, heparin level, and CBC with tomorrow AM labs.  8/23 0741 pharmacy consulted to discontinue Eliquis and re-initiate heparin. Patient had accidental fall in bathroom last night. Still has hematuria. She is not a candidate for IVCF d/t underlying malignancy and location. Baseline HL/aPTT ordered. Last Eliquis dose was 11/11/16 at 2145. We will resume heparin infusion at last known therapeutic rate 900 units/hr with no initial bolus. Will monitor using aPTT  as above due to concurrent apixaban.  8/23: APTT resulted @ 106. Will continue current heparin gtt rate. Will recheck APTT @ 0100 along with HL.   8/24 0100 aPTT 158, heparin level >3.6. Hold drip x 30 minutes and restart at 800 units/hr. Recheck aPTT 6 hours after restart.  Sim Boast, PharmD, BCPS  11/13/16 4:27 AM

## 2016-11-13 NOTE — Progress Notes (Signed)
Dr. Marcille Blanco notified of elevated labs; acknowledged; new orders written. Barbaraann Faster, RN 4:19 AM 11/13/2016

## 2016-11-13 NOTE — Progress Notes (Addendum)
[place] [facility] [place] NAME: Courtney [name]    MR#:  [id number]  DATE OF BIRTH:  1941-01-01  SUBJECTIVE:   The patient feels very weak,  still has hematuria. She looks pale, hemoglobin decreased to 7.3, creatinine is up to 3.0. CAT scan showed bladder hematoma. Dr. Rayburn Ma drawled a lot of blood from bladder and started CBI. REVIEW OF SYSTEMS:    Review of Systems   Review of Systems  Constitutional: Negative for fever, chills weight loss, generalized weakness. HENT: Negative for ear pain, nosebleeds, congestion, facial swelling, rhinorrhea, neck pain, neck stiffness and ear discharge.   Respiratory: Negative for cough, shortness of breath, wheezing  Cardiovascular: Negative for chest pain, palpitations and leg swelling.  Gastrointestinal: Negative for heartburn, abdominal pain, vomiting, diarrhea or consitpation Genitourinary: Negative for dysuria, urgency, frequency, ++ hematuria  Musculoskeletal: Negative for back pain or joint pain Neurological: Negative for dizziness, seizures, syncope, focal weakness,  numbness and headaches.  Hematological: Does not bruise/bleed easily.  Psychiatric/Behavioral: Negative for hallucinations, confusion, dysphoric mood    DRUG ALLERGIES:   Allergies  Allergen Reactions  . Other Swelling    Magic mouthwash  . Penicillins Swelling    Has patient had a PCN reaction causing immediate rash, facial/tongue/throat swelling, SOB or lightheadedness with hypotension: No Has patient had a PCN reaction causing severe rash involving mucus membranes or skin necrosis: No Has patient had a PCN reaction that required hospitalization: No Has patient had a PCN reaction occurring within the last 10 years: No If all of the above answers are "NO", then may proceed with Cephalosporin use.    . Ciprofloxacin Diarrhea    GI distress, abd cramping    VITALS:  Blood pressure (!) 155/72, pulse (!) 118, temperature 98.1 F (36.7  C), temperature source Oral, resp. rate 18, height 4\' 11"  (1.499 m), weight 129 lb (58.5 kg), SpO2 98 %.  PHYSICAL EXAMINATION:  Constitutional: Appears well-developed and well-nourished. No distress. looks pale. HENT: Normocephalic. Marland Kitchen Oropharynx is clear and moist.  Eyes: Conjunctivae and EOM are normal. PERRLA, no scleral icterus. Pale conjunctiva. Neck: Normal ROM. Neck supple. No JVD. No tracheal deviation. CVS: RRR, S1/S2 +, no murmurs, no gallops, no carotid bruit.  Pulmonary: Effort and breath sounds normal, no stridor, rhonchi, wheezes, rales.  Abdominal: Soft. BS +,  no distension, tenderness, rebound or guarding.  Musculoskeletal: Normal range of motion. No edema and no tenderness.  Neuro: Alert Oriented to name, place and time CN 2-12 grossly intact. No focal deficits. Skin: Skin is warm and dry. No rash noted. Psychiatric: Normal mood and affect.   LABORATORY PANEL:   CBC  Recent Labs Lab 11/13/16 1228  WBC 18.4*  HGB 7.3*  HCT 21.2*  PLT 319   ------------------------------------------------------------------------------------------------------------------  Chemistries   Recent Labs Lab 11/07/16 1023  11/13/16 1228  NA 135  < > 134*  K 3.1*  < > 4.2  CL 101  < > 102  CO2 24  < > 20*  GLUCOSE 100*  < > 128*  BUN 15  < > 66*  CREATININE 1.07*  < > 2.93*  CALCIUM 8.4*  < > 8.4*  MG 1.3*  --   --   AST 27  --   --   ALT 27  --   --   ALKPHOS 49  --   --   BILITOT 0.8  --   --   < > = values in this interval not  displayed. ------------------------------------------------------------------------------------------------------------------  Cardiac Enzymes No results for input(s): TROPONINI in the last 168 hours. ------------------------------------------------------------------------------------------------------------------  RADIOLOGY:  Ct Abdomen Pelvis Wo Contrast  Result Date: 11/13/2016 CLINICAL DATA:  Hematuria. Falling hemoglobin. Ureteral stents  in place. EXAM: CT ABDOMEN AND PELVIS WITHOUT CONTRAST TECHNIQUE: Multidetector CT imaging of the abdomen and pelvis was performed following the standard protocol without IV contrast. COMPARISON:  CT 11/07/2016 FINDINGS: Lower chest: Nodule in the RIGHT lower lobe measures 9 mm (image 6, series 4). This was not imaged on comparison exam. Hepatobiliary: No focal hepatic lesion on noncontrast exam. Postcholecystectomy Pancreas: Pancreas is normal. No ductal dilatation. No pancreatic inflammation. Spleen: Normal spleen Adrenals/urinary tract: Adrenal glands are normal. Bilateral ureteral stents in place. Mild hydronephrosis similar to comparison exam. Ureteral stents terminate in the bladder. There is high-density material filling the bladder. The bladder is mildly distended. Approximately 60% bladder is filled with the high density material presumed hemorrhage. Some thickening along the posterior wall again noted. Stomach/Bowel: Moderate hiatal hernia. Small bowel anastomosis in the mid abdomen. No high-grade obstruction. Contrast transits into the ascending colon. Appendix normal. Transverse and descending colon normal. Rectum appears normal. Vascular/Lymphatic: Periaortic retroperitoneal adenopathy again noted with masslike adenopathy at the level of the renal veins. Low-density external iliac adenopathy again noted greater on the LEFT measuring up to 2.5 cm short axis. LEFT inguinal adenopathy also again noted. Reproductive: Post hysterectomy Other: Peritoneal implant in the LEFT upper quadrant measuring 1.5 cm (image 21, series 3) again noted. Small implant inferior to the lower pole of LEFT kidney measuring 10 mm . Musculoskeletal: No aggressive osseous lesion. IMPRESSION: 1. New high-density material within the bladder consistent with bladder hematoma. Hematoma occupies approximate 60% of the bladder volume. Bladder mildly distended. 2. Bilateral ureteral stents in place.  Mild hydronephrosis. 3. Bulky  periaortic and iliac adenopathy. 4. Suspicion for peritoneal implants in the LEFT upper quadrant. These results will be called to the ordering clinician or representative by the Radiologist Assistant, and communication documented in the PACS or zVision Dashboard. Electronically Signed   By: Suzy Bouchard M.D.   On: 11/13/2016 10:56     ASSESSMENT AND PLAN:   [name] with history of progressive metastatic GIST dx 02/2014 and recent diagnosis of bladder tumor s/p TURBT and bilateral placement who presented with left leg pain and found to have DVT.   1. Left lower extremity DVT, nonocclusive in the common femoral vein and occlusive in the posterior tibial vein:due to underlying malignancy Due to anemia and possible need for Bilateral nephrostomy tubes, Eliquis was discontinuedand restarted heparin treatment. She can not have IVCF due to underlying cancer and location on IVC. Heparin drip is discontinued today due to bladder hematoma,hematuria and decreased hemoglobin.  2. Acute on chronic blood loss anemia with recent diagnosis of bladder tumor status post TURBT: Status post 1 unit PRBC 8/19 and 1 unit on 8/23. Hemoglobin dropped today and will need another unit of PRBC. Follow-up hemoglobin.  3.history of GISTT and bladder tumor:  Appreciate urology and oncology evaluation Diagnosis is still unknown. Pathology is pending. Patient has bilateral stents due to bilateral hydronephrosis.  Per Dr. Erlene Quan, bilateral nephrostomy tubes by interventional radiologist tomorrow.  4.hypokalemia: Resolved  5. Delirium:Resolved\  Acute renal failure. Possible due to obstruction secondary to bilateral hematoma. Hematoma was removed by Dr. Rayburn Ma Continue IV fluid support and follow-up BMP tomorrow.  Hyperkalemia. The patient was given 1 dose Kayexalate, improved.  Physical therapy was not recommending further PT follow-up on  11/10/2016  Management plans discussed with Dr. Erlene Quan, nurse, the  patient, her husband, son, brother, who are in agreement CODE STATUS: full  TOTAL TIME TAKING CARE OF THIS PATIENT: 42 minutes.     POSSIBLE D/C ?? DEPENDING ON CLINICAL CONDITION.   Demetrios Loll M.D on 11/13/2016 at 2:59 PM  Between 7am to 6pm - Pager - [phone] After 6pm go to [email or web address] - password [email or web address] [facility]  Office  [phone]  CC: Primary care physician; Rusty Aus, MD  Note: This dictation was prepared with Dragon dictation along with smaller phrase technology. Any transcriptional errors that result from this process are unintentional.

## 2016-11-13 NOTE — Care Management Important Message (Signed)
Important Message  Patient Details  Name: Courtney [name] MRN: [id number] Date of Birth: [birth year]   Medicare Important Message Given:  Yes    Shelbie Ammons, RN 11/13/2016, 8:08 AM

## 2016-11-13 NOTE — Progress Notes (Signed)
Urology Consult Follow Up  Subjective: On initial exam this morning, patient had dropped her hemoglobin significantly overnight, was complaining of lower abdominal pain, and creatinine elevated to 3.  Noncontrast CT scan was ordered by me which showed a large burden clot in the bladder. 8 [place] three-way hematuria catheter was then placed and she was hand irrigated with several liters of cleared over the course of ~30 min.  CBI was initiated.  Heparin drip was held.   Anti-infectives: Anti-infectives    None      Current Facility-Administered Medications  Medication Dose Route Frequency Provider Last Rate Last Dose  . 0.9 %  sodium chloride infusion   Intravenous Continuous Harrie Foreman, MD 100 mL/hr at 11/13/16 1349    . 0.9 %  sodium chloride infusion   Intravenous Once Demetrios Loll, MD   Stopped at 11/13/16 1556  . acetaminophen (TYLENOL) tablet 650 mg  650 mg Oral Q6H PRN Loletha Grayer, MD   650 mg at 11/10/16 1042   Or  . acetaminophen (TYLENOL) suppository 650 mg  650 mg Rectal Q6H PRN Loletha Grayer, MD      . acetaminophen (TYLENOL) tablet 650 mg  650 mg Oral Once Demetrios Loll, MD   Stopped at 11/13/16 1556  . bisacodyl (DULCOLAX) suppository 10 mg  10 mg Rectal Daily Bettey Costa, MD   Stopped at 11/13/16 1000  . cholecalciferol (VITAMIN D) tablet 1,000 Units  1,000 Units Oral Daily Loletha Grayer, MD   1,000 Units at 11/12/16 1118  . cyanocobalamin tablet 1,000 mcg  1,000 mcg Oral Daily Loletha Grayer, MD   1,000 mcg at 11/12/16 1118  . docusate sodium (COLACE) capsule 100 mg  100 mg Oral BID Bettey Costa, MD   Stopped at 11/13/16 1000  . ferrous sulfate tablet 325 mg  325 mg Oral TID WC Loletha Grayer, MD   325 mg at 11/13/16 1248  . hydrALAZINE (APRESOLINE) injection 10 mg  10 mg Intravenous Q4H PRN Saundra Shelling, MD   10 mg at 11/10/16 0112  . morphine 2 MG/ML injection 1 mg  1 mg Intravenous Q4H PRN Hugelmeyer, Alexis, DO   1 mg at 11/08/16 2007  . morphine 2  MG/ML injection 2 mg  2 mg Intravenous Once Nena Polio, MD      . multivitamin with minerals tablet 1 tablet  1 tablet Oral Daily Loletha Grayer, MD   1 tablet at 11/11/16 1001  . multivitamin-lutein (OCUVITE-LUTEIN) capsule 2 capsule  2 capsule Oral Daily Loletha Grayer, MD   2 capsule at 11/12/16 1120  . ondansetron (ZOFRAN) injection 4 mg  4 mg Intravenous Q8H PRN Hugelmeyer, Alexis, DO   4 mg at 11/13/16 0906  . opium-belladonna (B&O SUPPRETTES) 16.2-60 MG suppository 1 suppository  1 suppository Rectal Q8H PRN Hollice Espy, MD      . oxyCODONE (Oxy IR/ROXICODONE) immediate release tablet 5 mg  5 mg Oral Q6H PRN Loletha Grayer, MD   5 mg at 11/13/16 1551  . phenazopyridine (PYRIDIUM) tablet 100 mg  100 mg Oral TID PRN Hollice Espy, MD   100 mg at 11/11/16 1746  . polyethylene glycol (MIRALAX / GLYCOLAX) packet 17 g  17 g Oral Daily Bettey Costa, MD   Stopped at 11/13/16 1000  . risperiDONE (RISPERDAL) tablet 0.25 mg  0.25 mg Oral BID PRN Bettey Costa, MD   0.25 mg at 11/09/16 0936     Objective: Vital signs in last 24 hours: Temp:  [98.1 F (36.7 C)-98.3 F (  36.8 C)] 98.1 F (36.7 C) (08/24 1120) Pulse Rate:  [115-119] 118 (08/24 1120) Resp:  [18-24] 18 (08/24 1120) BP: (148-158)/(72-74) 155/72 (08/24 1120) SpO2:  [98 %-99 %] 98 % (08/24 1120)  Intake/Output from previous day: 08/23 0701 - 08/24 0700 In: 784 [I.V.:390; Blood:394] Out: 300 [Urine:300] Intake/Output this shift: Total I/O In: 8240 [P.O.:240; I.V.:1000; Other:7000] Out: 5750 [Urine:5750]   Physical Exam Constitutional: Well nourished. Alert and oriented, No acute distress. HEENT: [place] AT, moist mucus membranes. Trachea midline, no masses. No hematoma on head.   Cardiovascular: No clubbing, cyanosis, or edema. Respiratory: Normal respiratory effort, no increased work of breathing. GI: Abdomen is soft, non tender, non distended, no abdominal masses. GU: No CVA tenderness.  Suprapubic  fullness. Skin: No rashes, bruises or suspicious lesions. Neurologic: Grossly intact, no focal deficits, moving all 4 extremities. Psychiatric: Normal mood and affect.  Lab Results:   Recent Labs  11/13/16 0058 11/13/16 1228  WBC 20.4* 18.4*  HGB 8.6* 7.3*  HCT 24.9* 21.2*  PLT 336 319   BMET  Recent Labs  11/13/16 0058 11/13/16 1228  NA 131* 134*  K 5.3* 4.2  CL 101 102  CO2 19* 20*  GLUCOSE 135* 128*  BUN 61* 66*  CREATININE 3.01* 2.93*  CALCIUM 9.0 8.4*   PT/INR  Recent Labs  11/12/16 0819  LABPROT 30.6*  INR 2.86    Studies/Results: Ct Abdomen Pelvis Wo Contrast  Result Date: 11/13/2016 CLINICAL DATA:  Hematuria. Falling hemoglobin. Ureteral stents in place. EXAM: CT ABDOMEN AND PELVIS WITHOUT CONTRAST TECHNIQUE: Multidetector CT imaging of the abdomen and pelvis was performed following the standard protocol without IV contrast. COMPARISON:  CT 11/07/2016 FINDINGS: Lower chest: Nodule in the RIGHT lower lobe measures 9 mm (image 6, series 4). This was not imaged on comparison exam. Hepatobiliary: No focal hepatic lesion on noncontrast exam. Postcholecystectomy Pancreas: Pancreas is normal. No ductal dilatation. No pancreatic inflammation. Spleen: Normal spleen Adrenals/urinary tract: Adrenal glands are normal. Bilateral ureteral stents in place. Mild hydronephrosis similar to comparison exam. Ureteral stents terminate in the bladder. There is high-density material filling the bladder. The bladder is mildly distended. Approximately 60% bladder is filled with the high density material presumed hemorrhage. Some thickening along the posterior wall again noted. Stomach/Bowel: Moderate hiatal hernia. Small bowel anastomosis in the mid abdomen. No high-grade obstruction. Contrast transits into the ascending colon. Appendix normal. Transverse and descending colon normal. Rectum appears normal. Vascular/Lymphatic: Periaortic retroperitoneal adenopathy again noted with masslike  adenopathy at the level of the renal veins. Low-density external iliac adenopathy again noted greater on the LEFT measuring up to 2.5 cm short axis. LEFT inguinal adenopathy also again noted. Reproductive: Post hysterectomy Other: Peritoneal implant in the LEFT upper quadrant measuring 1.5 cm (image 21, series 3) again noted. Small implant inferior to the lower pole of LEFT kidney measuring 10 mm . Musculoskeletal: No aggressive osseous lesion. IMPRESSION: 1. New high-density material within the bladder consistent with bladder hematoma. Hematoma occupies approximate 60% of the bladder volume. Bladder mildly distended. 2. Bilateral ureteral stents in place.  Mild hydronephrosis. 3. Bulky periaortic and iliac adenopathy. 4. Suspicion for peritoneal implants in the LEFT upper quadrant. These results will be called to the ordering clinician or representative by the Radiologist Assistant, and communication documented in the PACS or zVision Dashboard. Electronically Signed   By: Suzy Bouchard M.D.   On: 11/13/2016 10:56   CT scan personally reviewed.  Assessment and Plan 1. Pelvic / bladder mass- High-grade malignant  neoplasm, second opinion being sought for and [facility].    2. 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 Transitioned from heparin to Eliquis, now being held due to ongoing hematuria. Recommend holding heparin until bilateral percutaneous nephrostomy tubes placed.  3. Hematuria- anticipated in the setting of bilateral ureteral stents, recent TUR procedure, and anticoagulation. Fairly significant drop in hemoglobin over the past 24 hours. Large burden clot in bladder which was hand irrigated until nearly clear. Follow-up bladder ultrasound ordered to assess residual burden.  Case discussed with Dr. Anselm Pancoast, on-call interventional radiologist for the weekend who is agreed to place bilateral percutaneous nephrostomy tubes for urinary diversion. We'll plan  for subsequent removal of double-J stents thereafter.  4. Bilateral hydro- s/p stent, having stent discomfort -  resolved with Cr back to baseline until this morning which is elevated likely due to clot retention.  Urology will continue to follow. Disorder understands goals of treatment and landed intervention. Both her husband, brother, and son were all personally updated. Case was also discussed with hospitalist, Dr. Bridgett Larsson.  I spent greater than an hour today discussing the case with all of the aforementioned providers as well communicating with her family, and providing care face-to-face.    LOS: 6 days    Hollice Espy 11/13/2016

## 2016-11-13 NOTE — Progress Notes (Signed)
Advanced Care Plan.  Purpose of Encounter:  CODE STATUS Parties in Attendance: the patient, her husband, son, brother, and me. Patient's Decisional Capacity: Yes. 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 She can not have IVCF due to underlying cancer and location on IVC. She developed Acute on chronic blood loss anemia with recent diagnosis of bladder tumor status post TURBT and heparin drip. Her hemoglobin decreased to 7.3, creatinine increased to 3.0. I discussed about patient's condition and poor prognosis. The patient wants treatment and full code.  Plan:  Code Status: full code Time spent discussing advance care planning. 18 minutes.

## 2016-11-13 NOTE — Progress Notes (Signed)
Will initiate a prescription for regorafenib; to start when acute issues resolve.

## 2016-11-13 NOTE — Progress Notes (Addendum)
  Dr. Erlene Quan called me with the ultrasound results.   Courtney [name] still has a large volume of clot in the bladder and it was felt she will need clot evacuation with stent removal.    She is to be scheduled for bilateral perc tubes tomorrow so she is already NPO and has had the heparin held.  I will try to get her scheduled for in the morning and hopefully I can coordinate the procedure without interfering with the IR procedure.     I will message Dr. Anselm Pancoast.

## 2016-11-13 NOTE — Progress Notes (Signed)
Courtney [name]   DOB:03/08/1941   ZO#:[id number]    Subjective: Events noted since morning. Patient noted to have worsening abdominal pain /increasing bleeding in urine. Patient noted to have drop in hemoglobin 7.3;  acute renal failure creatinine up to 3; CT scan of the abdomen and pelvis showed hematoma in the bladder. Patient had Foley catheter placed; currently on continuous bladder irrigation.   Review of system: She feels extremely weak. Denies any chest pain or shortness of breath.  Objective:  Vitals:   11/13/16 0520 11/13/16 1120  BP: (!) 158/73 (!) 155/72  Pulse: (!) 119 (!) 118  Resp: 18 18  Temp: 98.3 F (36.8 C) 98.1 F (36.7 C)  SpO2: 99% 98%     Intake/Output Summary (Last 24 hours) at 11/13/16 1749 Last data filed at 11/13/16 1600  Gross per 24 hour  Intake             8574 ml  Output             6050 ml  Net             2524 ml    GENERAL: Well-nourished well-developed; Thin built.. Alert, no distress and comfortable.   Alone.  EYES: Positive for pallor. OROPHARYNX: no thrush or ulceration. NECK: supple, no masses felt LYMPH:  no palpable lymphadenopathy in the cervical, axillary or inguinal regions LUNGS: decreased breath sounds to auscultation at bases and  No wheeze or crackles HEART/CVS: regular rate & rhythm and no murmurs; swelling/ pain of Left LE.  ABDOMEN: abdomen soft, non-tender and normal bowel sounds; Positive for Foley catheter draining blood-tinged urine. Musculoskeletal:no cyanosis of digits and no clubbing  PSYCH: alert & oriented x 3  NEURO: no focal motor/sensory deficits SKIN:  no rashes or significant lesions   Labs:  Lab Results  Component Value Date   WBC 18.4 (H) 11/13/2016   HGB 7.3 (L) 11/13/2016   HCT 21.2 (L) 11/13/2016   MCV 95.4 11/13/2016   PLT 319 11/13/2016   NEUTROABS 6.3 11/07/2016    Lab Results  Component Value Date   NA 134 (L) 11/13/2016   K 4.2 11/13/2016   CL 102 11/13/2016   CO2 20 (L) 11/13/2016     Studies:  Ct Abdomen Pelvis Wo Contrast  Result Date: 11/13/2016 CLINICAL DATA:  Hematuria. Falling hemoglobin. Ureteral stents in place. EXAM: CT ABDOMEN AND PELVIS WITHOUT CONTRAST TECHNIQUE: Multidetector CT imaging of the abdomen and pelvis was performed following the standard protocol without IV contrast. COMPARISON:  CT 11/07/2016 FINDINGS: Lower chest: Nodule in the RIGHT lower lobe measures 9 mm (image 6, series 4). This was not imaged on comparison exam. Hepatobiliary: No focal hepatic lesion on noncontrast exam. Postcholecystectomy Pancreas: Pancreas is normal. No ductal dilatation. No pancreatic inflammation. Spleen: Normal spleen Adrenals/urinary tract: Adrenal glands are normal. Bilateral ureteral stents in place. Mild hydronephrosis similar to comparison exam. Ureteral stents terminate in the bladder. There is high-density material filling the bladder. The bladder is mildly distended. Approximately 60% bladder is filled with the high density material presumed hemorrhage. Some thickening along the posterior wall again noted. Stomach/Bowel: Moderate hiatal hernia. Small bowel anastomosis in the mid abdomen. No high-grade obstruction. Contrast transits into the ascending colon. Appendix normal. Transverse and descending colon normal. Rectum appears normal. Vascular/Lymphatic: Periaortic retroperitoneal adenopathy again noted with masslike adenopathy at the level of the renal veins. Low-density external iliac adenopathy again noted greater on the LEFT measuring up to 2.5 cm short axis. LEFT inguinal  adenopathy also again noted. Reproductive: Post hysterectomy Other: Peritoneal implant in the LEFT upper quadrant measuring 1.5 cm (image 21, series 3) again noted. Small implant inferior to the lower pole of LEFT kidney measuring 10 mm . Musculoskeletal: No aggressive osseous lesion. IMPRESSION: 1. New high-density material within the bladder consistent with bladder hematoma. Hematoma occupies  approximate 60% of the bladder volume. Bladder mildly distended. 2. Bilateral ureteral stents in place.  Mild hydronephrosis. 3. Bulky periaortic and iliac adenopathy. 4. Suspicion for peritoneal implants in the LEFT upper quadrant. These results will be called to the ordering clinician or representative by the Radiologist Assistant, and communication documented in the PACS or zVision Dashboard. Electronically Signed   By: Suzy Bouchard M.D.   On: 11/13/2016 10:56   US Pelvis Limited  Result Date: 11/13/2016 CLINICAL DATA:  Hematuria. EXAM: LIMITED ULTRASOUND OF PELVIS TECHNIQUE: Limited transabdominal ultrasound examination of the pelvis was performed. COMPARISON:  GE CT from 11/13/2016 FINDINGS: The urinary bladder is distended by a large complicated heterogeneous mass corresponding to the high density material identified on CT from today. Other findings:  None. IMPRESSION: 1. Large complex heterogeneous mass within the urinary bladder compatible with bladder hematoma. Electronically Signed   By: Kerby Moors M.D.   On: 11/13/2016 16:47    Assessment & Plan:   # 76 year old female patient with history of Gist tumor abdomen/pelvis-currently in the hospital for left lower extremity DVT  # Left lower extremity DVT- bleeding noted on anticoagulation with heparin. Currently on hold. We will resume once hematuria/bleeding improves. Not a candidate for IVC filter secondary to retroperitoneal adenopathy.  # Hematuria- status post recent stenting for bilateral hydronephrosis; TURBT; worsened while on anticoagulation. Anticoagulation on hold. Awaiting percutaneous nephrostomy tubes tomorrow.  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 Preliminary pathology on TURBT/cystoscopy positive for "high-grade malignancy-morphologically appears different from her previous Gist tumor". However, I clinically suspect this is recurrence of her previous Gist tumor.  Discussed with  Dr.Onley/reviewed at tumor conference on 08/23. Awaiting pathology review at [facility]. We'll initiate prescription for Regorafenib to be started when acute issues resolve.   # Severe anemia secondary to hematuria/anticoagulation- hemoglobin 7.3; status post transfusion today; awaiting on repeat hemoglobin.   Dr.Corcoran to follow over the week end.   Cammie Sickle, MD 11/13/2016  5:49 PM

## 2016-11-14 ENCOUNTER — Inpatient Hospital Stay: Payer: PPO | Admitting: Anesthesiology

## 2016-11-14 ENCOUNTER — Encounter: Payer: Self-pay | Admitting: Diagnostic Radiology

## 2016-11-14 ENCOUNTER — Inpatient Hospital Stay: Payer: PPO

## 2016-11-14 ENCOUNTER — Encounter
Admission: EM | Disposition: A | Discharge status: Home Health | Payer: Self-pay | Source: Home / Self Care | Attending: Internal Medicine

## 2016-11-14 DIAGNOSIS — D494 Neoplasm of unspecified behavior of bladder: Secondary | ICD-10-CM | POA: Diagnosis not present

## 2016-11-14 DIAGNOSIS — N3289 Other specified disorders of bladder: Secondary | ICD-10-CM | POA: Diagnosis not present

## 2016-11-14 HISTORY — PX: IR NEPHROSTOMY PLACEMENT RIGHT: IMG6064

## 2016-11-14 HISTORY — PX: IR NEPHROSTOMY PLACEMENT LEFT: IMG6063

## 2016-11-14 HISTORY — PX: CYSTOSCOPY WITH RETROGRADE PYELOGRAM, URETEROSCOPY AND STENT PLACEMENT: SHX5789

## 2016-11-14 LAB — CBC
HCT: 25.7 % — ABNORMAL LOW (ref 35.0–47.0)
HEMOGLOBIN: 9 g/dL — AB (ref 12.0–16.0)
MCH: 33.5 pg (ref 26.0–34.0)
MCHC: 35 g/dL (ref 32.0–36.0)
MCV: 95.6 fL (ref 80.0–100.0)
PLATELETS: 245 10*3/uL (ref 150–440)
RBC: 2.69 MIL/uL — ABNORMAL LOW (ref 3.80–5.20)
RDW: 17.4 % — AB (ref 11.5–14.5)
WBC: 15.5 10*3/uL — ABNORMAL HIGH (ref 3.6–11.0)

## 2016-11-14 LAB — BASIC METABOLIC PANEL
Anion gap: 9 (ref 5–15)
BUN: 58 mg/dL — ABNORMAL HIGH (ref 6–20)
CALCIUM: 7.7 mg/dL — AB (ref 8.9–10.3)
CO2: 21 mmol/L — ABNORMAL LOW (ref 22–32)
CREATININE: 1.9 mg/dL — AB (ref 0.44–1.00)
Chloride: 106 mmol/L (ref 101–111)
GFR, EST AFRICAN AMERICAN: 28 mL/min — AB (ref >60)
GFR, EST NON AFRICAN AMERICAN: 24 mL/min — AB (ref >60)
Glucose, Bld: 102 mg/dL — ABNORMAL HIGH (ref 65–99)
Potassium: 2.9 mmol/L — ABNORMAL LOW (ref 3.5–5.1)
SODIUM: 136 mmol/L (ref 135–145)

## 2016-11-14 LAB — PROTIME-INR
INR: 1.25
PROTHROMBIN TIME: 15.8 s — AB (ref 11.4–15.2)

## 2016-11-14 LAB — MAGNESIUM: Magnesium: 1.6 mg/dL — ABNORMAL LOW (ref 1.7–2.4)

## 2016-11-14 LAB — APTT: aPTT: 31 seconds (ref 24–36)

## 2016-11-14 SURGERY — CYSTOURETEROSCOPY, WITH RETROGRADE PYELOGRAM AND STENT INSERTION
Anesthesia: General | Laterality: Bilateral | Wound class: Clean Contaminated

## 2016-11-14 MED ORDER — SUCCINYLCHOLINE CHLORIDE 20 MG/ML IJ SOLN
INTRAMUSCULAR | Status: AC
  Filled 2016-11-14: qty 1

## 2016-11-14 MED ORDER — FENTANYL CITRATE (PF) 100 MCG/2ML IJ SOLN
INTRAMUSCULAR | Status: DC | PRN
  Administered 2016-11-14: 25 ug via INTRAVENOUS
  Administered 2016-11-14: 50 ug via INTRAVENOUS

## 2016-11-14 MED ORDER — MIDAZOLAM HCL 5 MG/5ML IJ SOLN
INTRAMUSCULAR | Status: AC | PRN
  Administered 2016-11-14 (×3): 1 mg via INTRAVENOUS

## 2016-11-14 MED ORDER — LIDOCAINE HCL (PF) 2 % IJ SOLN
INTRAMUSCULAR | Status: AC
  Filled 2016-11-14: qty 2

## 2016-11-14 MED ORDER — PROPOFOL 10 MG/ML IV BOLUS
INTRAVENOUS | Status: DC | PRN
  Administered 2016-11-14: 30 mg via INTRAVENOUS
  Administered 2016-11-14: 70 mg via INTRAVENOUS

## 2016-11-14 MED ORDER — IOPAMIDOL (ISOVUE-300) INJECTION 61%
30.0000 mL | Freq: Once | INTRAVENOUS | Status: AC | PRN
  Administered 2016-11-14: 15 mL via INTRAVENOUS

## 2016-11-14 MED ORDER — FENTANYL CITRATE (PF) 100 MCG/2ML IJ SOLN: 25.0000 ug | INTRAMUSCULAR | Status: DC | PRN

## 2016-11-14 MED ORDER — FENTANYL CITRATE (PF) 100 MCG/2ML IJ SOLN
INTRAMUSCULAR | Status: AC | PRN
  Administered 2016-11-14 (×3): 25 ug via INTRAVENOUS

## 2016-11-14 MED ORDER — ONDANSETRON HCL 4 MG/2ML IJ SOLN: 4.0000 mg | Freq: Once | INTRAMUSCULAR | Status: DC | PRN

## 2016-11-14 MED ORDER — ONDANSETRON HCL 4 MG/2ML IJ SOLN
INTRAMUSCULAR | Status: AC
Start: 2016-11-14 — End: 2016-11-14
  Filled 2016-11-14: qty 2

## 2016-11-14 MED ORDER — ALUM & MAG HYDROXIDE-SIMETH 200-200-20 MG/5ML PO SUSP: 30.0000 mL | ORAL | Status: DC | PRN

## 2016-11-14 MED ORDER — PHENYLEPHRINE HCL 10 MG/ML IJ SOLN
INTRAMUSCULAR | Status: DC | PRN
  Administered 2016-11-14: 100 ug via INTRAVENOUS
  Administered 2016-11-14 (×2): 200 ug via INTRAVENOUS

## 2016-11-14 MED ORDER — ONDANSETRON HCL 4 MG/2ML IJ SOLN
INTRAMUSCULAR | Status: DC | PRN
Start: 2016-11-14 — End: 2016-11-14
  Administered 2016-11-14: 4 mg via INTRAVENOUS

## 2016-11-14 MED ORDER — SUCCINYLCHOLINE CHLORIDE 20 MG/ML IJ SOLN
INTRAMUSCULAR | Status: DC | PRN
  Administered 2016-11-14: 60 mg via INTRAVENOUS

## 2016-11-14 MED ORDER — MAGNESIUM SULFATE 2 GM/50ML IV SOLN
2.0000 g | Freq: Once | INTRAVENOUS | Status: AC
  Administered 2016-11-14: 2 g via INTRAVENOUS
  Filled 2016-11-14: qty 50

## 2016-11-14 MED ORDER — CLINDAMYCIN PHOSPHATE 300 MG/50ML IV SOLN
300.0000 mg | Freq: Once | INTRAVENOUS | Status: AC
  Administered 2016-11-14: 300 mg via INTRAVENOUS

## 2016-11-14 MED ORDER — LACTATED RINGERS IV SOLN
INTRAVENOUS | Status: DC | PRN
  Administered 2016-11-14: 12:00:00 via INTRAVENOUS

## 2016-11-14 MED ORDER — DEXAMETHASONE SODIUM PHOSPHATE 10 MG/ML IJ SOLN
INTRAMUSCULAR | Status: DC | PRN
  Administered 2016-11-14: 10 mg via INTRAVENOUS

## 2016-11-14 MED ORDER — PROPOFOL 10 MG/ML IV BOLUS
INTRAVENOUS | Status: AC
  Filled 2016-11-14: qty 20

## 2016-11-14 MED ORDER — FENTANYL CITRATE (PF) 100 MCG/2ML IJ SOLN
INTRAMUSCULAR | Status: AC
  Filled 2016-11-14: qty 2

## 2016-11-14 MED ORDER — CLINDAMYCIN PHOSPHATE 300 MG/50ML IV SOLN: 300.0000 mg | INTRAVENOUS | Status: DC

## 2016-11-14 MED ORDER — POTASSIUM CHLORIDE CRYS ER 20 MEQ PO TBCR
60.0000 meq | EXTENDED_RELEASE_TABLET | Freq: Once | ORAL | Status: AC
  Administered 2016-11-16: 60 meq via ORAL
  Filled 2016-11-14: qty 3

## 2016-11-14 MED ORDER — LIDOCAINE HCL (CARDIAC) 20 MG/ML IV SOLN
INTRAVENOUS | Status: DC | PRN
  Administered 2016-11-14: 80 mg via INTRAVENOUS

## 2016-11-14 MED ORDER — DEXAMETHASONE SODIUM PHOSPHATE 10 MG/ML IJ SOLN
INTRAMUSCULAR | Status: AC
Start: 2016-11-14 — End: 2016-11-14
  Filled 2016-11-14: qty 1

## 2016-11-14 SURGICAL SUPPLY — 16 items
BAG DRAIN CYSTO-URO LG1000N (MISCELLANEOUS) ×3 IMPLANT
BAG URO DRAIN 4000ML (MISCELLANEOUS) ×3 IMPLANT
CATH FOLEY 3WAY 30CC 22FR (CATHETERS) ×3 IMPLANT
DRAPE XRAY CASSETTE 23X24 (DRAPES) ×3 IMPLANT
GLOVE BIO SURGEON STRL SZ8 (GLOVE) ×3 IMPLANT
GOWN STRL REUS W/ TWL LRG LVL3 (GOWN DISPOSABLE) ×1 IMPLANT
GOWN STRL REUS W/TWL LRG LVL3 (GOWN DISPOSABLE) ×2
KIT RM TURNOVER CYSTO AR (KITS) ×3 IMPLANT
LOOP CUT BIPOLAR 24F LRG (ELECTROSURGICAL) ×3 IMPLANT
PACK CYSTO AR (MISCELLANEOUS) ×3 IMPLANT
PREP PVP WINGED SPONGE (MISCELLANEOUS) ×3 IMPLANT
SET CYSTO W/LG BORE CLAMP LF (SET/KITS/TRAYS/PACK) ×3 IMPLANT
SOL .9 NS 3000ML IRR  AL (IV SOLUTION) ×12
SOL .9 NS 3000ML IRR UROMATIC (IV SOLUTION) ×6 IMPLANT
WATER STERILE IRR 1000ML POUR (IV SOLUTION) ×3 IMPLANT
WATER STERILE IRR 3000ML UROMA (IV SOLUTION) IMPLANT

## 2016-11-14 NOTE — Consult Note (Signed)
Chief Complaint: Patient was seen in consultation today for bilateral hydronephrosis and hematuria  Referring Physician(s): Hollice Espy, MD   Patient Status: [facility] - In-pt  History of Present Illness: Courtney [name] is a 76 y.o. female with history of GIST and pelvic/bladder mass.  Patient has bilateral ureter stents but she has experienced a large amount of bladder bleeding.  This has been complicated by a left lower DVT and anticoagulation therapy.  At this time, Urology would like to remove the ureter stents because it is likely contributing to bladder irritation and bleeding and place bilateral nephrostomy tubes.  Renal function is abnormal but fluctuating.  Patient's main complaint is left thigh pain and itching.    Past Medical History:  Diagnosis Date  . Anemia   . Anxiety   . Arthritis   . Cancer [facility])    GIST  . Depression   . GERD (gastroesophageal reflux disease)   . GIST (gastrointestinal stroma tumor), malignant, colon ([place])   . History of hiatal hernia   . History of kidney stones   . Hypertension    NO MEDS NOW  . IBS (irritable bowel syndrome)   . Tremors of nervous system    HEAD    Past Surgical History:  Procedure Laterality Date  . ABDOMINAL HYSTERECTOMY    . BREAST BIOPSY Right   . CATARACT EXTRACTION W/PHACO Right 01/28/2015   Procedure: CATARACT EXTRACTION PHACO AND INTRAOCULAR LENS PLACEMENT (IOC);  Surgeon: Estill Cotta, MD;  Location: [facility] ORS;  Service: Ophthalmology;  Laterality: Right;  [name] 00:59.3 AP% 22.6 CDE 24.69 fluid pack lot #[id number]  . CATARACT EXTRACTION W/PHACO Left 02/11/2015   Procedure: CATARACT EXTRACTION PHACO AND INTRAOCULAR LENS PLACEMENT (IOC);  Surgeon: Estill Cotta, MD;  Location: [facility] ORS;  Service: Ophthalmology;  Laterality: Left;  [name] 01:18 AP% 22.8 CDE 32.38  fluid pack lot # [id number]  . CHOLECYSTECTOMY    . COLON SURGERY     COLOSTOMY AND REVERSAL  . CYSTOSCOPY W/ RETROGRADES Bilateral 11/02/2016   Procedure: CYSTOSCOPY WITH RETROGRADE PYELOGRAM;  Surgeon: Hollice Espy, MD;  Location: [facility] ORS;  Service: Urology;  Laterality: Bilateral;  . CYSTOSCOPY WITH STENT PLACEMENT Bilateral 11/02/2016   Procedure: CYSTOSCOPY WITH STENT PLACEMENT;  Surgeon: Hollice Espy, MD;  Location: [facility] ORS;  Service: Urology;  Laterality: Bilateral;  . STOMACH SURGERY     CANCER  . TRANSURETHRAL RESECTION OF BLADDER TUMOR N/A 11/02/2016   Procedure: TRANSURETHRAL RESECTION OF BLADDER TUMOR (TURBT) >5CM;  Surgeon: Hollice Espy, MD;  Location: [facility] ORS;  Service: Urology;  Laterality: N/A;    Allergies: Other; Penicillins; and Ciprofloxacin  Medications: Prior to Admission medications   Medication Sig Start Date End Date Taking? Authorizing Provider  Cetirizine HCl (ZYRTEC PO) Take 1 tablet by mouth daily.    Yes [provider]  Cholecalciferol (VITAMIN D3 ADULT GUMMIES) 1000 units CHEW Chew 1 each by mouth daily.   Yes [provider]  Cyanocobalamin (RA VITAMIN B-12 TR) 1000 MCG TBCR Take 2 tablets by mouth daily.    Yes [provider]  Iron-Vitamin C (VITRON-C) 65-125 MG TABS Take 2 tablets by mouth daily.    Yes [provider]  megestrol (MEGACE) 40 MG tablet daily three days a week 05/13/15  Yes [provider]  Multiple Vitamin (MULTIVITAMIN) tablet Take 1 tablet by mouth daily.   Yes [provider]  Multiple Vitamins-Minerals (PRESERVISION AREDS PO) Take 2 tablets by mouth daily.   Yes [provider]  potassium chloride SA (K-DUR,KLOR-CON) 20 MEQ tablet Take 1 tablet (20 mEq total) by mouth daily. 05/03/15  Yes Choksi, Delorise Shiner, MD  gabapentin (NEURONTIN) 100 MG capsule Take 100 mg by mouth 3 (three) times daily.    [provider]  regorafenib (STIVARGA) 40 MG tablet Take 2 tablets (80 mg total) by mouth daily with breakfast. Take with low fat meal. Caution: Chemotherapy. 11/13/16   Cammie Sickle, MD     Family  History  Problem Relation Age of Onset  . Breast cancer Maternal Grandmother   . CAD Mother   . CAD Father     Social History   Social History  . Marital status: Married    Spouse name: N/A  . Number of children: N/A  . Years of education: N/A   Social History Main Topics  . Smoking status: Never Smoker  . Smokeless tobacco: Never Used  . Alcohol use No  . Drug use: No  . Sexual activity: Not Asked   Other Topics Concern  . None   Social History Narrative  . None   Review of Systems  Vital Signs: BP (!) 145/65 (BP Location: Left Arm)   Pulse 90   Temp 98.2 F (36.8 C) (Oral)   Resp 20   Ht 4\' 11"  (1.499 m)   Wt 129 lb (58.5 kg)   SpO2 99%   BMI 26.05 kg/m   Physical Exam  Cardiovascular: Normal rate and regular rhythm.   Murmur heard. Pulmonary/Chest: Effort normal and breath sounds normal.  Abdominal: Soft. Bowel sounds are normal. She exhibits no distension. There is no tenderness.    Mallampati Score:  MD Evaluation Airway: WNL Heart: Other (comments) Heart  comments: systolic murmur Abdomen: WNL Chest/ Lungs: WNL ASA  Classification: 3 Mallampati/Airway Score: One  Imaging: Ct Abdomen Pelvis Wo Contrast  Result Date: 11/13/2016 CLINICAL DATA:  Hematuria. Falling hemoglobin. Ureteral stents in place. EXAM: CT ABDOMEN AND PELVIS WITHOUT CONTRAST TECHNIQUE: Multidetector CT imaging of the abdomen and pelvis was performed following the standard protocol without IV contrast. COMPARISON:  CT 11/07/2016 FINDINGS: Lower chest: Nodule in the RIGHT lower lobe measures 9 mm (image 6, series 4). This was not imaged on comparison exam. Hepatobiliary: No focal hepatic lesion on noncontrast exam. Postcholecystectomy Pancreas: Pancreas is normal. No ductal dilatation. No pancreatic inflammation. Spleen: Normal spleen Adrenals/urinary tract: Adrenal glands are normal. Bilateral ureteral stents in place. Mild hydronephrosis similar to comparison exam. Ureteral stents  terminate in the bladder. There is high-density material filling the bladder. The bladder is mildly distended. Approximately 60% bladder is filled with the high density material presumed hemorrhage. Some thickening along the posterior wall again noted. Stomach/Bowel: Moderate hiatal hernia. Small bowel anastomosis in the mid abdomen. No high-grade obstruction. Contrast transits into the ascending colon. Appendix normal. Transverse and descending colon normal. Rectum appears normal. Vascular/Lymphatic: Periaortic retroperitoneal adenopathy again noted with masslike adenopathy at the level of the renal veins. Low-density external iliac adenopathy again noted greater on the LEFT measuring up to 2.5 cm short axis. LEFT inguinal adenopathy also again noted. Reproductive: Post hysterectomy Other: Peritoneal implant in the LEFT upper quadrant measuring 1.5 cm (image 21, series 3) again noted. Small implant inferior to the lower pole of LEFT kidney measuring 10 mm . Musculoskeletal: No aggressive osseous lesion. IMPRESSION: 1. New high-density material within the bladder consistent with bladder hematoma. Hematoma occupies approximate 60% of the bladder volume. Bladder mildly distended. 2. Bilateral ureteral stents in place.  Mild hydronephrosis. 3. Bulky  periaortic and iliac adenopathy. 4. Suspicion for peritoneal implants in the LEFT upper quadrant. These results will be called to the ordering clinician or representative by the Radiologist Assistant, and communication documented in the PACS or zVision Dashboard. Electronically Signed   By: Suzy Bouchard M.D.   On: 11/13/2016 10:56   Ct Head Wo Contrast  Result Date: 11/09/2016 CLINICAL DATA:  Altered mental status EXAM: CT HEAD WITHOUT CONTRAST TECHNIQUE: Contiguous axial images were obtained from the base of the skull through the vertex without intravenous contrast. COMPARISON:  02/16/2014 FINDINGS: Brain: No acute intracranial abnormality. Specifically, no  hemorrhage, hydrocephalus, mass lesion, acute infarction, or significant intracranial injury. There is atrophy and chronic small vessel disease changes. Vascular: No hyperdense vessel or unexpected calcification. Skull: No acute calvarial abnormality. Sinuses/Orbits: Visualized paranasal sinuses and mastoids clear. Orbital soft tissues unremarkable. Other: None IMPRESSION: No acute intracranial abnormality. Atrophy, chronic microvascular disease. Electronically Signed   By: Rolm Baptise M.D.   On: 11/09/2016 10:03   US Pelvis Limited  Result Date: 11/13/2016 CLINICAL DATA:  Hematuria. EXAM: LIMITED ULTRASOUND OF PELVIS TECHNIQUE: Limited transabdominal ultrasound examination of the pelvis was performed. COMPARISON:  GE CT from 11/13/2016 FINDINGS: The urinary bladder is distended by a large complicated heterogeneous mass corresponding to the high density material identified on CT from today. Other findings:  None. IMPRESSION: 1. Large complex heterogeneous mass within the urinary bladder compatible with bladder hematoma. Electronically Signed   By: Kerby Moors M.D.   On: 11/13/2016 16:47   Ct Abdomen Pelvis W Contrast  Result Date: 11/07/2016 CLINICAL DATA:  Pt states she's been having back and abdominal pain, pain increases with movement, pt states last Monday bladder stints placed, hx epidural, hernia repair, GB, GERD, hyst. EXAM: CT ABDOMEN AND PELVIS WITH CONTRAST TECHNIQUE: Multidetector CT imaging of the abdomen and pelvis was performed using the standard protocol following bolus administration of intravenous contrast. CONTRAST:  39mL ISOVUE-300 IOPAMIDOL (ISOVUE-300) INJECTION 61% COMPARISON:  06/01/2016 FINDINGS: Lower chest: 4 mm nodule, left lower lobe, image 4, series 4, new since the prior CT. Minor dependent subsegmental atelectasis. No acute findings. Hepatobiliary: No focal liver abnormality is seen. Status post cholecystectomy. No biliary dilatation. Pancreas: Unremarkable. No pancreatic  ductal dilatation or surrounding inflammatory changes. Spleen: Unremarkable. Adrenals/Urinary Tract: No adrenal masses. Bilateral ureteral stents appear well-positioned. Mild left and mild-to-moderate right renal collecting system dilation. Small low-density mass consistent with a cyst from the superior margin of the left kidney, measuring 7 mm. No other renal masses or lesions. No stones. There is irregular wall thickening of the posterior inferior bladder. Bladder is partly decompressed by Foley catheter. Stomach/Bowel: Moderate size hiatal hernia. Stomach otherwise unremarkable. There has been a previous partial colon resection. There are right and left: Bowel anastomosis staple lines. A few left colon diverticula noted. There is no evidence diverticulitis or other colonic inflammatory process. The small bowel is unremarkable. Normal appendix is visualized. There is a 16 mm nodule adjacent to the left transverse colon in the left upper quadrant, within the peritoneal cavity. Vascular/Lymphatic: There is bulky retroperitoneal adenopathy. Several reference measurements were made. An enlarged node or confluence of lymph nodes surrounds the aorta at the level of the of the renal vessels. An aortocaval portion of this measures 2.4 cm in short axis. There are enlarged pelvic lymph nodes. Largest is a left external iliac chain node measuring 3.7 x 2.8 cm. There is an enlarged left inguinal lymph node measuring 1.6 cm in short axis. There is  aortic and branch vessel atherosclerotic calcifications. No aneurysm or dissection. The inferior vena cava is compressed have the level of the retroperitoneal adenopathy. There is also compression of common iliac veins. There may be thrombus in both common femoral veins versus mixed unenhanced and enhanced blood. Reproductive: Status post hysterectomy. No adnexal masses. Other: Trace ascites is seen inferior to the pancreas and along the left anterior pararenal fascia, and collecting  in the posterior pelvic recess. Musculoskeletal: No acute fracture. No osteoblastic or osteolytic lesions. IMPRESSION: 1. There is bulky retroperitoneal, pelvic and left inguinal adenopathy, new since the prior CT. Nodes compress the inferior vena cava and iliac veins. There may be thrombus in the common femoral veins. Consider follow-up bilateral lower extremity venous Doppler evaluation. 2. Bilateral ureteral stents are well positioned. There is mild to moderate right and mild left hydronephrosis. 3. Irregular wall thickening of the posterior and inferior bladder. This may be inflammatory or reflect neoplasm. 4. Moderate hiatal hernia. 5. 16 mm soft tissue nodule in the left upper quadrant anterior peritoneal cavity a reflect an enlarged lymph node or a neoplastic peritoneal deposit. 6. Trace ascites. 7. Aortic atherosclerosis. Electronically Signed   By: Lajean Manes M.D.   On: 11/07/2016 13:47   US Renal  Result Date: 10/27/2016 CLINICAL DATA:  Acute renal failure EXAM: RENAL / URINARY TRACT ULTRASOUND COMPLETE COMPARISON:  None. FINDINGS: Right Kidney: Length: 10.9 cm. Echogenicity and renal cortical thickness are within normal limits. No mass or perinephric fluid visualized. There is moderate hydronephrosis on the right. No sonographically demonstrable calculus or ureterectasis. Left Kidney: Length: 9.5 cm. Echogenicity and renal cortical thickness are within normal limits. No mass or perinephric fluid visualized. There is moderate hydronephrosis on the left. No sonographically demonstrable calculus or ureterectasis. Bladder: There is a hypoechoic lesion along the posterior aspect of the urinary bladder which shows moderate vascularity measuring 4.6 x 2.0 cm. This mass may be infiltrating the posterior bladder wall. Note that there is slight postvoid residual in the urinary bladder. IMPRESSION: Hypoechoic apparent mass along the posterior aspect of the urinary bladder which may be infiltrating into the  posterior bladder wall. There is moderate hydronephrosis bilaterally, possibly due to a degree of obstruction of the ureterovesical junctions from the apparent bladder tumor. There is mild postvoid residual in the urinary bladder. These results will be called to the ordering clinician or representative by the Radiologist Assistant, and communication documented in the PACS or zVision Dashboard. Electronically Signed   By: Lowella Grip III M.D.   On: 10/27/2016 14:45   US Venous Img Lower Bilateral  Result Date: 11/07/2016 CLINICAL DATA:  Left leg pain. Possible femoral vein thrombosis on CT. EXAM: BILATERAL LOWER EXTREMITY VENOUS DOPPLER ULTRASOUND TECHNIQUE: Gray-scale sonography with graded compression, as well as color Doppler and duplex ultrasound were performed to evaluate the lower extremity deep venous systems from the level of the common femoral vein and including the common femoral, femoral, profunda femoral, popliteal and calf veins including the posterior tibial, peroneal and gastrocnemius veins when visible. The superficial great saphenous vein was also interrogated. Spectral Doppler was utilized to evaluate flow at rest and with distal augmentation maneuvers in the common femoral, femoral and popliteal veins. COMPARISON:  None. FINDINGS: RIGHT LOWER EXTREMITY Common Femoral Vein: No evidence of thrombus. Normal compressibility, respiratory phasicity and response to augmentation. Saphenofemoral Junction: No evidence of thrombus. Profunda Femoral Vein: No evidence of thrombus. Femoral Vein: No evidence of thrombus. Popliteal Vein: No evidence of thrombus. Calf Veins: No  evidence of thrombus. Superficial Great Saphenous Vein: No evidence of thrombus. LEFT LOWER EXTREMITY Common Femoral Vein: Nonocclusive luminal clot is seen within the partially compressible vessel. There is neighboring bulky adenopathy in this patient with known malignancy. Saphenofemoral Junction: No evidence of thrombus.  Profunda Femoral Vein: No evidence of thrombus. Femoral Vein: No evidence of thrombus. Popliteal Vein: No evidence of thrombus. Calf Veins: Thrombus within a posterior tibial vein, occlusive. Other Findings: Diffusely monophasic flow correlating with pelvic retroperitoneal adenopathy impinging on the iliac vein. IMPRESSION: 1. Confirmed left lower extremity DVT, nonocclusive in the common femoral vein and occlusive in the posterior tibial vein. 2. 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 Electronically Signed   By: Monte Fantasia M.D.   On: 11/07/2016 16:10    Labs:  CBC:  Recent Labs  11/12/16 0424 11/12/16 1715 11/13/16 0058 11/13/16 1228 11/14/16 0345  WBC 12.8*  --  20.4* 18.4* 15.5*  HGB 7.6* 10.2* 8.6* 7.3* 9.0*  HCT 23.0* 30.3* 24.9* 21.2* 25.7*  PLT 391  --  336 319 245    COAGS:  Recent Labs  11/08/16 1221  11/12/16 0819 11/12/16 1715 11/13/16 0058 11/13/16 1050 11/14/16 0345  INR 1.97  --  2.86  --   --   --  1.25  APTT 40*  < > 49* 106* 158* 97* 31  < > = values in this interval not displayed.  BMP:  Recent Labs  11/11/16 0418 11/13/16 0058 11/13/16 1228 11/14/16 0345  NA 138 131* 134* 136  K 3.9 5.3* 4.2 2.9*  CL 106 101 102 106  CO2 24 19* 20* 21*  GLUCOSE 118* 135* 128* 102*  BUN 24* 61* 66* 58*  CALCIUM 8.5* 9.0 8.4* 7.7*  CREATININE 0.94 3.01* 2.93* 1.90*  GFRNONAA 57* 14* 14* 24*  GFRAA >60 16* 17* 28*    LIVER FUNCTION TESTS:  Recent Labs  06/22/16 1345 08/03/16 1315 10/06/16 1316 11/07/16 1023  BILITOT 0.7 0.7 0.5 0.8  AST 30 31 31 27   ALT 23 17 22 27   ALKPHOS 45 44 47 49  PROT 6.3* 5.9* 5.9* 6.1*  ALBUMIN 3.9 3.7 3.4* 3.3*    TUMOR MARKERS: No results for input(s): AFPTM, CEA, CA199, CHROMGRNA in the last 8760 hours.  Assessment and Plan:  76 yo with metastatic cancer and bladder mass.  Patient's primary problem right now is hematuria and hydronephrosis with  abnormal renal function.  Plan for placement of bilateral percutaneous nephrostomy tubes.  Discussed procedures with patient and informed consent obtained.  Urology will remove ureter stents after nephrostomy tubes have been placed.    Thank you for this interesting consult.  I greatly enjoyed meeting Shaunita T Langer and look forward to participating in their care.  A copy of this report was sent to the requesting provider on this date.  Electronically Signed: Carylon Perches, MD 11/14/2016, 8:19 AM

## 2016-11-14 NOTE — Anesthesia Post-op Follow-up Note (Signed)
Anesthesia QCDR form completed.        

## 2016-11-14 NOTE — Progress Notes (Signed)
Day of Surgery  Subjective: Mrs. Courtney [name] has a bladder mass with gross hematuria and ureteral obstruction complicated by DVT and need for anticoagulation.   She was irrigated with return of about 237ml of clot yesterday but a post irrigation [name] still showed significant clot in the bladder.  She is on CBI and the foley is draining light pink urine without clots at this time but the foley is probably just in a cavity in the clots.  She has ARI but the CR is down to 1.9 this am and her Hgb is up to 9.   She is complaining primarily of back pain.  ROS:  ROS  Anti-infectives: Anti-infectives    Start     Dose/Rate Route Frequency Ordered Stop   11/14/16 0600  ceFAZolin (ANCEF) IVPB 2g/100 mL premix     2 g 200 mL/hr over 30 Minutes Intravenous  Once 11/13/16 2025        Current Facility-Administered Medications  Medication Dose Route Frequency Provider Last Rate Last Dose  . 0.9 %  sodium chloride infusion   Intravenous Continuous Harrie Foreman, MD 100 mL/hr at 11/14/16 0010    . acetaminophen (TYLENOL) tablet 650 mg  650 mg Oral Q6H PRN Loletha Grayer, MD   650 mg at 11/13/16 2046   Or  . acetaminophen (TYLENOL) suppository 650 mg  650 mg Rectal Q6H PRN Loletha Grayer, MD      . bisacodyl (DULCOLAX) suppository 10 mg  10 mg Rectal Daily Bettey Costa, MD   Stopped at 11/13/16 1000  . ceFAZolin (ANCEF) IVPB 2g/100 mL premix  2 g Intravenous Once Irine Seal, MD      . cholecalciferol (VITAMIN D) tablet 1,000 Units  1,000 Units Oral Daily Loletha Grayer, MD   1,000 Units at 11/12/16 1118  . cyanocobalamin tablet 1,000 mcg  1,000 mcg Oral Daily Loletha Grayer, MD   1,000 mcg at 11/12/16 1118  . docusate sodium (COLACE) capsule 100 mg  100 mg Oral BID Bettey Costa, MD   100 mg at 11/13/16 2050  . ferrous sulfate tablet 325 mg  325 mg Oral TID WC Loletha Grayer, MD   325 mg at 11/13/16 1819  . hydrALAZINE (APRESOLINE) injection 10 mg  10 mg Intravenous Q4H PRN Saundra Shelling, MD   10 mg  at 11/10/16 0112  . morphine 2 MG/ML injection 1 mg  1 mg Intravenous Q4H PRN Hugelmeyer, Alexis, DO   1 mg at 11/14/16 0516  . morphine 2 MG/ML injection 2 mg  2 mg Intravenous Once Nena Polio, MD      . multivitamin with minerals tablet 1 tablet  1 tablet Oral Daily Loletha Grayer, MD   1 tablet at 11/11/16 1001  . multivitamin-lutein (OCUVITE-LUTEIN) capsule 2 capsule  2 capsule Oral Daily Loletha Grayer, MD   2 capsule at 11/12/16 1120  . ondansetron (ZOFRAN) injection 4 mg  4 mg Intravenous Q8H PRN Hugelmeyer, Alexis, DO   4 mg at 11/13/16 0906  . opium-belladonna (B&O SUPPRETTES) 16.2-60 MG suppository 1 suppository  1 suppository Rectal Q8H PRN Hollice Espy, MD   1 suppository at 11/14/16 [phone]  . oxyCODONE (Oxy IR/ROXICODONE) immediate release tablet 5 mg  5 mg Oral Q6H PRN Loletha Grayer, MD   5 mg at 11/13/16 2256  . phenazopyridine (PYRIDIUM) tablet 100 mg  100 mg Oral TID PRN Hollice Espy, MD   100 mg at 11/11/16 1746  . polyethylene glycol (MIRALAX / GLYCOLAX) packet 17 g  17 g  Oral Daily Bettey Costa, MD   Stopped at 11/13/16 1000  . risperiDONE (RISPERDAL) tablet 0.25 mg  0.25 mg Oral BID PRN Bettey Costa, MD   0.25 mg at 11/09/16 0936     Objective: Vital signs in last 24 hours: Temp:  [98.1 F (36.7 C)-98.4 F (36.9 C)] 98.2 F (36.8 C) (08/24 2305) Pulse Rate:  [90-118] 90 (08/24 2300) Resp:  [12-20] 20 (08/24 2300) BP: (131-155)/(53-72) 145/65 (08/24 2300) SpO2:  [98 %-99 %] 99 % (08/24 2300)  Intake/Output from previous day: 08/24 0701 - 08/25 0700 In: 22180 [P.O.:1000; I.V.:1830; Blood:350] Out: 96295 [MWUXL:24401] Intake/Output this shift: Total I/O In: 10663.3 [P.O.:760; I.V.:553.3; Blood:350; Other:9000] Out: 10325 [Urine:10325]   Physical Exam  Constitutional:  Elderly, ill-appearing WF in NAD.   Cardiovascular: Normal rate and regular rhythm.   Pulmonary/Chest: Effort normal and breath sounds normal.  Abdominal: Soft. There is tenderness  (in the suprapubic area. ).  Genitourinary:  Genitourinary Comments: Foley on CBI draining light pink urine.   Vitals reviewed.   Lab Results:   Recent Labs  11/13/16 1228 11/14/16 0345  WBC 18.4* 15.5*  HGB 7.3* 9.0*  HCT 21.2* 25.7*  PLT 319 245   BMET  Recent Labs  11/13/16 1228 11/14/16 0345  NA 134* 136  K 4.2 2.9*  CL 102 106  CO2 20* 21*  GLUCOSE 128* 102*  BUN 66* 58*  CREATININE 2.93* 1.90*  CALCIUM 8.4* 7.7*   PT/INR  Recent Labs  11/12/16 0819 11/14/16 0345  LABPROT 30.6* 15.8*  INR 2.86 1.25   ABG No results for input(s): PHART, HCO3 in the last 72 hours.  Invalid input(s): PCO2, PO2  Studies/Results: Ct Abdomen Pelvis Wo Contrast  Result Date: 11/13/2016 CLINICAL DATA:  Hematuria. Falling hemoglobin. Ureteral stents in place. EXAM: CT ABDOMEN AND PELVIS WITHOUT CONTRAST TECHNIQUE: Multidetector CT imaging of the abdomen and pelvis was performed following the standard protocol without IV contrast. COMPARISON:  CT 11/07/2016 FINDINGS: Lower chest: Nodule in the RIGHT lower lobe measures 9 mm (image 6, series 4). This was not imaged on comparison exam. Hepatobiliary: No focal hepatic lesion on noncontrast exam. Postcholecystectomy Pancreas: Pancreas is normal. No ductal dilatation. No pancreatic inflammation. Spleen: Normal spleen Adrenals/urinary tract: Adrenal glands are normal. Bilateral ureteral stents in place. Mild hydronephrosis similar to comparison exam. Ureteral stents terminate in the bladder. There is high-density material filling the bladder. The bladder is mildly distended. Approximately 60% bladder is filled with the high density material presumed hemorrhage. Some thickening along the posterior wall again noted. Stomach/Bowel: Moderate hiatal hernia. Small bowel anastomosis in the mid abdomen. No high-grade obstruction. Contrast transits into the ascending colon. Appendix normal. Transverse and descending colon normal. Rectum appears normal.  Vascular/Lymphatic: Periaortic retroperitoneal adenopathy again noted with masslike adenopathy at the level of the renal veins. Low-density external iliac adenopathy again noted greater on the LEFT measuring up to 2.5 cm short axis. LEFT inguinal adenopathy also again noted. Reproductive: Post hysterectomy Other: Peritoneal implant in the LEFT upper quadrant measuring 1.5 cm (image 21, series 3) again noted. Small implant inferior to the lower pole of LEFT kidney measuring 10 mm . Musculoskeletal: No aggressive osseous lesion. IMPRESSION: 1. New high-density material within the bladder consistent with bladder hematoma. Hematoma occupies approximate 60% of the bladder volume. Bladder mildly distended. 2. Bilateral ureteral stents in place.  Mild hydronephrosis. 3. Bulky periaortic and iliac adenopathy. 4. Suspicion for peritoneal implants in the LEFT upper quadrant. These results will be called to the  ordering clinician or representative by the Radiologist Assistant, and communication documented in the PACS or zVision Dashboard. Electronically Signed   By: Suzy Bouchard M.D.   On: 11/13/2016 10:56   US Pelvis Limited  Result Date: 11/13/2016 CLINICAL DATA:  Hematuria. EXAM: LIMITED ULTRASOUND OF PELVIS TECHNIQUE: Limited transabdominal ultrasound examination of the pelvis was performed. COMPARISON:  GE CT from 11/13/2016 FINDINGS: The urinary bladder is distended by a large complicated heterogeneous mass corresponding to the high density material identified on CT from today. Other findings:  None. IMPRESSION: 1. Large complex heterogeneous mass within the urinary bladder compatible with bladder hematoma. Electronically Signed   By: Kerby Moors M.D.   On: 11/13/2016 16:47   I have reviewed her labs and films and discussed the case with Dr. Erlene Quan.    Assessment and Plan: Bladder mass with ureteral obstruction and clot retention with persistent large clot on [name] yesterday after bladder irrigation.    Current plan is to go to the OR for cystoscopy with clot evacuation and removal of bilateral ureteral stents later this morning.   She will then have bilateral nephrostomy tubes placed.         LOS: 7 days    Courtney [name] 11/14/2016 [phone] ID: Courtney [name], female   DOB: 04-09-1940, 76 y.o.   MRN: [id number]

## 2016-11-14 NOTE — Op Note (Signed)
Courtney [name], [name]                  ACCOUNT NO.:  [id number]  MEDICAL RECORD NO.:  [id number]  LOCATION:  206A                         FACILITY:  [facility]  PHYSICIAN:  Marshall Cork. Jeffie Pollock, M.D.    DATE OF BIRTH:  1940-11-18  DATE OF PROCEDURE:  11/14/2016 DATE OF DISCHARGE:                              OPERATIVE REPORT   PROCEDURE:  Cystoscopy with removal of bilateral double-J stents, clot evacuation with fulguration, and transurethral resection of a large bladder tumor.  PREOPERATIVE DIAGNOSIS:  Clot retention with retained stents.  POSTOPERATIVE DIAGNOSIS:  Clot retention with retained stents with a large residual bladder tumor involving overlapping site.  ANESTHESIA:  General.  SPECIMEN:  Tumor chips.  DRAINS:  A 22-French 3-way Foley catheter.  BLOOD LOSS:  50 mL.  COMPLICATIONS:  None.  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 She had bilateral ureteral obstruction and had undergone a previous resection with placement of ureteral stents.  Her course was complicated by DVT and anticoagulation was resumed.  With this, she had marked hematuria and a clot evacuation yesterday on the floor was unsuccessful in completely eliminating the clots.  Because of the ongoing bleeding and the need for anticoagulation, the decision was made to place bilateral percutaneous nephrostomy tubes, which was done earlier today and she returns now for cystoscopy with removal of the ureteral catheters and clot evacuation.  FINDINGS AND PROCEDURE:  She was given Ancef.  She was taken to the operating room where general anesthetic was induced.  She was placed in lithotomy position.  Her perineum and genitalia were prepped with Betadine solution.  She was draped in usual sterile fashion.  Cystoscopy was performed using a 23-French scope and 30-degree lens. Examination revealed a large amount of clot in  the bladder and bilateral ureteral catheters.  I was able to remove the ureteral catheters with grasping forceps, but was only able to remove the small amount of the clot with the cystoscope.  I then changed to a 28-French continuous-flow resectoscope with an Iglesias handle 30-degree lens and bipolar loop.  I was able to irrigate more clot out with this instrument because of larger diameter, but on further inspection, there remained a large mobile what I initially thought to be a clot in the bladder that would not relent with irrigation.  I then used the resectoscope to begin the carve into the clot to fragment it sufficient for evacuation and during this process, I realized that I was dealing with what appeared to be a large residual tumor mass that seemed to be free floating within the bladder, but exceeded 5 cm in diameter.  This was gradually resected to the point where the fragments could be removed because it did appear to have a fleshy appearance consistent with tumor.  I sent a specimen for confirmation.  Once all of the mobile clot/tumor had been resected and removed, inspection revealed some active bleeding from other areas of tumor on the base of the bladder.  These were generously fulgurated with coagulation.  Once hemostasis had been achieved, the resectoscope was removed and a 22-French  3-way Foley catheter was inserted.  The balloon was filled with 20 mL of fluid and the catheter was placed on continuous irrigation and straight drainage with clear return.  She was taken down from lithotomy position.  Her anesthetic was reversed.  She was moved to the recovery room in stable condition. There were no complications.     Marshall Cork. Jeffie Pollock, M.D.     JJW/MEDQ  D:  11/14/2016  T:  11/14/2016  Job:  [id number]

## 2016-11-14 NOTE — Transfer of Care (Signed)
Immediate Anesthesia Transfer of Care Note  Patient: Courtney [name]  Procedure(s) Performed: Procedure(s): cystoscopy with clot evacuation,fulguration of bleeders, and removal of bilateral stents (Bilateral)  Patient Location: PACU  Anesthesia Type:General  Level of Consciousness: drowsy and patient cooperative  Airway & Oxygen Therapy: Patient Spontanous Breathing and Patient connected to face mask oxygen  Post-op Assessment: Report given to RN and Post -op Vital signs reviewed and stable  Post vital signs: Reviewed and stable  Last Vitals:  Vitals:   11/14/16 1030 11/14/16 1059  BP: 139/63 (!) 140/97  Pulse: 92 (!) 116  Resp:  20  Temp: 36.5 C 36.8 C  SpO2: 99%     Last Pain:  Vitals:   11/14/16 1059  TempSrc: Oral  PainSc:       Patients Stated Pain Goal: 0 (96/78/93 8101)  Complications: No apparent anesthesia complications

## 2016-11-14 NOTE — Progress Notes (Signed)
[facility] NAME: Mesa Janus    MR#:  [id number]  DATE OF BIRTH:  June 14, 1946  SUBJECTIVE:   The patient is s/p bilateral nephrostomy tube placement this morning, sedated that time. REVIEW OF SYSTEMS:    Review of Systems   Review of Systems  Constitutional: Negative for fever, chills weight loss, generalized weakness. HENT: Negative for ear pain, nosebleeds, congestion, facial swelling, rhinorrhea, neck pain, neck stiffness and ear discharge.   Respiratory: Negative for cough, shortness of breath, wheezing  Cardiovascular: Negative for chest pain, palpitations and leg swelling.  Gastrointestinal: Negative for heartburn, abdominal pain, vomiting, diarrhea or consitpation Genitourinary: Negative for dysuria, urgency, frequency, ++ hematuria  Musculoskeletal: Negative for back pain or joint pain Neurological: Negative for dizziness, seizures, syncope, focal weakness,  numbness and headaches.  Hematological: Does not bruise/bleed easily.  Psychiatric/Behavioral: Negative for hallucinations, confusion, dysphoric mood    DRUG ALLERGIES:   Allergies  Allergen Reactions  . Other Swelling    Magic mouthwash  . Penicillins Swelling    Has patient had a PCN reaction causing immediate rash, facial/tongue/throat swelling, SOB or lightheadedness with hypotension: No Has patient had a PCN reaction causing severe rash involving mucus membranes or skin necrosis: No Has patient had a PCN reaction that required hospitalization: No Has patient had a PCN reaction occurring within the last 10 years: No If all of the above answers are "NO", then may proceed with Cephalosporin use.    . Ciprofloxacin Diarrhea    GI distress, abd cramping    VITALS:  Blood pressure (!) 105/53, pulse 96, temperature 97.6 F (36.4 C), temperature source Oral, resp. rate 18, height 4\' 11"  (1.499 m), weight 129 lb (58.5 kg), SpO2 100 %.  PHYSICAL EXAMINATION:   Constitutional: Appears well-developed and well-nourished. No distress. looks pale. HENT: Normocephalic. Marland Kitchen Oropharynx is clear and moist.  Eyes: Conjunctivae and EOM are normal. PERRLA, no scleral icterus. Pale conjunctiva. Neck: Normal ROM. Neck supple. No JVD. No tracheal deviation. CVS: RRR, S1/S2 +, no murmurs, no gallops, no carotid bruit.  Pulmonary: Effort and breath sounds normal, no stridor, rhonchi, wheezes, rales.  Abdominal: Soft. BS +,  no distension, tenderness, rebound or guarding. Bloody drainage in bilateral nephrostomy tube bags. Musculoskeletal: Normal range of motion. No edema and no tenderness.  Neuro: patient is drowsy and sedated. Skin: Skin is warm and dry. No rash noted.  LABORATORY PANEL:   CBC  Recent Labs Lab 11/14/16 0345  WBC 15.5*  HGB 9.0*  HCT 25.7*  PLT 245   ------------------------------------------------------------------------------------------------------------------  Chemistries   Recent Labs Lab 11/14/16 0345  NA 136  K 2.9*  CL 106  CO2 21*  GLUCOSE 102*  BUN 58*  CREATININE 1.90*  CALCIUM 7.7*  MG 1.6*   ------------------------------------------------------------------------------------------------------------------  Cardiac Enzymes No results for input(s): TROPONINI in the last 168 hours. ------------------------------------------------------------------------------------------------------------------  RADIOLOGY:  Ct Abdomen Pelvis Wo Contrast  Result Date: 11/13/2016 CLINICAL DATA:  Hematuria. Falling hemoglobin. Ureteral stents in place. EXAM: CT ABDOMEN AND PELVIS WITHOUT CONTRAST TECHNIQUE: Multidetector CT imaging of the abdomen and pelvis was performed following the standard protocol without IV contrast. COMPARISON:  CT 11/07/2016 FINDINGS: Lower chest: Nodule in the RIGHT lower lobe measures 9 mm (image 6, series 4). This was not imaged on comparison exam. Hepatobiliary: No focal hepatic lesion on noncontrast exam.  Postcholecystectomy Pancreas: Pancreas is normal. No ductal dilatation. No pancreatic inflammation. Spleen: Normal spleen Adrenals/urinary tract: Adrenal glands are normal. Bilateral  ureteral stents in place. Mild hydronephrosis similar to comparison exam. Ureteral stents terminate in the bladder. There is high-density material filling the bladder. The bladder is mildly distended. Approximately 60% bladder is filled with the high density material presumed hemorrhage. Some thickening along the posterior wall again noted. Stomach/Bowel: Moderate hiatal hernia. Small bowel anastomosis in the mid abdomen. No high-grade obstruction. Contrast transits into the ascending colon. Appendix normal. Transverse and descending colon normal. Rectum appears normal. Vascular/Lymphatic: Periaortic retroperitoneal adenopathy again noted with masslike adenopathy at the level of the renal veins. Low-density external iliac adenopathy again noted greater on the LEFT measuring up to 2.5 cm short axis. LEFT inguinal adenopathy also again noted. Reproductive: Post hysterectomy Other: Peritoneal implant in the LEFT upper quadrant measuring 1.5 cm (image 21, series 3) again noted. Small implant inferior to the lower pole of LEFT kidney measuring 10 mm . Musculoskeletal: No aggressive osseous lesion. IMPRESSION: 1. New high-density material within the bladder consistent with bladder hematoma. Hematoma occupies approximate 60% of the bladder volume. Bladder mildly distended. 2. Bilateral ureteral stents in place.  Mild hydronephrosis. 3. Bulky periaortic and iliac adenopathy. 4. Suspicion for peritoneal implants in the LEFT upper quadrant. These results will be called to the ordering clinician or representative by the Radiologist Assistant, and communication documented in the PACS or zVision Dashboard. Electronically Signed   By: Suzy Bouchard M.D.   On: 11/13/2016 10:56   US Pelvis Limited  Result Date: 11/13/2016 CLINICAL DATA:   Hematuria. EXAM: LIMITED ULTRASOUND OF PELVIS TECHNIQUE: Limited transabdominal ultrasound examination of the pelvis was performed. COMPARISON:  GE CT from 11/13/2016 FINDINGS: The urinary bladder is distended by a large complicated heterogeneous mass corresponding to the high density material identified on CT from today. Other findings:  None. IMPRESSION: 1. Large complex heterogeneous mass within the urinary bladder compatible with bladder hematoma. Electronically Signed   By: Kerby Moors M.D.   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 Hydronephrosis and hematuria. Patient currently has bilateral ureter stents and plan for placement of bilateral nephrostomy tubes. EXAM: PLACEMENT OF LEFT PERCUTANEOUS NEPHROSTOMY TUBE WITH FLUOROSCOPY AND ULTRASOUND GUIDANCE PLACEMENT OF RIGHT PERCUTANEOUS NEPHROSTOMY TUBE WITH FLUOROSCOPY AND ULTRASOUND GUIDANCE COMPARISON:  None. MEDICATIONS: Clindamycin 300 mg; The antibiotic was administered in an appropriate time frame prior to skin puncture. ANESTHESIA/SEDATION: Fentanyl 75 mcg IV; Versed 3.0 mg IV Moderate Sedation Time:  70 minutes The patient was continuously monitored during the procedure by the interventional radiology nurse under my direct supervision. CONTRAST:  15 mL Isovue 300 - administered into the collecting system(s) FLUOROSCOPY TIME:  Fluoroscopy Time: 4 minutes 24 seconds (40 mGy). COMPLICATIONS: None immediate. PROCEDURE: Informed written consent was obtained from the patient after a thorough discussion of the procedural risks, benefits and alternatives. All questions were addressed. Maximal Sterile Barrier Technique was utilized including caps, mask, sterile gowns, sterile gloves, sterile drape, hand hygiene and skin antiseptic. A timeout was performed prior to the initiation of the procedure. Patient was placed prone. Both kidneys were  identified with ultrasound. Both flanks were prepped and draped in sterile fashion. Maximal barrier sterile technique was utilized including caps, mask, sterile gowns, sterile gloves, sterile drape, hand hygiene and skin antiseptic. Left flank was anesthetized with 1% lidocaine. Delta needle was directed into left kidney lower pole calyx with ultrasound guidance. Percutaneous access in left kidney was difficult due to location of the kidney with relation  to the ribs. Wire was advanced into the renal collecting system. Accustick dilator set was placed. Contrast injection confirmed placement in the renal collecting system. A J wire was placed. The tract was dilated to accommodate a 10.2 [place] multipurpose drain. Drain was positioned in the renal pelvis. Additional contrast was injected to confirm placement. Catheter was sutured to the skin. Drain was placed to gravity bag. Attention directed to the right kidney. Skin was anesthetized with 1% lidocaine in the right flank. 22 gauge needle was advanced into a lower pole calyx with ultrasound guidance. Wire was advanced into the collecting system. Accustick dilator set was placed. 10.2 [place] multipurpose drain was placed over a J wire without difficulty. Drain was placed in the renal pelvic region. Contrast injection confirmed placement in the renal collecting system. Catheter sutured to skin and attached to a gravity bag. Fluoroscopic and ultrasound images were taken and saved for documentation. FINDINGS: Mild to moderate hydronephrosis bilaterally. Nephrostomy tubes are in the renal pelvis bilaterally. IMPRESSION: Successful placement of bilateral percutaneous nephrostomy tubes with ultrasound and fluoroscopic guidance. Electronically Signed   By: Markus Daft M.D.   On: 11/14/2016 11:25   Ir Nephrostomy Placement Right  Result Date: 11/14/2016 INDICATION: 76 year old with metastatic cancer and bladder involvement. Hydronephrosis and hematuria. Patient  currently has bilateral ureter stents and plan for placement of bilateral nephrostomy tubes. EXAM: PLACEMENT OF LEFT PERCUTANEOUS NEPHROSTOMY TUBE WITH FLUOROSCOPY AND ULTRASOUND GUIDANCE PLACEMENT OF RIGHT PERCUTANEOUS NEPHROSTOMY TUBE WITH FLUOROSCOPY AND ULTRASOUND GUIDANCE COMPARISON:  None. MEDICATIONS: Clindamycin 300 mg; The antibiotic was administered in an appropriate time frame prior to skin puncture. ANESTHESIA/SEDATION: Fentanyl 75 mcg IV; Versed 3.0 mg IV Moderate Sedation Time:  70 minutes The patient was continuously monitored during the procedure by the interventional radiology nurse under my direct supervision. CONTRAST:  15 mL Isovue 300 - administered into the collecting system(s) FLUOROSCOPY TIME:  Fluoroscopy Time: 4 minutes 24 seconds (40 mGy). COMPLICATIONS: None immediate. PROCEDURE: Informed written consent was obtained from the patient after a thorough discussion of the procedural risks, benefits and alternatives. All questions were addressed. Maximal Sterile Barrier Technique was utilized including caps, mask, sterile gowns, sterile gloves, sterile drape, hand hygiene and skin antiseptic. A timeout was performed prior to the initiation of the procedure. Patient was placed prone. Both kidneys were identified with ultrasound. Both flanks were prepped and draped in sterile fashion. Maximal barrier sterile technique was utilized including caps, mask, sterile gowns, sterile gloves, sterile drape, hand hygiene and skin antiseptic. Left flank was anesthetized with 1% lidocaine. [place] needle was directed into left kidney lower pole calyx with ultrasound guidance. Percutaneous access in left kidney was difficult due to location of the kidney with relation to the ribs. Wire was advanced into the renal collecting system. Accustick dilator set was placed. Contrast injection confirmed placement in the renal collecting system. A J wire was placed. The tract was dilated to accommodate a 10.2  [place] multipurpose drain. Drain was positioned in the renal pelvis. Additional contrast was injected to confirm placement. Catheter was sutured to the skin. Drain was placed to gravity bag. Attention directed to the right kidney. Skin was anesthetized with 1% lidocaine in the right flank. 22 gauge needle was advanced into a lower pole calyx with ultrasound guidance. Wire was advanced into the collecting system. Accustick dilator set was placed. 10.2 [place] multipurpose drain was placed over a J wire without difficulty. Drain was placed in the renal pelvic region. Contrast injection confirmed placement in  the renal collecting system. Catheter sutured to skin and attached to a gravity bag. Fluoroscopic and ultrasound images were taken and saved for documentation. FINDINGS: Mild to moderate hydronephrosis bilaterally. Nephrostomy tubes are in the renal pelvis bilaterally. IMPRESSION: Successful placement of bilateral percutaneous nephrostomy tubes with ultrasound and fluoroscopic guidance. Electronically Signed   By: Markus Daft M.D.   On: 11/14/2016 11:25     ASSESSMENT AND PLAN:   [name] with history of progressive metastatic GIST dx 02/2014 and recent diagnosis of bladder tumor s/p TURBT and bilateral placement who presented with left leg pain and found to have DVT.   1. Left lower extremity DVT, nonocclusive in the common femoral vein and occlusive in the posterior tibial vein:due to underlying malignancy Due to anemia and possible need for Bilateral nephrostomy tubes, Eliquis was discontinuedand restarted heparin treatment. She can not have IVCF due to underlying cancer and location on IVC. Heparin drip was discontinued yesterday due to bladder hematoma,hematuria and decreased hemoglobin.  2. Acute on chronic blood loss anemia with recent diagnosis of bladder tumor status post TURBT: Status post 1 unit PRBC 8/19 and 1 unit on 8/23. Hemoglobin dropped to 7.3 yesterday and got 1 unit of PRBC. Hb  9.0. Follow-up hemoglobin.  3.history of GISTT and bladder tumor:  Appreciate urology and oncology evaluation Diagnosis is still unknown. Pathology is pending. Patient had bilateral stents due to bilateral hydronephrosis, which is removed today. S/P bilateral nephrostomy tubes by interventional radiologist this am. S/p Cystoscopy with removal of bilateral double-J stents, clot evacuation with fulguration, and transurethral resection of a large bladder tumor just now.  4.hypokalemia: K 2.9, given potassium supplement and follow-up BMP.  Hypomagnesemia. Give IV magnesium and follow-up level.  5. Delirium:Resolved.  Acute renal failure. Possible due to obstruction secondary to bilateral hematoma. Hematoma was removed by Dr. Rayburn Ma. Improving.  Continue IV fluid support and follow-up BMP tomorrow.  Hyperkalemia. The patient was given 1 dose Kayexalate, improved.  Physical therapy was not recommending further PT follow-up on 11/10/2016  Management plans discussed with nurse, the patient, her husband, brother, who are in agreement CODE STATUS: full  [place]: 39 minutes.   POSSIBLE D/C ?? DEPENDING ON CLINICAL CONDITION.   Demetrios Loll M.D on 11/14/2016 at 3:21 PM  Between 7am to 6pm - Pager - [phone] After 6pm go to [email or web address] - password [email or web address] [facility]  Office  [phone]  CC: Primary care physician; Rusty Aus, MD  Note: This dictation was prepared with Dragon dictation along with smaller phrase technology. Any transcriptional errors that result from this process are unintentional.

## 2016-11-14 NOTE — Progress Notes (Signed)
Spoke to Dr. Jeffie Pollock order to start patient on clear liquid diet and advance as tolerated; also made Dr. Jeffie Pollock aware that bladder irrigation is clear; verbal order to clear foley and remove in am @6 :00 am if not complication and only if  Foley irrigation is clear. Will continue to monitor

## 2016-11-14 NOTE — Brief Op Note (Signed)
11/07/2016 - 11/14/2016  1:20 PM  PATIENT:  Courtney [name]  76 y.o. female  PRE-OPERATIVE DIAGNOSIS:  clot retention with retained stents  POST-OPERATIVE DIAGNOSIS:  Same with residual tumor. Overlapping sites.  PROCEDURE:  Procedure(s): cystoscopy with clot evacuation,fulguration of bleeders, and removal of bilateral stents (Bilateral) TURBT large Overlapping sites  SURGEON:  Surgeon(s) and Role:    Irine Seal, MD - Primary  PHYSICIAN ASSISTANT:   ASSISTANTS: none   ANESTHESIA:   general  EBL:  Total I/O In: 2500 [I.V.:500; Other:2000] Out: -   BLOOD ADMINISTERED:none  DRAINS: Urinary Catheter (Foley)   LOCAL MEDICATIONS USED:  NONE  SPECIMEN:  Source of Specimen:  bladder tumor and clots  DISPOSITION OF SPECIMEN:  PATHOLOGY  COUNTS:  YES  TOURNIQUET:  * No tourniquets in log *  DICTATION: .Other Dictation: Dictation Number [id number]  PLAN OF CARE: Admit to inpatient   PATIENT DISPOSITION:  PACU - guarded condition.   Delay start of Pharmacological VTE agent (>24hrs) due to surgical blood loss or risk of bleeding: yes

## 2016-11-14 NOTE — Procedures (Signed)
Placement of bilateral nephrostomy tubes.  10.2 Fr drains placed bilaterally.  Minimal blood loss and no immediate complication.

## 2016-11-14 NOTE — Progress Notes (Signed)
Pt pulled catheter out approx. 2200, balloon was not fully inflated when assessed and Pt  Had no damage to the area. MD notified and order to replace with 92F foley catheter. Catheter inserted by Nedra Hai RN assisted by Lupita Rondo Spittler, RN.

## 2016-11-14 NOTE — Anesthesia Procedure Notes (Signed)
Procedure Name: Intubation Date/Time: 11/14/2016 12:24 PM Performed by: Silvana Newness Pre-anesthesia Checklist: Patient identified, Emergency Drugs available, Suction available, Patient being monitored and Timeout performed Patient Re-evaluated:Patient Re-evaluated prior to induction Oxygen Delivery Method: Circle system utilized Preoxygenation: Pre-oxygenation with 100% oxygen Induction Type: IV induction, Rapid sequence and Cricoid Pressure applied Laryngoscope Size: Mac and 3 Grade View: Grade I Tube type: Oral Tube size: 7.0 mm Number of attempts: 1 Airway Equipment and Method: Stylet Placement Confirmation: ETT inserted through vocal cords under direct vision,  positive ETCO2 and breath sounds checked- equal and bilateral Secured at: 19 cm Dental Injury: Teeth and Oropharynx as per pre-operative assessment

## 2016-11-14 NOTE — Anesthesia Preprocedure Evaluation (Deleted)
Anesthesia Evaluation    Airway        Dental   Pulmonary           Cardiovascular hypertension,      Neuro/Psych PSYCHIATRIC DISORDERS Anxiety Depression    GI/Hepatic hiatal hernia, GERD  ,  Endo/Other    Renal/GU Renal disease     Musculoskeletal  (+) Arthritis ,   Abdominal   Peds  Hematology   Anesthesia Other Findings   Reproductive/Obstetrics                             Anesthesia Physical Anesthesia Plan Anesthesia Quick Evaluation

## 2016-11-14 NOTE — Anesthesia Preprocedure Evaluation (Signed)
Anesthesia Evaluation  Patient identified by MRN, date of birth, ID band Patient awake    Reviewed: Allergy & Precautions, NPO status , Patient's Chart, lab work & pertinent test results  History of Anesthesia Complications Negative for: history of anesthetic complications  Airway Mallampati: II       Dental  (+) Dental Advidsory Given, Poor Dentition, Partial Upper, Missing   Pulmonary neg pulmonary ROS,           Cardiovascular hypertension, Pt. on medications      Neuro/Psych PSYCHIATRIC DISORDERS (Depression and currently altered) Anxiety Depression negative neurological ROS     GI/Hepatic hiatal hernia, GERD  Medicated and Controlled,  Endo/Other    Renal/GU ARFRenal disease (had b/l perc neph tubes placed)     Musculoskeletal   Abdominal   Peds  Hematology  (+) anemia ,   Anesthesia Other Findings Past Medical History: No date: Anemia No date: Anxiety No date: Arthritis No date: Cancer [facility])     Comment:  GIST No date: Depression No date: GERD (gastroesophageal reflux disease) No date: GIST (gastrointestinal stroma tumor), malignant, colon (HCC) No date: History of hiatal hernia No date: History of kidney stones No date: Hypertension     Comment:  NO MEDS NOW No date: IBS (irritable bowel syndrome) No date: Tremors of nervous system     Comment:  HEAD   Reproductive/Obstetrics negative OB ROS                             Anesthesia Physical  Anesthesia Plan  ASA: II  Anesthesia Plan: General   Post-op Pain Management:    Induction: Intravenous  PONV Risk Score and Plan: 3 and Ondansetron, Dexamethasone and Treatment may vary due to age or medical condition  Airway Management Planned: Oral ETT  Additional Equipment:   Intra-op Plan:   Post-operative Plan: Extubation in OR  Informed Consent: I have reviewed the patients History and Physical, chart, labs and  discussed the procedure including the risks, benefits and alternatives for the proposed anesthesia with the patient or authorized representative who has indicated his/her understanding and acceptance.   Dental Advisory Given  Plan Discussed with: CRNA, Anesthesiologist and Surgeon  Anesthesia Plan Comments:         Anesthesia Quick Evaluation

## 2016-11-14 NOTE — Anesthesia Postprocedure Evaluation (Signed)
Anesthesia Post Note  Patient: Courtney [name]  Procedure(s) Performed: Procedure(s) (LRB): cystoscopy with clot evacuation,fulguration of bleeders, and removal of bilateral stentsTUR bladder tumor (Bilateral)  Patient location during evaluation: PACU Anesthesia Type: General Level of consciousness: awake and alert Pain management: pain level controlled Vital Signs Assessment: post-procedure vital signs reviewed and stable Respiratory status: spontaneous breathing, nonlabored ventilation, respiratory function stable and patient connected to nasal cannula oxygen Cardiovascular status: blood pressure returned to baseline and stable Postop Assessment: no signs of nausea or vomiting Anesthetic complications: no     Last Vitals:  Vitals:   11/14/16 1506 11/14/16 1608  BP: (!) 105/53 (!) 121/44  Pulse: 96 97  Resp:    Temp: 36.4 C (!) 36.3 C  SpO2: 100% 100%    Last Pain:  Vitals:   11/14/16 1608  TempSrc: Oral  PainSc:                  Martha Clan

## 2016-11-15 ENCOUNTER — Encounter: Payer: Self-pay | Admitting: Urology

## 2016-11-15 DIAGNOSIS — Z9889 Other specified postprocedural states: Secondary | ICD-10-CM

## 2016-11-15 DIAGNOSIS — R310 Gross hematuria: Secondary | ICD-10-CM

## 2016-11-15 DIAGNOSIS — R590 Localized enlarged lymph nodes: Secondary | ICD-10-CM

## 2016-11-15 DIAGNOSIS — N132 Hydronephrosis with renal and ureteral calculous obstruction: Secondary | ICD-10-CM

## 2016-11-15 DIAGNOSIS — Z938 Other artificial opening status: Secondary | ICD-10-CM

## 2016-11-15 LAB — CBC
HCT: 21.2 % — ABNORMAL LOW (ref 35.0–47.0)
Hemoglobin: 7.3 g/dL — ABNORMAL LOW (ref 12.0–16.0)
MCH: 32.9 pg (ref 26.0–34.0)
MCHC: 34.4 g/dL (ref 32.0–36.0)
MCV: 95.7 fL (ref 80.0–100.0)
PLATELETS: 231 10*3/uL (ref 150–440)
RBC: 2.21 MIL/uL — AB (ref 3.80–5.20)
RDW: 17.2 % — ABNORMAL HIGH (ref 11.5–14.5)
WBC: 19.6 10*3/uL — AB (ref 3.6–11.0)

## 2016-11-15 LAB — BASIC METABOLIC PANEL
Anion gap: 9 (ref 5–15)
BUN: 39 mg/dL — ABNORMAL HIGH (ref 6–20)
CALCIUM: 7.7 mg/dL — AB (ref 8.9–10.3)
CO2: 20 mmol/L — ABNORMAL LOW (ref 22–32)
CREATININE: 1.27 mg/dL — AB (ref 0.44–1.00)
Chloride: 108 mmol/L (ref 101–111)
GFR, EST AFRICAN AMERICAN: 46 mL/min — AB (ref >60)
GFR, EST NON AFRICAN AMERICAN: 40 mL/min — AB (ref >60)
Glucose, Bld: 109 mg/dL — ABNORMAL HIGH (ref 65–99)
Potassium: 3 mmol/L — ABNORMAL LOW (ref 3.5–5.1)
SODIUM: 137 mmol/L (ref 135–145)

## 2016-11-15 LAB — MAGNESIUM: Magnesium: 1.8 mg/dL (ref 1.7–2.4)

## 2016-11-15 LAB — URINE CULTURE

## 2016-11-15 LAB — PREPARE RBC (CROSSMATCH)

## 2016-11-15 MED ORDER — POTASSIUM CHLORIDE CRYS ER 20 MEQ PO TBCR
40.0000 meq | EXTENDED_RELEASE_TABLET | Freq: Two times a day (BID) | ORAL | Status: AC
  Administered 2016-11-15 (×2): 40 meq via ORAL
  Filled 2016-11-15 (×2): qty 2

## 2016-11-15 MED ORDER — FUROSEMIDE 10 MG/ML IJ SOLN
20.0000 mg | Freq: Once | INTRAMUSCULAR | Status: AC
  Administered 2016-11-15: 20 mg via INTRAVENOUS
  Filled 2016-11-15: qty 4

## 2016-11-15 MED ORDER — SODIUM CHLORIDE 0.9 % IV SOLN
Freq: Once | INTRAVENOUS | Status: AC
  Administered 2016-11-15: 09:00:00 via INTRAVENOUS

## 2016-11-15 NOTE — Progress Notes (Signed)
[facility] NAME: Courtney [name]    MR#:  [id number]  DATE OF BIRTH:  1940-08-31  SUBJECTIVE:   The patient complains of bilateral flank pain.Marland Kitchen REVIEW OF SYSTEMS:    Review of Systems   Review of Systems  Constitutional: Negative for fever, chills weight loss, generalized weakness. HENT: Negative for ear pain, nosebleeds, congestion, facial swelling, rhinorrhea, neck pain, neck stiffness and ear discharge.   Respiratory: Negative for cough, shortness of breath, wheezing  Cardiovascular: Negative for chest pain, palpitations and leg swelling.  Gastrointestinal: Negative for heartburn, abdominal pain, vomiting, diarrhea or consitpation Genitourinary: Negative for dysuria, urgency, frequency, + hematuria, bilateral flank pain. Musculoskeletal: Negative for back pain or joint pain Neurological: Negative for dizziness, seizures, syncope, focal weakness,  numbness and headaches.  Hematological: Does not bruise/bleed easily.  Psychiatric/Behavioral: Negative for hallucinations, confusion, dysphoric mood    DRUG ALLERGIES:   Allergies  Allergen Reactions  . Other Swelling    Magic mouthwash  . Penicillins Swelling    Has patient had a PCN reaction causing immediate rash, facial/tongue/throat swelling, SOB or lightheadedness with hypotension: No Has patient had a PCN reaction causing severe rash involving mucus membranes or skin necrosis: No Has patient had a PCN reaction that required hospitalization: No Has patient had a PCN reaction occurring within the last 10 years: No If all of the above answers are "NO", then may proceed with Cephalosporin use.    . Ciprofloxacin Diarrhea    GI distress, abd cramping    VITALS:  Blood pressure (!) 156/58, pulse 88, temperature 98.6 F (37 C), temperature source Oral, resp. rate 18, height 4\' 11"  (1.499 m), weight 129 lb (58.5 kg), SpO2 99 %.  PHYSICAL EXAMINATION:  Constitutional: Appears  well-developed and well-nourished. No distress. looks pale. HENT: Normocephalic. Marland Kitchen Oropharynx is clear and moist.  Eyes: Conjunctivae and EOM are normal. PERRLA, no scleral icterus. Pale conjunctiva. Neck: Normal ROM. Neck supple. No JVD. No tracheal deviation. CVS: RRR, S1/S2 +, no murmurs, no gallops, no carotid bruit.  Pulmonary: Effort and breath sounds normal, no stridor, rhonchi, wheezes, rales.  Abdominal: Soft. BS +,  no distension, tenderness, rebound or guarding. Bloody drainage in bilateral nephrostomy tube bags. Musculoskeletal: Normal range of motion. No edema and no tenderness.  Neuro: patient is drowsy and sedated. Skin: Skin is warm and dry. No rash noted.  LABORATORY PANEL:   CBC  Recent Labs Lab 11/15/16 0409  WBC 19.6*  HGB 7.3*  HCT 21.2*  PLT 231   ------------------------------------------------------------------------------------------------------------------  Chemistries   Recent Labs Lab 11/15/16 0409  NA 137  K 3.0*  CL 108  CO2 20*  GLUCOSE 109*  BUN 39*  CREATININE 1.27*  CALCIUM 7.7*  MG 1.8   ------------------------------------------------------------------------------------------------------------------  Cardiac Enzymes No results for input(s): TROPONINI in the last 168 hours. ------------------------------------------------------------------------------------------------------------------  RADIOLOGY:  US Pelvis Limited  Result Date: 11/13/2016 CLINICAL DATA:  Hematuria. EXAM: LIMITED ULTRASOUND OF PELVIS TECHNIQUE: Limited transabdominal ultrasound examination of the pelvis was performed. COMPARISON:  GE CT from 11/13/2016 FINDINGS: The urinary bladder is distended by a large complicated heterogeneous mass corresponding to the high density material identified on CT from today. Other findings:  None. IMPRESSION: 1. Large complex heterogeneous mass within the urinary bladder compatible with bladder hematoma. Electronically Signed   By:  Kerby Moors M.D.   On: 11/13/2016 16:47   Ir Nephrostomy Placement Left  Result Date: 11/14/2016 INDICATION: 76 year old with metastatic cancer and bladder  involvement. Hydronephrosis and hematuria. Patient currently has bilateral ureter stents and plan for placement of bilateral nephrostomy tubes. EXAM: PLACEMENT OF LEFT PERCUTANEOUS NEPHROSTOMY TUBE WITH FLUOROSCOPY AND ULTRASOUND GUIDANCE PLACEMENT OF RIGHT PERCUTANEOUS NEPHROSTOMY TUBE WITH FLUOROSCOPY AND ULTRASOUND GUIDANCE COMPARISON:  None. MEDICATIONS: Clindamycin 300 mg; The antibiotic was administered in an appropriate time frame prior to skin puncture. ANESTHESIA/SEDATION: Fentanyl 75 mcg IV; Versed 3.0 mg IV Moderate Sedation Time:  70 minutes The patient was continuously monitored during the procedure by the interventional radiology nurse under my direct supervision. CONTRAST:  15 mL Isovue 300 - administered into the collecting system(s) FLUOROSCOPY TIME:  Fluoroscopy Time: 4 minutes 24 seconds (40 mGy). COMPLICATIONS: None immediate. PROCEDURE: Informed written consent was obtained from the patient after a thorough discussion of the procedural risks, benefits and alternatives. All questions were addressed. Maximal Sterile Barrier Technique was utilized including caps, mask, sterile gowns, sterile gloves, sterile drape, hand hygiene and skin antiseptic. A timeout was performed prior to the initiation of the procedure. Patient was placed prone. Both kidneys were identified with ultrasound. Both flanks were prepped and draped in sterile fashion. Maximal barrier sterile technique was utilized including caps, mask, sterile gowns, sterile gloves, sterile drape, hand hygiene and skin antiseptic. Left flank was anesthetized with 1% lidocaine. [place] needle was directed into left kidney lower pole calyx with ultrasound guidance. Percutaneous access in left kidney was difficult due to location of the kidney with relation to the ribs. Wire was  advanced into the renal collecting system. Accustick dilator set was placed. Contrast injection confirmed placement in the renal collecting system. A J wire was placed. The tract was dilated to accommodate a 10.2 [place] multipurpose drain. Drain was positioned in the renal pelvis. Additional contrast was injected to confirm placement. Catheter was sutured to the skin. Drain was placed to gravity bag. Attention directed to the right kidney. Skin was anesthetized with 1% lidocaine in the right flank. 22 gauge needle was advanced into a lower pole calyx with ultrasound guidance. Wire was advanced into the collecting system. Accustick dilator set was placed. 10.2 [place] multipurpose drain was placed over a J wire without difficulty. Drain was placed in the renal pelvic region. Contrast injection confirmed placement in the renal collecting system. Catheter sutured to skin and attached to a gravity bag. Fluoroscopic and ultrasound images were taken and saved for documentation. FINDINGS: Mild to moderate hydronephrosis bilaterally. Nephrostomy tubes are in the renal pelvis bilaterally. IMPRESSION: Successful placement of bilateral percutaneous nephrostomy tubes with ultrasound and fluoroscopic guidance. Electronically Signed   By: Markus Daft M.D.   On: 11/14/2016 11:25   Ir Nephrostomy Placement Right  Result Date: 11/14/2016 INDICATION: 76 year old with metastatic cancer and bladder involvement. Hydronephrosis and hematuria. Patient currently has bilateral ureter stents and plan for placement of bilateral nephrostomy tubes. EXAM: PLACEMENT OF LEFT PERCUTANEOUS NEPHROSTOMY TUBE WITH FLUOROSCOPY AND ULTRASOUND GUIDANCE PLACEMENT OF RIGHT PERCUTANEOUS NEPHROSTOMY TUBE WITH FLUOROSCOPY AND ULTRASOUND GUIDANCE COMPARISON:  None. MEDICATIONS: Clindamycin 300 mg; The antibiotic was administered in an appropriate time frame prior to skin puncture. ANESTHESIA/SEDATION: Fentanyl 75 mcg IV; Versed 3.0 mg IV Moderate Sedation  Time:  70 minutes The patient was continuously monitored during the procedure by the interventional radiology nurse under my direct supervision. CONTRAST:  15 mL Isovue 300 - administered into the collecting system(s) FLUOROSCOPY TIME:  Fluoroscopy Time: 4 minutes 24 seconds (40 mGy). COMPLICATIONS: None immediate. PROCEDURE: Informed written consent was obtained from the patient after a thorough discussion of  the procedural risks, benefits and alternatives. All questions were addressed. Maximal Sterile Barrier Technique was utilized including caps, mask, sterile gowns, sterile gloves, sterile drape, hand hygiene and skin antiseptic. A timeout was performed prior to the initiation of the procedure. Patient was placed prone. Both kidneys were identified with ultrasound. Both flanks were prepped and draped in sterile fashion. Maximal barrier sterile technique was utilized including caps, mask, sterile gowns, sterile gloves, sterile drape, hand hygiene and skin antiseptic. Left flank was anesthetized with 1% lidocaine. [place] needle was directed into left kidney lower pole calyx with ultrasound guidance. Percutaneous access in left kidney was difficult due to location of the kidney with relation to the ribs. Wire was advanced into the renal collecting system. Accustick dilator set was placed. Contrast injection confirmed placement in the renal collecting system. A J wire was placed. The tract was dilated to accommodate a 10.2 [place] multipurpose drain. Drain was positioned in the renal pelvis. Additional contrast was injected to confirm placement. Catheter was sutured to the skin. Drain was placed to gravity bag. Attention directed to the right kidney. Skin was anesthetized with 1% lidocaine in the right flank. 22 gauge needle was advanced into a lower pole calyx with ultrasound guidance. Wire was advanced into the collecting system. Accustick dilator set was placed. 10.2 [place] multipurpose drain was placed  over a J wire without difficulty. Drain was placed in the renal pelvic region. Contrast injection confirmed placement in the renal collecting system. Catheter sutured to skin and attached to a gravity bag. Fluoroscopic and ultrasound images were taken and saved for documentation. FINDINGS: Mild to moderate hydronephrosis bilaterally. Nephrostomy tubes are in the renal pelvis bilaterally. IMPRESSION: Successful placement of bilateral percutaneous nephrostomy tubes with ultrasound and fluoroscopic guidance. Electronically Signed   By: Markus Daft M.D.   On: 11/14/2016 11:25     ASSESSMENT AND PLAN:   [name] with history of progressive metastatic GIST dx 02/2014 and recent diagnosis of bladder tumor s/p TURBT and bilateral placement who presented with left leg pain and found to have DVT.   1. Left lower extremity DVT, nonocclusive in the common femoral vein and occlusive in the posterior tibial vein:due to underlying malignancy Due to anemia and possible need for Bilateral nephrostomy tubes, Eliquis was discontinuedand restarted heparin treatment. She can not have IVCF due to underlying cancer and location on IVC. Heparin drip was discontinued due to bladder hematoma,hematuria and decreased hemoglobin. May resume anticoagulation if  urologist agrees.  2. Acute on chronic blood loss anemia with recent diagnosis of bladder tumor status post TURBT: Status post 1 unit PRBC 8/19 and 1 unit on 8/23 and 1 unit on 8/4. Hemoglobin dropped to 7.3 today and got 1 unit of PRBC this am. Follow-up hemoglobin.  3.history of GISTT and bladder tumor:  Appreciate urology and oncology evaluation Diagnosis is still unknown. Pathology is pending. Patient had bilateral stents due to bilateral hydronephrosis, which is removed today. S/P bilateral nephrostomy tubes by interventional radiologist this am. S/p Cystoscopy with removal of bilateral double-J stents, clot evacuation with fulguration, and transurethral  resection of a large bladder tumor.  4.hypokalemia: K 3.0, given potassium supplement and follow-up BMP.  Hypomagnesemia. Give IV magnesium and improved.  5. Delirium:Resolved.  Acute renal failure. Possible due to obstruction secondary to bilateral hematoma. Improved after above procedures.  Continue IV fluid support and follow-up BMP tomorrow.  Hyperkalemia. The patient was given 1 dose Kayexalate, improved.  Physical therapy evaluation.  Management plans  discussed with nurse, the patient, her husband, son, brother, who are in agreement CODE STATUS: full  TOTAL TIME TAKING CARE OF THIS PATIENT: 38 minutes.   POSSIBLE D/C ?? DEPENDING ON CLINICAL CONDITION.   Demetrios Loll M.D on 11/15/2016 at 1:59 PM  Between 7am to 6pm - Pager - [phone] After 6pm go to [email or web address] - password [email or web address] [facility]  Office  [phone]  CC: Primary care physician; Rusty Aus, MD  Note: This dictation was prepared with Dragon dictation along with smaller phrase technology. Any transcriptional errors that result from this process are unintentional.

## 2016-11-15 NOTE — Progress Notes (Signed)
Consult received and chart reviewed Patient's issues are primarily urological. S C has improved Please re consult as necessary

## 2016-11-15 NOTE — Progress Notes (Addendum)
1 Day Post-Op Subjective: Patient reports she is feeling better.She is curious about the [facility] review of her pathology. She had chicken and once today and was pleased to be eating. Foley catheter is out.  Objective: Vital signs in last 24 hours: Temp:  [97.4 F (36.3 C)-98.6 F (37 C)] 98.6 F (37 C) (08/26 1300) Pulse Rate:  [88-96] 88 (08/26 1300) Resp:  [16-19] 18 (08/26 1300) BP: (122-156)/(48-58) 156/58 (08/26 1300) SpO2:  [99 %-100 %] 99 % (08/26 1300)  Intake/Output from previous day: 08/25 0701 - 08/26 0700 In: 8735 [I.V.:1685; IV Piggyback:50] Out: 6110 [Urine:6100; Blood:10] Intake/Output this shift: Total I/O In: 480 [P.O.:100; I.V.:10; Blood:370] Out: 1152 [Urine:1150; Stool:2]  Physical Exam:  She is in no acute distress Alert and oriented Abdomen is soft and nontender  bilateral nephrostomy tubes in place draining clear urine  Lab Results:  Recent Labs  11/13/16 1228 11/14/16 0345 11/15/16 0409  HGB 7.3* 9.0* 7.3*  HCT 21.2* 25.7* 21.2*   BMET  Recent Labs  11/14/16 0345 11/15/16 0409  NA 136 137  K 2.9* 3.0*  CL 106 108  CO2 21* 20*  GLUCOSE 102* 109*  BUN 58* 39*  CREATININE 1.90* 1.27*  CALCIUM 7.7* 7.7*    Recent Labs  11/14/16 0345  INR 1.25   No results for input(s): LABURIN in the last 72 hours. Results for orders placed or performed during the hospital encounter of 11/07/16  Urine culture     Status: None   Collection Time: 11/07/16 10:23 AM  Result Value Ref Range Status   Specimen Description URINE, RANDOM  Final   Special Requests NONE  Final   Culture   Final    NO GROWTH Performed at [facility], [place] [address] [place], [place] [address]    Report Status 11/08/2016 FINAL  Final  Urine Culture     Status: Abnormal   Collection Time: 11/12/16  6:00 PM  Result Value Ref Range Status   Specimen Description URINE, RANDOM  Final   Special Requests NONE  Final   Culture (A)  Final    >=100,000  COLONIES/mL STAPHYLOCOCCUS SPECIES (COAGULASE NEGATIVE)   Report Status 11/15/2016 FINAL  Final   Organism ID, Bacteria STAPHYLOCOCCUS SPECIES (COAGULASE NEGATIVE) (A)  Final      Susceptibility   Staphylococcus species (coagulase negative) - MIC*    CIPROFLOXACIN >=8 RESISTANT Resistant     GENTAMICIN <=0.5 SENSITIVE Sensitive     NITROFURANTOIN <=16 SENSITIVE Sensitive     OXACILLIN >=4 RESISTANT Resistant     TETRACYCLINE <=1 SENSITIVE Sensitive     VANCOMYCIN 2 SENSITIVE Sensitive     TRIMETH/SULFA <=10 SENSITIVE Sensitive     CLINDAMYCIN <=0.25 SENSITIVE Sensitive     RIFAMPIN <=0.5 SENSITIVE Sensitive     Inducible Clindamycin NEGATIVE Sensitive     * >=100,000 COLONIES/mL STAPHYLOCOCCUS SPECIES (COAGULASE NEGATIVE)    Studies/Results: Ir Nephrostomy Placement Left  Result Date: 11/14/2016 INDICATION: 75 year old with metastatic cancer and bladder involvement. Hydronephrosis and hematuria. Patient currently has bilateral ureter stents and plan for placement of bilateral nephrostomy tubes. EXAM: PLACEMENT OF LEFT PERCUTANEOUS NEPHROSTOMY TUBE WITH FLUOROSCOPY AND ULTRASOUND GUIDANCE PLACEMENT OF RIGHT PERCUTANEOUS NEPHROSTOMY TUBE WITH FLUOROSCOPY AND ULTRASOUND GUIDANCE COMPARISON:  None. MEDICATIONS: Clindamycin 300 mg; The antibiotic was administered in an appropriate time frame prior to skin puncture. ANESTHESIA/SEDATION: Fentanyl 75 mcg IV; Versed 3.0 mg IV Moderate Sedation Time:  70 minutes The patient was continuously monitored during the procedure by the  interventional radiology nurse under my direct supervision. CONTRAST:  15 mL Isovue 300 - administered into the collecting system(s) FLUOROSCOPY TIME:  Fluoroscopy Time: 4 minutes 24 seconds (40 mGy). COMPLICATIONS: None immediate. PROCEDURE: Informed written consent was obtained from the patient after a thorough discussion of the procedural risks, benefits and alternatives. All questions were addressed. Maximal Sterile Barrier  Technique was utilized including caps, mask, sterile gowns, sterile gloves, sterile drape, hand hygiene and skin antiseptic. A timeout was performed prior to the initiation of the procedure. Patient was placed prone. Both kidneys were identified with ultrasound. Both flanks were prepped and draped in sterile fashion. Maximal barrier sterile technique was utilized including caps, mask, sterile gowns, sterile gloves, sterile drape, hand hygiene and skin antiseptic. Left flank was anesthetized with 1% lidocaine. [place] needle was directed into left kidney lower pole calyx with ultrasound guidance. Percutaneous access in left kidney was difficult due to location of the kidney with relation to the ribs. Wire was advanced into the renal collecting system. Accustick dilator set was placed. Contrast injection confirmed placement in the renal collecting system. A J wire was placed. The tract was dilated to accommodate a 10.2 [place] multipurpose drain. Drain was positioned in the renal pelvis. Additional contrast was injected to confirm placement. Catheter was sutured to the skin. Drain was placed to gravity bag. Attention directed to the right kidney. Skin was anesthetized with 1% lidocaine in the right flank. 22 gauge needle was advanced into a lower pole calyx with ultrasound guidance. Wire was advanced into the collecting system. Accustick dilator set was placed. 10.2 [place] multipurpose drain was placed over a J wire without difficulty. Drain was placed in the renal pelvic region. Contrast injection confirmed placement in the renal collecting system. Catheter sutured to skin and attached to a gravity bag. Fluoroscopic and ultrasound images were taken and saved for documentation. FINDINGS: Mild to moderate hydronephrosis bilaterally. Nephrostomy tubes are in the renal pelvis bilaterally. IMPRESSION: Successful placement of bilateral percutaneous nephrostomy tubes with ultrasound and fluoroscopic guidance.  Electronically Signed   By: Markus Daft M.D.   On: 11/14/2016 11:25   Ir Nephrostomy Placement Right  Result Date: 11/14/2016 INDICATION: 76 year old with metastatic cancer and bladder involvement. Hydronephrosis and hematuria. Patient currently has bilateral ureter stents and plan for placement of bilateral nephrostomy tubes. EXAM: PLACEMENT OF LEFT PERCUTANEOUS NEPHROSTOMY TUBE WITH FLUOROSCOPY AND ULTRASOUND GUIDANCE PLACEMENT OF RIGHT PERCUTANEOUS NEPHROSTOMY TUBE WITH FLUOROSCOPY AND ULTRASOUND GUIDANCE COMPARISON:  None. MEDICATIONS: Clindamycin 300 mg; The antibiotic was administered in an appropriate time frame prior to skin puncture. ANESTHESIA/SEDATION: Fentanyl 75 mcg IV; Versed 3.0 mg IV Moderate Sedation Time:  70 minutes The patient was continuously monitored during the procedure by the interventional radiology nurse under my direct supervision. CONTRAST:  15 mL Isovue 300 - administered into the collecting system(s) FLUOROSCOPY TIME:  Fluoroscopy Time: 4 minutes 24 seconds (40 mGy). COMPLICATIONS: None immediate. PROCEDURE: Informed written consent was obtained from the patient after a thorough discussion of the procedural risks, benefits and alternatives. All questions were addressed. Maximal Sterile Barrier Technique was utilized including caps, mask, sterile gowns, sterile gloves, sterile drape, hand hygiene and skin antiseptic. A timeout was performed prior to the initiation of the procedure. Patient was placed prone. Both kidneys were identified with ultrasound. Both flanks were prepped and draped in sterile fashion. Maximal barrier sterile technique was utilized including caps, mask, sterile gowns, sterile gloves, sterile drape, hand hygiene and skin antiseptic. Left flank was anesthetized with 1%  lidocaine. [place] needle was directed into left kidney lower pole calyx with ultrasound guidance. Percutaneous access in left kidney was difficult due to location of the kidney with relation  to the ribs. Wire was advanced into the renal collecting system. Accustick dilator set was placed. Contrast injection confirmed placement in the renal collecting system. A J wire was placed. The tract was dilated to accommodate a 10.2 [place] multipurpose drain. Drain was positioned in the renal pelvis. Additional contrast was injected to confirm placement. Catheter was sutured to the skin. Drain was placed to gravity bag. Attention directed to the right kidney. Skin was anesthetized with 1% lidocaine in the right flank. 22 gauge needle was advanced into a lower pole calyx with ultrasound guidance. Wire was advanced into the collecting system. Accustick dilator set was placed. 10.2 [place] multipurpose drain was placed over a J wire without difficulty. Drain was placed in the renal pelvic region. Contrast injection confirmed placement in the renal collecting system. Catheter sutured to skin and attached to a gravity bag. Fluoroscopic and ultrasound images were taken and saved for documentation. FINDINGS: Mild to moderate hydronephrosis bilaterally. Nephrostomy tubes are in the renal pelvis bilaterally. IMPRESSION: Successful placement of bilateral percutaneous nephrostomy tubes with ultrasound and fluoroscopic guidance. Electronically Signed   By: Markus Daft M.D.   On: 11/14/2016 11:25    Assessment/Plan: -bladder cancer-Status post TURBT, stents August 13 with bilateral percutaneous nephrostomies, clot evacuation, residual TURBT and stent removal August 25. Foley out August 26. Path review pending by [facility]. -Bilateral hydronephrosis, AKI -status post bilateral percutaneous nephrostomy tubes, kidney function improving -Gross hematuria-resolved. Blood txfn today.   Will follow. I updated the patient's son after she called him on the cell phone.    LOS: 8 days   Daisean Brodhead 11/15/2016, 4:39 PM

## 2016-11-15 NOTE — Progress Notes (Signed)
Courtney [name]   DOB:Jul 28, 1940   VE#:[id number]    Subjective: Patient was in surgery for several hours yesterday.  She underwent bilateral percutaneous nephrostomy tube placement early yesterday morning.  She underwent cystoscopy with removal of ureteral catheters and clot evacuation.  She was discovered to have a > 5 cm residual tumor mass that was resected.  There was active bleeding from the tumor which was fulgurated with coagulation.  She was placed on continuous irrigation.  Foley catheter has subsequently been removed.  Review of system:  Patient notes lower abdominal pain and pain in back at site of urostomy tubes.  Feels frustrated as she is an active person who likes to do things.  Objective:  Vitals:   11/15/16 1020 11/15/16 1300  BP: (!) 135/54   Pulse: 91 (P) 88  Resp: 18 (P) 18  Temp: 98.3 F (36.8 C)   SpO2: 99%      Intake/Output Summary (Last 24 hours) at 11/15/16 1326 Last data filed at 11/15/16 1322  Gross per 24 hour  Intake             6715 ml  Output             6852 ml  Net             -137 ml    GENERAL:  Thin elderly woman lying comfortably on the medical unit in no acute distress. MENTAL STATUS:  Alert and oriented to person, place and time. HEAD:  Brown hair, slightly disheveled.  Normocephalic, atraumatic, face symmetric, no Cushingoid features. EYES:  Pupils equal round and reactive to light and accomodation.  No conjunctivitis or scleral icterus. ENT:  Oropharynx clear without lesion.  Tongue normal. Mucous membranes moist.  RESPIRATORY:  Clear to auscultation anteriorly without rales, wheezes or rhonchi. CARDIOVASCULAR:  Regular rate and rhythm without murmur, rub or gallop. ABDOMEN:  Soft, tender lower abdomen without guarding or rebound tenderness.  Active bowel sounds, and no hepatosplenomegaly. GU:  Bilateral external urostomy tubes.  Urine pink tinged (left > right). SKIN:  No rashes, ulcers or lesions. EXTREMITIES: Left lower extremity edema.  no  skin discoloration or tenderness.  No palpable cords. LYMPH NODES: No palpable cervical, supraclavicular, axillary or inguinal adenopathy  NEUROLOGICAL: Unremarkable. PSYCH:  Appropriate.    Labs:  Lab Results  Component Value Date   WBC 19.6 (H) 11/15/2016   HGB 7.3 (L) 11/15/2016   HCT 21.2 (L) 11/15/2016   MCV 95.7 11/15/2016   PLT 231 11/15/2016   NEUTROABS 6.3 11/07/2016    Lab Results  Component Value Date   NA 137 11/15/2016   K 3.0 (L) 11/15/2016   CL 108 11/15/2016   CO2 20 (L) 11/15/2016    Studies:  US Pelvis Limited  Result Date: 11/13/2016 CLINICAL DATA:  Hematuria. EXAM: LIMITED ULTRASOUND OF PELVIS TECHNIQUE: Limited transabdominal ultrasound examination of the pelvis was performed. COMPARISON:  GE CT from 11/13/2016 FINDINGS: The urinary bladder is distended by a large complicated heterogeneous mass corresponding to the high density material identified on CT from today. Other findings:  None. IMPRESSION: 1. Large complex heterogeneous mass within the urinary bladder compatible with bladder hematoma. Electronically Signed   By: Kerby Moors M.D.   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 Hydronephrosis and hematuria. Patient currently has bilateral ureter stents and plan for placement of bilateral nephrostomy tubes. EXAM: PLACEMENT OF LEFT PERCUTANEOUS NEPHROSTOMY  TUBE WITH FLUOROSCOPY AND ULTRASOUND GUIDANCE PLACEMENT OF RIGHT PERCUTANEOUS NEPHROSTOMY TUBE WITH FLUOROSCOPY AND ULTRASOUND GUIDANCE COMPARISON:  None. MEDICATIONS: Clindamycin 300 mg; The antibiotic was administered in an appropriate time frame prior to skin puncture. ANESTHESIA/SEDATION: Fentanyl 75 mcg IV; Versed 3.0 mg IV Moderate Sedation Time:  70 minutes The patient was continuously monitored during the procedure by the interventional radiology nurse under my direct supervision. CONTRAST:  15  mL Isovue 300 - administered into the collecting system(s) FLUOROSCOPY TIME:  Fluoroscopy Time: 4 minutes 24 seconds (40 mGy). COMPLICATIONS: None immediate. PROCEDURE: Informed written consent was obtained from the patient after a thorough discussion of the procedural risks, benefits and alternatives. All questions were addressed. Maximal Sterile Barrier Technique was utilized including caps, mask, sterile gowns, sterile gloves, sterile drape, hand hygiene and skin antiseptic. A timeout was performed prior to the initiation of the procedure. Patient was placed prone. Both kidneys were identified with ultrasound. Both flanks were prepped and draped in sterile fashion. Maximal barrier sterile technique was utilized including caps, mask, sterile gowns, sterile gloves, sterile drape, hand hygiene and skin antiseptic. Left flank was anesthetized with 1% lidocaine. [place] needle was directed into left kidney lower pole calyx with ultrasound guidance. Percutaneous access in left kidney was difficult due to location of the kidney with relation to the ribs. Wire was advanced into the renal collecting system. Accustick dilator set was placed. Contrast injection confirmed placement in the renal collecting system. A J wire was placed. The tract was dilated to accommodate a 10.2 [place] multipurpose drain. Drain was positioned in the renal pelvis. Additional contrast was injected to confirm placement. Catheter was sutured to the skin. Drain was placed to gravity bag. Attention directed to the right kidney. Skin was anesthetized with 1% lidocaine in the right flank. 22 gauge needle was advanced into a lower pole calyx with ultrasound guidance. Wire was advanced into the collecting system. Accustick dilator set was placed. 10.2 [place] multipurpose drain was placed over a J wire without difficulty. Drain was placed in the renal pelvic region. Contrast injection confirmed placement in the renal collecting system. Catheter  sutured to skin and attached to a gravity bag. Fluoroscopic and ultrasound images were taken and saved for documentation. FINDINGS: Mild to moderate hydronephrosis bilaterally. Nephrostomy tubes are in the renal pelvis bilaterally. IMPRESSION: Successful placement of bilateral percutaneous nephrostomy tubes with ultrasound and fluoroscopic guidance. Electronically Signed   By: Markus Daft M.D.   On: 11/14/2016 11:25   Ir Nephrostomy Placement Right  Result Date: 11/14/2016 INDICATION: 76 year old with metastatic cancer and bladder involvement. Hydronephrosis and hematuria. Patient currently has bilateral ureter stents and plan for placement of bilateral nephrostomy tubes. EXAM: PLACEMENT OF LEFT PERCUTANEOUS NEPHROSTOMY TUBE WITH FLUOROSCOPY AND ULTRASOUND GUIDANCE PLACEMENT OF RIGHT PERCUTANEOUS NEPHROSTOMY TUBE WITH FLUOROSCOPY AND ULTRASOUND GUIDANCE COMPARISON:  None. MEDICATIONS: Clindamycin 300 mg; The antibiotic was administered in an appropriate time frame prior to skin puncture. ANESTHESIA/SEDATION: Fentanyl 75 mcg IV; Versed 3.0 mg IV Moderate Sedation Time:  70 minutes The patient was continuously monitored during the procedure by the interventional radiology nurse under my direct supervision. CONTRAST:  15 mL Isovue 300 - administered into the collecting system(s) FLUOROSCOPY TIME:  Fluoroscopy Time: 4 minutes 24 seconds (40 mGy). COMPLICATIONS: None immediate. PROCEDURE: Informed written consent was obtained from the patient after a thorough discussion of the procedural risks, benefits and alternatives. All questions were addressed. Maximal Sterile Barrier Technique was utilized including caps, mask, sterile gowns, sterile gloves, sterile  drape, hand hygiene and skin antiseptic. A timeout was performed prior to the initiation of the procedure. Patient was placed prone. Both kidneys were identified with ultrasound. Both flanks were prepped and draped in sterile fashion. Maximal barrier sterile  technique was utilized including caps, mask, sterile gowns, sterile gloves, sterile drape, hand hygiene and skin antiseptic. Left flank was anesthetized with 1% lidocaine. [place] needle was directed into left kidney lower pole calyx with ultrasound guidance. Percutaneous access in left kidney was difficult due to location of the kidney with relation to the ribs. Wire was advanced into the renal collecting system. Accustick dilator set was placed. Contrast injection confirmed placement in the renal collecting system. A J wire was placed. The tract was dilated to accommodate a 10.2 [place] multipurpose drain. Drain was positioned in the renal pelvis. Additional contrast was injected to confirm placement. Catheter was sutured to the skin. Drain was placed to gravity bag. Attention directed to the right kidney. Skin was anesthetized with 1% lidocaine in the right flank. 22 gauge needle was advanced into a lower pole calyx with ultrasound guidance. Wire was advanced into the collecting system. Accustick dilator set was placed. 10.2 [place] multipurpose drain was placed over a J wire without difficulty. Drain was placed in the renal pelvic region. Contrast injection confirmed placement in the renal collecting system. Catheter sutured to skin and attached to a gravity bag. Fluoroscopic and ultrasound images were taken and saved for documentation. FINDINGS: Mild to moderate hydronephrosis bilaterally. Nephrostomy tubes are in the renal pelvis bilaterally. IMPRESSION: Successful placement of bilateral percutaneous nephrostomy tubes with ultrasound and fluoroscopic guidance. Electronically Signed   By: Markus Daft M.D.   On: 11/14/2016 11:25    Assessment & Plan:   # 76 year old female patient with history of GIST tumor abdomen/pelvis-currently in the hospital for left lower extremity DVT  # Left lower extremity DVT- bleeding noted on anticoagulation with heparin. Currently on hold. Patient not a candidate for IVC  filter secondary to retroperitoneal adenopathy.  Anticipate restart of heparin once cleared by urology.  # Hematuria- status post stenting for bilateral hydronephrosis then removal on 11/14/2016.  Percutaneous nephrostomy tubes placement, clot evacuation from bladder, and tumor removal yesterday.  Active sites of bleeding coagulated.  Bladder irrigation then Foley removal this morning.  Urine is slightly pink tinged.  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 Preliminary pathology on TURBT/cystoscopy positive for "high-grade malignancy-morphologically appears different from her previous Gist tumor".  Suspicion for recurrence of her previous GIST tumor.  Awaiting pathology review at [facility]. Anticipate initiation of Regorafenib once acute issues resolve.   # Severe anemia secondary to hematuria/anticoagulation- hemoglobin 7.3 today.  Patient received 1 unit of PRBCs this morning.  CBC in AM.  Maintain active type and screen.  # Code status:  Patient does not want to be intubated and on a ventilator.  She does not want to undergo chest compressions or cardioversion if she will not improve.  I stated that it is too early to determine her response to planned treatment.  She will discuss further with Dr. Rogue Bussing on 11/16/2016.  Her sister and her nurse were present during the conversation.  Continue current FULL code status.   Lequita Asal, MD 11/15/2016  1:26 PM

## 2016-11-16 ENCOUNTER — Telehealth: Payer: Self-pay | Admitting: Pharmacist

## 2016-11-16 DIAGNOSIS — N1339 Other hydronephrosis: Secondary | ICD-10-CM

## 2016-11-16 DIAGNOSIS — C269 Malignant neoplasm of ill-defined sites within the digestive system: Secondary | ICD-10-CM

## 2016-11-16 DIAGNOSIS — Z436 Encounter for attention to other artificial openings of urinary tract: Secondary | ICD-10-CM

## 2016-11-16 DIAGNOSIS — D500 Iron deficiency anemia secondary to blood loss (chronic): Secondary | ICD-10-CM

## 2016-11-16 LAB — CBC
HCT: 27.3 % — ABNORMAL LOW (ref 35.0–47.0)
HEMOGLOBIN: 9.5 g/dL — AB (ref 12.0–16.0)
MCH: 32.2 pg (ref 26.0–34.0)
MCHC: 34.9 g/dL (ref 32.0–36.0)
MCV: 92.2 fL (ref 80.0–100.0)
Platelets: 272 10*3/uL (ref 150–440)
RBC: 2.96 MIL/uL — AB (ref 3.80–5.20)
RDW: 17.7 % — ABNORMAL HIGH (ref 11.5–14.5)
WBC: 16.5 10*3/uL — ABNORMAL HIGH (ref 3.6–11.0)

## 2016-11-16 LAB — TYPE AND SCREEN
ABO/RH(D): A POS
ABO/RH(D): A POS
ANTIBODY SCREEN: NEGATIVE
Antibody Screen: NEGATIVE
UNIT DIVISION: 0
UNIT DIVISION: 0
Unit division: 0

## 2016-11-16 LAB — BASIC METABOLIC PANEL
ANION GAP: 8 (ref 5–15)
BUN: 31 mg/dL — ABNORMAL HIGH (ref 6–20)
CALCIUM: 7.7 mg/dL — AB (ref 8.9–10.3)
CO2: 20 mmol/L — ABNORMAL LOW (ref 22–32)
Chloride: 107 mmol/L (ref 101–111)
Creatinine, Ser: 1.13 mg/dL — ABNORMAL HIGH (ref 0.44–1.00)
GFR, EST AFRICAN AMERICAN: 53 mL/min — AB (ref >60)
GFR, EST NON AFRICAN AMERICAN: 46 mL/min — AB (ref >60)
Glucose, Bld: 107 mg/dL — ABNORMAL HIGH (ref 65–99)
Potassium: 3.5 mmol/L (ref 3.5–5.1)
SODIUM: 135 mmol/L (ref 135–145)

## 2016-11-16 LAB — BPAM RBC
BLOOD PRODUCT EXPIRATION DATE: 201808282359
BLOOD PRODUCT EXPIRATION DATE: 201809062359
BLOOD PRODUCT EXPIRATION DATE: 201809152359
ISSUE DATE / TIME: 201808231152
ISSUE DATE / TIME: 201808241804
ISSUE DATE / TIME: 201808260941
UNIT TYPE AND RH: 6200
UNIT TYPE AND RH: 9500
Unit Type and Rh: 6200

## 2016-11-16 MED ORDER — REGORAFENIB 40 MG PO TABS: ORAL_TABLET | ORAL | 4 refills | Status: DC

## 2016-11-16 MED ORDER — APIXABAN 5 MG PO TABS
10.0000 mg | ORAL_TABLET | Freq: Two times a day (BID) | ORAL | Status: DC
  Administered 2016-11-16: 10 mg via ORAL
  Filled 2016-11-16: qty 2

## 2016-11-16 MED ORDER — APIXABAN 5 MG PO TABS: 5.0000 mg | ORAL_TABLET | Freq: Two times a day (BID) | ORAL | Status: DC

## 2016-11-16 MED ORDER — APIXABAN 5 MG PO TABS
5.0000 mg | ORAL_TABLET | Freq: Two times a day (BID) | ORAL | Status: DC
  Administered 2016-11-16 – 2016-11-18 (×4): 5 mg via ORAL
  Filled 2016-11-16 (×4): qty 1

## 2016-11-16 NOTE — Progress Notes (Signed)
[employer]

## 2016-11-16 NOTE — Care Management Important Message (Signed)
Important Message  Patient Details  Name: Courtney [name] MRN: [id number] Date of Birth: 03-30-1940   Medicare Important Message Given:  Yes    Shelbie Ammons, RN 11/16/2016, 9:26 AM

## 2016-11-16 NOTE — Progress Notes (Signed)
Courtney [name]   DOB:[birth year]   JS#:[id number]    Subjective: Patient had repeat cystoscopy over the weekend; noted to have approximately 5 cm tumor in the bladder that was resected. She had the stents taken out. She also had percutaneous nephrostomy tubes placed. Anticoagulation continues to be on hold.  Patient denies any blood in the nephrostomy tubes. Complains of pain at the site of the tubes. She is anxious/eager to go home.  Review of system:  Denies any chest pain or shortness of breath.  Objective:  Vitals:   11/16/16 0502 11/16/16 1242  BP: (!) 122/54 (!) 146/63  Pulse: 96 (!) 106  Resp: 18   Temp: 98 F (36.7 C)   SpO2: 98% 99%     Intake/Output Summary (Last 24 hours) at 11/16/16 1317 Last data filed at 11/16/16 1200  Gross per 24 hour  Intake             1833 ml  Output              926 ml  Net              907 ml    GENERAL: Well-nourished well-developed; Thin built.. Alert, no distress and comfortable. Accompanied by her brother/her husband  EYES: Positive for pallor. OROPHARYNX: no thrush or ulceration. NECK: supple, no masses felt LYMPH:  no palpable lymphadenopathy in the cervical, axillary or inguinal regions LUNGS: decreased breath sounds to auscultation at bases and  No wheeze or crackles HEART/CVS: regular rate & rhythm and no murmurs; swelling/ pain of Left LE.  ABDOMEN: abdomen soft, non-tender and normal bowel sounds;Bilateral percutaneous nephrostomy tubes. Musculoskeletal:no cyanosis of digits and no clubbing  PSYCH: alert & oriented x 3  NEURO: no focal motor/sensory deficits SKIN:  no rashes or significant lesions   Labs:  Lab Results  Component Value Date   WBC 16.5 (H) 11/16/2016   HGB 9.5 (L) 11/16/2016   HCT 27.3 (L) 11/16/2016   MCV 92.2 11/16/2016   PLT 272 11/16/2016   NEUTROABS 6.3 11/07/2016    Lab Results  Component Value Date   NA 135 11/16/2016   K 3.5 11/16/2016   CL 107 11/16/2016   CO2 20 (L) 11/16/2016    Studies:   No results found.  Assessment & Plan:   # 76 year old female patient with history of Gist tumor abdomen/pelvis-currently in the hospital for left lower extremity DVT  # Left lower extremity DVT- bleeding noted on anticoagulation with heparin. Currently on hold. Hemoglobin stable. Restart anticoagulation- start with Eliquis 5 mg twice a day [given the severe bleeding that pt had on anti-coagulation]  # Hematuria- status post resection of the bladder tumor/cystoscopy; and also removal of the stents. Currently has percutaneous nephrostomy tubes. No active bleeding noted in the bag.   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 Preliminary pathology on TURBT/cystoscopy positive for "high-grade malignancy-morphologically appears different from her previous Gist tumor". However, I clinically suspect this is recurrence of her previous Gist tumor. Awaiting pathology review at [facility]. Prescription for Regorafenib initiated.   # Severe anemia secondary to hematuria/anticoagulation- hemoglobin 9.5 status post transfusion; awaiting on repeat hemoglobin. Monitor hemoglobin closely on anti-coagulation.   Spoke to pt's husband/brother-by the bedside. Also spoke to patient's son Mel Almond on the phone at [phone].  Cammie Sickle, MD 11/16/2016  1:17 PM

## 2016-11-16 NOTE — Progress Notes (Signed)
2 Days Post-Op Subjective: C/o pain at the nephrostomy tube insertion site. Clear yellow urine draining from both. Anxious for discharge.  Objective: Vital signs in last 24 hours: Temp:  [98 F (36.7 C)-98.7 F (37.1 C)] 98 F (36.7 C) (08/27 0502) Pulse Rate:  [96-106] 106 (08/27 1242) Resp:  [18-20] 18 (08/27 0502) BP: (122-146)/(54-70) 146/63 (08/27 1242) SpO2:  [98 %-99 %] 99 % (08/27 1242)  Intake/Output from previous day: 08/26 0701 - 08/27 0700 In: 3335 [P.O.:150; I.V.:1271; Blood:370] Out: 1877 [Urine:1875; Stool:2] Intake/Output this shift: Total I/O In: 522 [I.V.:522] Out: 200 [Urine:200]  Physical Exam:  She is in no acute distress- sister at bedside Alert and oriented x 2, not sure of day of week Abdomen is soft and nontender  bilateral nephrostomy tubes in place draining clear urine  Lab Results:  Recent Labs  11/14/16 0345 11/15/16 0409 11/16/16 0345  HGB 9.0* 7.3* 9.5*  HCT 25.7* 21.2* 27.3*   BMET  Recent Labs  11/15/16 0409 11/16/16 0345  NA 137 135  K 3.0* 3.5  CL 108 107  CO2 20* 20*  GLUCOSE 109* 107*  BUN 39* 31*  CREATININE 1.27* 1.13*  CALCIUM 7.7* 7.7*    Recent Labs  11/14/16 0345  INR 1.25   No results for input(s): LABURIN in the last 72 hours. Results for orders placed or performed during the hospital encounter of 11/07/16  Urine culture     Status: None   Collection Time: 11/07/16 10:23 AM  Result Value Ref Range Status   Specimen Description URINE, RANDOM  Final   Special Requests NONE  Final   Culture   Final    NO GROWTH Performed at [facility], [place] [address] [place], [place] [address]    Report Status 11/08/2016 FINAL  Final  Urine Culture     Status: Abnormal   Collection Time: 11/12/16  6:00 PM  Result Value Ref Range Status   Specimen Description URINE, RANDOM  Final   Special Requests NONE  Final   Culture (A)  Final    >=100,000 COLONIES/mL STAPHYLOCOCCUS SPECIES (COAGULASE NEGATIVE)   Report Status 11/15/2016 FINAL  Final   Organism ID, Bacteria STAPHYLOCOCCUS SPECIES (COAGULASE NEGATIVE) (A)  Final      Susceptibility   Staphylococcus species (coagulase negative) - MIC*    CIPROFLOXACIN >=8 RESISTANT Resistant     GENTAMICIN <=0.5 SENSITIVE Sensitive     NITROFURANTOIN <=16 SENSITIVE Sensitive     OXACILLIN >=4 RESISTANT Resistant     TETRACYCLINE <=1 SENSITIVE Sensitive     VANCOMYCIN 2 SENSITIVE Sensitive     TRIMETH/SULFA <=10 SENSITIVE Sensitive     CLINDAMYCIN <=0.25 SENSITIVE Sensitive     RIFAMPIN <=0.5 SENSITIVE Sensitive     Inducible Clindamycin NEGATIVE Sensitive     * >=100,000 COLONIES/mL STAPHYLOCOCCUS SPECIES (COAGULASE NEGATIVE)    Studies/Results: No results found.  Assessment/Plan: -bladder cancer-Status post TURBT, stents August 13 with bilateral percutaneous nephrostomies, clot evacuation, residual TURBT and stent removal August 25. Foley out August 26. Path review pending by [facility]. -Bilateral hydronephrosis, AKI -status post bilateral percutaneous nephrostomy tubes, kidney function improving -Gross hematuria-resolved. OK to resume anticoagulation.      LOS: 9 days   Hollice Espy 11/16/2016, 3:19 PM

## 2016-11-16 NOTE — Telephone Encounter (Signed)
Oral Oncology Pharmacist Encounter  Received new prescription for Regorafenib (Stivarga) for the treatment of GIST, planned duration until disease progression or unacceptable drug toxicity.  CMP from 11/07/16 and CBC/BMP/BP from today 11/16/16 assessed, no barriers to patient starting medication.  Current medication list in Epic reviewed, no DDIs with regorafenib identified.  Prescription has been e-scribed to the [facility] and will require a PA  Oral Oncology Clinic will continue to follow for insurance authorization, copayment issues, initial counseling and start date.  Darl Pikes, PharmD, BCPS Hematology/Oncology Clinical Pharmacist [facility] [phone]  11/16/2016 10:24 AM

## 2016-11-16 NOTE — Evaluation (Signed)
Physical Therapy Evaluation Patient Details Name: Courtney [name] MRN: [id number] DOB: April 13, 1940 Today's Date: 11/16/2016   History of Present Illness  presented to ER secondary to L LE pain; admitted with L LE DVT (non-occlusive to common femoral vein, occlusive to posterior tibial vein).  Admitted for hospitalization for management of anticoagulation.  Patient with history of GIST abdominal tumor, bladder tumor; status post ureteral stent (8/13).  Hospital stay complicated by bladder hematoma requiring CBI and intermittent agitation/confusion.  Now status post bilat neph tubes 8/25.  Clinical Impression  Patient agreeable to re-evaluation, now status post bilat neph tubes 8/25.  Reports pain in R/L flank, 4-5/10; meds received during session.  Requiring constant cuing/encouragement for participation with session this date.  Very limited willingness to initiate mobility without physical assist from therapist.  Currently requiring min assist for bed mobility and sit/stand (with L HHA); tolerating static stance 5-7 seconds prior to spontaneously sitting with each trial (despite cuing from therapist and nursing in room).  Fair balance noted, but unwilling to participate with more in-depth assessment.  Refused gait efforts.   Would benefit from skilled PT to address above deficits and promote optimal return to PLOF; recommend transition to STR upon discharge from acute hospitalization.  Will continue to assess mobility and update recommendations at this time.      Follow Up Recommendations SNF (will continue to assess/update pending additional mobility assessment)    Equipment Recommendations  Rolling walker with 5" wheels    Recommendations for Other Services       Precautions / Restrictions Precautions Precautions: Fall Precaution Comments: bilat neph tubes Restrictions Weight Bearing Restrictions: No      Mobility  Bed Mobility Overal bed mobility: Needs Assistance Bed Mobility: Supine  to Sit;Sit to Supine     Supine to sit: Min assist Sit to supine: Min assist      Transfers Overall transfer level: Needs assistance Equipment used: 1 person hand held assist Transfers: Sit to/from Stand Sit to Stand: Min assist         General transfer comment: cuing for task initiation with each repetition; spontaneously sitting with each trial despite cuing  Ambulation/Gait             General Gait Details: patient refused trial at this time due to pain, fatigue  Stairs            Wheelchair Mobility    Modified Rankin (Stroke Patients Only)       Balance Overall balance assessment: Needs assistance Sitting-balance support: No upper extremity supported;Feet supported Sitting balance-Leahy Scale: Good     Standing balance support: Single extremity supported Standing balance-Leahy Scale: Fair                               Pertinent Vitals/Pain Pain Assessment: Faces Faces Pain Scale: Hurts little more Pain Location: R/L flank Pain Descriptors / Indicators: Aching;Grimacing;Guarding Pain Intervention(s): Limited activity within patient's tolerance;Monitored during session;Patient requesting pain meds-RN notified;RN gave pain meds during session    Home Living Family/patient expects to be discharged to:: Private residence Living Arrangements: Spouse/significant other;Other relatives Available Help at Discharge: Family   Home Access: Stairs to enter Entrance Stairs-Rails: None Entrance Stairs-Number of Steps: 2 Home Layout: One level Home Equipment: None      Prior Function Level of Independence: Independent         Comments: Indep with ADLs, household and community mobility; denies fall history.  Hand Dominance        Extremity/Trunk Assessment   Upper Extremity Assessment Upper Extremity Assessment: Overall WFL for tasks assessed    Lower Extremity Assessment Lower Extremity Assessment: Generalized weakness  (grossly 4-/5 throughout)       Communication   Communication: No difficulties  Cognition Arousal/Alertness: Awake/alert Behavior During Therapy: Impulsive Overall Cognitive Status: Impaired/Different from baseline                                 General Comments: oriented to self, basic location/situation; poor insight/awareness of situation, details and need for assist      General Comments      Exercises Other Exercises Other Exercises: Multiple sit/stand with L HHA, min assist; constatn cuing/encouragement for participation.  Did attempt single step forward/backward, but refused additional gait attempts.  Spontaneously sitting after each standing trial; may benefit from chair follow with gait efforts to ensure patient does not sit mid-distance.  Very difficult to redirect.   Assessment/Plan    PT Assessment Patent does not need any further PT services  PT Problem List Decreased strength;Decreased activity tolerance;Decreased balance;Decreased mobility;Decreased cognition;Decreased coordination;Decreased knowledge of use of DME;Decreased safety awareness;Decreased knowledge of precautions;Decreased skin integrity;Pain       PT Treatment Interventions DME instruction;Gait training;Functional mobility training;Stair training;Therapeutic activities;Therapeutic exercise;Balance training;Patient/family education    PT Goals (Current goals can be found in the Care Plan section)  Acute Rehab PT Goals Patient Stated Goal: I have been here 18 days; I need to get out of here PT Goal Formulation: With patient Time For Goal Achievement: 11/30/16 Potential to Achieve Goals: Fair    Frequency Min 2X/week   Barriers to discharge        Co-evaluation               AM-PAC PT "6 Clicks" Daily Activity  Outcome Measure Difficulty turning over in bed (including adjusting bedclothes, sheets and blankets)?: Unable Difficulty moving from lying on back to sitting on the  side of the bed? : Unable Difficulty sitting down on and standing up from a chair with arms (e.g., wheelchair, bedside commode, etc,.)?: Unable Help needed moving to and from a bed to chair (including a wheelchair)?: A Little Help needed walking in hospital room?: A Little Help needed climbing 3-5 steps with a railing? : A Lot 6 Click Score: 11    End of Session Equipment Utilized During Treatment: Gait belt Activity Tolerance: Patient tolerated treatment well Patient left: with nursing/sitter in room;with call bell/phone within reach;in bed;with family/visitor present Nurse Communication: Mobility status PT Visit Diagnosis: Muscle weakness (generalized) (M62.81);Difficulty in walking, not elsewhere classified (R26.2)    Time: 6948-5462 PT Time Calculation (min) (ACUTE ONLY): 24 min   Charges:   PT Evaluation $PT Re-evaluation: 1 Re-eval PT Treatments $Therapeutic Activity: 8-22 mins   PT G Codes:   PT G-Codes **NOT FOR INPATIENT CLASS** Functional Assessment Tool Used: AM-PAC 6 Clicks Basic Mobility Functional Limitation: Mobility: Walking and moving around Mobility: Walking and Moving Around Current Status (V0350): At least 40 percent but less than 60 percent impaired, limited or restricted Mobility: Walking and Moving Around Goal Status [phone]): At least 1 percent but less than 20 percent impaired, limited or restricted    Anishka Bushard H. Owens Shark, PT, DPT, NCS 11/16/16, 11:33 AM [phone]

## 2016-11-16 NOTE — Progress Notes (Addendum)
ANTICOAGULATION CONSULT NOTE- Initial   Pharmacy Consult for Apixaban  Indication: DVT treatment  Allergies  Allergen Reactions  . Other Swelling    Magic mouthwash  . Penicillins Swelling    Has patient had a PCN reaction causing immediate rash, facial/tongue/throat swelling, SOB or lightheadedness with hypotension: No Has patient had a PCN reaction causing severe rash involving mucus membranes or skin necrosis: No Has patient had a PCN reaction that required hospitalization: No Has patient had a PCN reaction occurring within the last 10 years: No If all of the above answers are "NO", then may proceed with Cephalosporin use.    . Ciprofloxacin Diarrhea    GI distress, abd cramping    Patient Measurements: Height: 4\' 11"  (149.9 cm) Weight: 129 lb (58.5 kg) IBW/kg (Calculated) : 43.2 Heparin Dosing Weight: 55 kg  Vital Signs: Temp: 98 F (36.7 C) (08/27 0502) Temp Source: Oral (08/27 0502) BP: 122/54 (08/27 0502) Pulse Rate: 96 (08/27 0502)  Labs:  Recent Labs  11/13/16 1050  11/14/16 0345 11/15/16 0409 11/16/16 0345  HGB  --   < > 9.0* 7.3* 9.5*  HCT  --   < > 25.7* 21.2* 27.3*  PLT  --   < > 245 231 272  APTT 97*  --  31  --   --   LABPROT  --   --  15.8*  --   --   INR  --   --  1.25  --   --   CREATININE  --   < > 1.90* 1.27* 1.13*  < > = values in this interval not displayed.  Estimated Creatinine Clearance: 32.4 mL/min (A) (by C-G formula based on SCr of 1.13 mg/dL (H)).   Medical History: Past Medical History:  Diagnosis Date  . Anemia   . Anxiety   . Arthritis   . Cancer [facility])    GIST  . Depression   . GERD (gastroesophageal reflux disease)   . GIST (gastrointestinal stroma tumor), malignant, colon ([place])   . History of hiatal hernia   . History of kidney stones   . Hypertension    NO MEDS NOW  . IBS (irritable bowel syndrome)   . Tremors of nervous system    HEAD    Assessment: 76 y/o F admitted with DVT to start on apixaban. Hx:  bladder tumor status post TURBT, She can not have IVCF due to underlying cancer and location on IVC. Heparin drip was d/ced 11/13/16 due to bladder hematoma,hematuria and decreased hemoglobin.  Goal of Therapy:  Monitor platelets by anticoagulation protocol: Yes   Plan:  Will begin apixaban 10 mg twice daily x 7 days followed by apixaban 5 mg bid.  hgb 9.5,  Plt 272 Continue to monitor H&H and platelets.  Addendum 8/27: Per Dr. Rogue Bussing (Hematology/Onc): REstart Apixaban at 5 mg BID [given the severe bleeding that pt had on anti-coagulation].   Chinita Greenland PharmD Clinical Pharmacist 11/16/2016

## 2016-11-16 NOTE — Progress Notes (Signed)
[place] [facility] [place] NAME: Courtney [name]    MR#:  [id number]  DATE OF BIRTH:  23-Nov-1940  SUBJECTIVE:   The patient has generalized weakness, bilateral nephrostomy tube drainage is clear REVIEW OF SYSTEMS:    Review of Systems   Review of Systems  Constitutional: Negative for fever, chills weight loss, generalized weakness. HENT: Negative for ear pain, nosebleeds, congestion, facial swelling, rhinorrhea, neck pain, neck stiffness and ear discharge.   Respiratory: Negative for cough, shortness of breath, wheezing  Cardiovascular: Negative for chest pain, palpitations and leg swelling.  Gastrointestinal: Negative for heartburn, abdominal pain, vomiting, diarrhea or consitpation Genitourinary: Negative for dysuria, urgency, frequency, no hematuria, bilateral flank pain. Musculoskeletal: Negative for back pain or joint pain Neurological: Negative for dizziness, seizures, syncope, focal weakness,  numbness and headaches.  Hematological: Does not bruise/bleed easily.  Psychiatric/Behavioral: Negative for hallucinations, confusion, dysphoric mood    DRUG ALLERGIES:   Allergies  Allergen Reactions  . Other Swelling    Magic mouthwash  . Penicillins Swelling    Has patient had a PCN reaction causing immediate rash, facial/tongue/throat swelling, SOB or lightheadedness with hypotension: No Has patient had a PCN reaction causing severe rash involving mucus membranes or skin necrosis: No Has patient had a PCN reaction that required hospitalization: No Has patient had a PCN reaction occurring within the last 10 years: No If all of the above answers are "NO", then may proceed with Cephalosporin use.    . Ciprofloxacin Diarrhea    GI distress, abd cramping    VITALS:  Blood pressure (!) 146/63, pulse (!) 106, temperature 98 F (36.7 C), temperature source Oral, resp. rate 18, height 4\' 11"  (1.499 m), weight 129 lb (58.5 kg), SpO2 99 %.  PHYSICAL  EXAMINATION:  Constitutional: Appears well-developed and well-nourished. No distress. looks pale. HENT: Normocephalic. Marland Kitchen Oropharynx is clear and moist.  Eyes: Conjunctivae and EOM are normal. PERRLA, no scleral icterus. Pale conjunctiva. Neck: Normal ROM. Neck supple. No JVD. No tracheal deviation. CVS: RRR, S1/S2 +, no murmurs, no gallops, no carotid bruit.  Pulmonary: Effort and breath sounds normal, no stridor, rhonchi, wheezes, rales.  Abdominal: Soft. BS +,  no distension, tenderness, rebound or guarding. Clear drainage in bilateral nephrostomy tube bags. Musculoskeletal: Normal range of motion. No edema and no tenderness.  Neuro: patient is drowsy and sedated. Skin: Skin is warm and dry. No rash noted.  LABORATORY PANEL:   CBC  Recent Labs Lab 11/16/16 0345  WBC 16.5*  HGB 9.5*  HCT 27.3*  PLT 272   ------------------------------------------------------------------------------------------------------------------  Chemistries   Recent Labs Lab 11/15/16 0409 11/16/16 0345  NA 137 135  K 3.0* 3.5  CL 108 107  CO2 20* 20*  GLUCOSE 109* 107*  BUN 39* 31*  CREATININE 1.27* 1.13*  CALCIUM 7.7* 7.7*  MG 1.8  --    ------------------------------------------------------------------------------------------------------------------  Cardiac Enzymes No results for input(s): TROPONINI in the last 168 hours. ------------------------------------------------------------------------------------------------------------------  RADIOLOGY:  No results found.   ASSESSMENT AND PLAN:   [name] with history of progressive metastatic GIST dx 02/2014 and recent diagnosis of bladder tumor s/p TURBT and bilateral placement who presented with left leg pain and found to have DVT.   1. Left lower extremity DVT, nonocclusive in the common femoral vein and occlusive in the posterior tibial vein:due to underlying malignancy Due to anemia and possible need for Bilateral nephrostomy tubes,  Eliquis was discontinuedand restarted heparin treatment. She can not have  IVCF due to underlying cancer and location on IVC. Heparin drip was discontinued due to bladder hematoma,hematuria and decreased hemoglobin. Resume Eliquis per Dr. Erlene Quan. Gross hematuria-resolved.  2. Acute on chronic blood loss anemia with recent diagnosis of bladder tumor status post TURBT: Status post 1 unit PRBC 8/19 and 1 unit on 8/23 and 1 unit on 8/4. Hemoglobin dropped to 7.3 and got 1 unit of PRBC on 8/26. Hemoglobin 9.5.  3.history of GISTT and bladder tumor:  Appreciate urology and oncology evaluation Diagnosis is still unknown. Pathology is pending. Patient had bilateral stents due to bilateral hydronephrosis, which is removed. S/P bilateral nephrostomy tubes by interventional radiologist this am. S/p Cystoscopy with removal of bilateral double-J stents, clot evacuation with fulguration, and transurethral resection of a large bladder tumor on 8/25. Follow-up with urologist as outpatient.  4.hypokalemia: improved with potassium supplement.  Hypomagnesemia. Given IV magnesium and improved.  5. Delirium:Resolved.  Acute renal failure. Possible due to obstruction secondary to bilateral hematoma. Improved after above procedures.  Hyperkalemia. The patient was given 1 dose Kayexalate, improved.  Physical therapy evaluation: SNF.  Management plans discussed with nurse, the patient, her husband, son, brother, who are in agreement CODE STATUS: full  TOTAL TIME TAKING CARE OF THIS PATIENT: 33 minutes.   POSSIBLE D/C 1-2 DEPENDING ON CLINICAL CONDITION.   Demetrios Loll M.D on 11/16/2016 at 4:28 PM  Between 7am to 6pm - Pager - [phone] After 6pm go to [email or web address] - password [email or web address] [facility]  Office  [phone]  CC: Primary care physician; Rusty Aus, MD  Note: This dictation was prepared with Dragon dictation along with smaller phrase technology. Any  transcriptional errors that result from this process are unintentional.

## 2016-11-16 NOTE — NC FL2 (Signed)
[place] LEVEL OF CARE SCREENING TOOL     IDENTIFICATION  Patient Name: Courtney [name] Birthdate: [birth year] Sex: female Admission Date (Current Location): 11/07/2016  [place] and [place] Number:  Engineering geologist and Address:  [facility], [address], [place], [place] [address]      Provider Number: [id number]  Attending Physician Name and Address:  Demetrios Loll, MD  Relative Name and Phone Number:       Current Level of Care: Hospital Recommended Level of Care: [place] Prior Approval Number:    Date Approved/Denied:   PASRR Number: [id number]  Discharge Plan: SNF    Current Diagnoses: Patient Active Problem List   Diagnosis Date Noted  . Other hydronephrosis   . Gross hematuria   . Acute blood loss anemia   . DVT (deep venous thrombosis) ([place]) 11/07/2016  . Acute renal failure ([place]) 10/27/2016  . ARF (acute renal failure) with tubular necrosis ([place]) 10/27/2016  . Diverticulitis 05/11/2014  . 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 Malignant neoplasm of gastrointestinal tract ([place]) 04/04/2014  . DDD (degenerative disc disease), cervical 11/14/2013  . Acid reflux 11/14/2013  . HLD (hyperlipidemia) 11/14/2013    Orientation RESPIRATION BLADDER Height & Weight     Self, Place, Situation  Normal Continent Weight: 129 lb (58.5 kg) Height:  4\' 11"  (149.9 cm)  BEHAVIORAL SYMPTOMS/MOOD NEUROLOGICAL BOWEL NUTRITION STATUS   (none)  (none) Incontinent Diet (heart healthy)  AMBULATORY STATUS COMMUNICATION OF NEEDS Skin   Limited Assist Verbally Normal                       Personal Care Assistance Level of Assistance  Bathing, Dressing Bathing Assistance: Limited assistance   Dressing Assistance: Limited assistance     Functional Limitations Info             SPECIAL CARE FACTORS FREQUENCY  PT (By licensed PT)                    Contractures Contractures Info: Not  present    Additional Factors Info  Code Status, Allergies Code Status Info: full Allergies Info: pcn's; cipro;            Current Medications (11/16/2016):  This is the current hospital active medication list Current Facility-Administered Medications  Medication Dose Route Frequency Provider Last Rate Last Dose  . 0.9 %  sodium chloride infusion   Intravenous Continuous Demetrios Loll, MD 50 mL/hr at 11/15/16 2207    . acetaminophen (TYLENOL) tablet 650 mg  650 mg Oral Q6H PRN Loletha Grayer, MD   650 mg at 11/13/16 2046   Or  . acetaminophen (TYLENOL) suppository 650 mg  650 mg Rectal Q6H PRN Loletha Grayer, MD      . alum & mag hydroxide-simeth (MAALOX/MYLANTA) 200-200-20 MG/5ML suspension 30 mL  30 mL Oral Q4H PRN Demetrios Loll, MD      . apixaban Arne Cleveland) tablet 5 mg  5 mg Oral BID Charlaine Dalton R, MD      . bisacodyl (DULCOLAX) suppository 10 mg  10 mg Rectal Daily Bettey Costa, MD   10 mg at 11/16/16 1004  . cholecalciferol (VITAMIN D) tablet 1,000 Units  1,000 Units Oral Daily Loletha Grayer, MD   1,000 Units at 11/16/16 1003  . cyanocobalamin tablet 1,000 mcg  1,000 mcg Oral Daily Loletha Grayer, MD   1,000 mcg at 11/16/16 1304  . docusate sodium (COLACE) capsule 100  mg  100 mg Oral BID Bettey Costa, MD   100 mg at 11/16/16 1005  . ferrous sulfate tablet 325 mg  325 mg Oral TID WC Loletha Grayer, MD   325 mg at 11/16/16 1304  . hydrALAZINE (APRESOLINE) injection 10 mg  10 mg Intravenous Q4H PRN Saundra Shelling, MD   10 mg at 11/10/16 0112  . morphine 2 MG/ML injection 1 mg  1 mg Intravenous Q4H PRN Hugelmeyer, Alexis, DO   1 mg at 11/15/16 2201  . morphine 2 MG/ML injection 2 mg  2 mg Intravenous Once Nena Polio, MD      . multivitamin with minerals tablet 1 tablet  1 tablet Oral Daily Loletha Grayer, MD   1 tablet at 11/16/16 1005  . multivitamin-lutein (OCUVITE-LUTEIN) capsule 2 capsule  2 capsule Oral Daily Loletha Grayer, MD   2 capsule at 11/16/16 1005  .  ondansetron (ZOFRAN) injection 4 mg  4 mg Intravenous Q8H PRN Hugelmeyer, Alexis, DO   4 mg at 11/16/16 1136  . opium-belladonna (B&O SUPPRETTES) 16.2-60 MG suppository 1 suppository  1 suppository Rectal Q8H PRN Hollice Espy, MD   1 suppository at 11/14/16 [phone]  . oxyCODONE (Oxy IR/ROXICODONE) immediate release tablet 5 mg  5 mg Oral Q6H PRN Loletha Grayer, MD   5 mg at 11/16/16 1003  . phenazopyridine (PYRIDIUM) tablet 100 mg  100 mg Oral TID PRN Hollice Espy, MD   100 mg at 11/11/16 1746  . polyethylene glycol (MIRALAX / GLYCOLAX) packet 17 g  17 g Oral Daily Mody, Sital, MD   17 g at 11/16/16 1004  . risperiDONE (RISPERDAL) tablet 0.25 mg  0.25 mg Oral BID PRN Bettey Costa, MD   0.25 mg at 11/09/16 1884     Discharge Medications: Please see discharge summary for a list of discharge medications.  Relevant Imaging Results:  Relevant Lab Results:   Additional Information ss: [id number]  Shela Leff, LCSW

## 2016-11-17 ENCOUNTER — Telehealth: Payer: Self-pay | Admitting: Internal Medicine

## 2016-11-17 ENCOUNTER — Other Ambulatory Visit: Payer: Self-pay | Admitting: Internal Medicine

## 2016-11-17 ENCOUNTER — Inpatient Hospital Stay: Payer: PPO | Admitting: Internal Medicine

## 2016-11-17 DIAGNOSIS — D500 Iron deficiency anemia secondary to blood loss (chronic): Secondary | ICD-10-CM | POA: Insufficient documentation

## 2016-11-17 DIAGNOSIS — C269 Malignant neoplasm of ill-defined sites within the digestive system: Secondary | ICD-10-CM

## 2016-11-17 LAB — BASIC METABOLIC PANEL
Anion gap: 6 (ref 5–15)
BUN: 17 mg/dL (ref 6–20)
CALCIUM: 7.8 mg/dL — AB (ref 8.9–10.3)
CO2: 22 mmol/L (ref 22–32)
CREATININE: 0.59 mg/dL (ref 0.44–1.00)
Chloride: 108 mmol/L (ref 101–111)
GFR calc non Af Amer: >60 mL/min (ref >60)
Glucose, Bld: 103 mg/dL — ABNORMAL HIGH (ref 65–99)
Potassium: 3.9 mmol/L (ref 3.5–5.1)
SODIUM: 136 mmol/L (ref 135–145)

## 2016-11-17 LAB — CBC
HEMATOCRIT: 27.5 % — AB (ref 35.0–47.0)
Hemoglobin: 9.6 g/dL — ABNORMAL LOW (ref 12.0–16.0)
MCH: 32.3 pg (ref 26.0–34.0)
MCHC: 35 g/dL (ref 32.0–36.0)
MCV: 92.3 fL (ref 80.0–100.0)
Platelets: 318 10*3/uL (ref 150–440)
RBC: 2.97 MIL/uL — ABNORMAL LOW (ref 3.80–5.20)
RDW: 17.4 % — AB (ref 11.5–14.5)
WBC: 16.5 10*3/uL — ABNORMAL HIGH (ref 3.6–11.0)

## 2016-11-17 LAB — SURGICAL PATHOLOGY

## 2016-11-17 MED ORDER — SULFAMETHOXAZOLE-TRIMETHOPRIM 800-160 MG PO TABS
1.0000 | ORAL_TABLET | Freq: Two times a day (BID) | ORAL | Status: DC
  Administered 2016-11-17 – 2016-11-18 (×3): 1 via ORAL
  Filled 2016-11-17 (×4): qty 1

## 2016-11-17 MED ORDER — FERROUS SULFATE 325 (65 FE) MG PO TABS: 325.0000 mg | ORAL_TABLET | Freq: Three times a day (TID) | ORAL | 0 refills | Status: DC

## 2016-11-17 MED ORDER — APIXABAN 5 MG PO TABS: 5.0000 mg | ORAL_TABLET | Freq: Two times a day (BID) | ORAL | 0 refills | Status: DC

## 2016-11-17 MED ORDER — DOCUSATE SODIUM 100 MG PO CAPS: 100.0000 mg | ORAL_CAPSULE | Freq: Two times a day (BID) | ORAL | 0 refills | Status: DC

## 2016-11-17 MED ORDER — ALUM & MAG HYDROXIDE-SIMETH 200-200-20 MG/5ML PO SUSP: 30.0000 mL | ORAL | 0 refills | Status: DC | PRN

## 2016-11-17 MED ORDER — OXYCODONE HCL 5 MG PO TABS: 5.0000 mg | ORAL_TABLET | Freq: Four times a day (QID) | ORAL | 0 refills | Status: DC | PRN

## 2016-11-17 MED ORDER — ONDANSETRON HCL 4 MG PO TABS: 4.0000 mg | ORAL_TABLET | Freq: Three times a day (TID) | ORAL | 0 refills | Status: AC | PRN

## 2016-11-17 MED ORDER — SULFAMETHOXAZOLE-TRIMETHOPRIM 800-160 MG PO TABS: 1.0000 | ORAL_TABLET | Freq: Two times a day (BID) | ORAL | 0 refills | Status: DC

## 2016-11-17 NOTE — Clinical Social Work Note (Signed)
CSW spoke to insurance company they have not approved patient for SNF yet.  CSW to cotinue to follow patient's progress throughout discharge planning.  Jones Broom. Thornton, MSW, [place]  11/17/2016 3:40 PM

## 2016-11-17 NOTE — Telephone Encounter (Addendum)
Oral Oncology Patient Advocate Encounter  Received notification from [employer] that prior authorization for [facility] is required.  PA submitted on CoverMyMeds Key [id number] Status is pending  Oral Oncology Clinic will continue to follow.  [place] Patient Advocate [phone] 11/17/2016 10:37 AM    Oral Oncology Patient Advocate Encounter  Prior Authorization for [facility]ihara has been approved.    PA# [id number]  Effective dates: 11/16/2016 through 03/22/2017  Patients copay is $6153.79 Working on Insurance underwriter for patient.   Oral Oncology Clinic will continue to follow.  [place] Patient Advocate [phone] 11/17/2016 10:40 AM    Oral Oncology Patient Advocate Encounter  Applied to [employer] for manufacture assistance for copay assistance. Waiting on patient to sign and then I will mail to company.  She has an appointment in the [facility] office on 11/24/2016. I will fax information there for her to sign.   [place] Patient Advocate [phone] 11/19/2016 8:33 AM

## 2016-11-17 NOTE — Progress Notes (Signed)
Physical Therapy Treatment Patient Details Name: Courtney [name] MRN: [id number] DOB: 1940-06-25 Today's Date: 11/17/2016    History of Present Illness presented to ER secondary to L LE pain; admitted with L LE DVT (non-occlusive to common femoral vein, occlusive to posterior tibial vein).  Admitted for hospitalization for management of anticoagulation.  Patient with history of GIST abdominal tumor, bladder tumor; status post ureteral stent (8/13).  Hospital stay complicated by bladder hematoma requiring CBI and intermittent agitation/confusion.  Now status post bilat neph tubes 8/25.    PT Comments    Significant improvement in alertness/orientation, participation and overall activity tolerance this date.  Slow and guarded with movement transitions due to flank pain, but completes with less assist than required on previous date.  Able to initiate gait training both with and without RW, cga/min assist from therapist.  Slight sway, stagger with dynamic gait components requiring cga assist from therapist (and use of LE step strategy from patient) to recover LOB.  Do recommend hands-on assist (cga) at all times for optimal safety.  Poor awareness of placement/position of nephrostomy tubes; dep management from therapist to prevent pulling/tension. Will continue to assess mobility performance throughout stay and update discharge recommendations as appropriate.     Follow Up Recommendations  SNF     Equipment Recommendations  Rolling walker with 5" wheels    Recommendations for Other Services       Precautions / Restrictions Precautions Precautions: Fall Precaution Comments: bilat neph tubes Restrictions Weight Bearing Restrictions: No    Mobility  Bed Mobility Overal bed mobility: Needs Assistance Bed Mobility: Supine to Sit     Supine to sit: Supervision        Transfers Overall transfer level: Needs assistance Equipment used: Rolling walker (2 wheeled) Transfers: Sit to/from  Stand Sit to Stand: Min guard;Min assist         General transfer comment: cuing for hand placement, tends to pull on RW; does require UE support to safely complete  Ambulation/Gait Ambulation/Gait assistance: Min guard;Min assist Ambulation Distance (Feet): 50 Feet Assistive device: Rolling walker (2 wheeled)       General Gait Details: reciprocal stepping pattern, min cuing for postural extension; mild sway with dynamic gait components, but no overt bucklign or LOB.  Minimal use of RW noted.   Stairs            Wheelchair Mobility    Modified Rankin (Stroke Patients Only)       Balance Overall balance assessment: Needs assistance Sitting-balance support: No upper extremity supported;Feet supported Sitting balance-Leahy Scale: Good     Standing balance support: Bilateral upper extremity supported Standing balance-Leahy Scale: Fair                              Cognition Arousal/Alertness: Awake/alert Behavior During Therapy: WFL for tasks assessed/performed Overall Cognitive Status: Within Functional Limits for tasks assessed                                 General Comments: improved orientation, insight and awareness to situation this date      Exercises Other Exercises Other Exercises: 150' without assist device, cga/min assist-improved arm swing and trunk rotation; fair cadence with improved mechanics.  continued sway, slight stagger, with dynamic gait components with heavy reliance on LE step strategy for recovery (due to limited hip/ankle balance strategies) Other Exercises: Rolling  bilat, min assist, for management of incontinent BM; dep assist for hygiene, clothing/linen change.  Bridging in supine for additional hygiene, peri-care, close sup-able to fully clear buttocks from bed surface with increased time this date.  Slow, guarded movement transitions.    General Comments        Pertinent Vitals/Pain Pain Assessment:  Faces Faces Pain Scale: Hurts little more Pain Location: R/L flank Pain Descriptors / Indicators: Aching;Grimacing;Guarding Pain Intervention(s): Limited activity within patient's tolerance;Monitored during session;Repositioned    Home Living                      Prior Function            PT Goals (current goals can now be found in the care plan section) Acute Rehab PT Goals Patient Stated Goal: I have been here 18 days; I need to get out of here PT Goal Formulation: With patient Time For Goal Achievement: 11/30/16 Potential to Achieve Goals: Fair Progress towards PT goals: Progressing toward goals    Frequency    Min 2X/week      PT Plan Current plan remains appropriate    Co-evaluation              AM-PAC PT "6 Clicks" Daily Activity  Outcome Measure  Difficulty turning over in bed (including adjusting bedclothes, sheets and blankets)?: A Lot Difficulty moving from lying on back to sitting on the side of the bed? : A Lot Difficulty sitting down on and standing up from a chair with arms (e.g., wheelchair, bedside commode, etc,.)?: Unable Help needed moving to and from a bed to chair (including a wheelchair)?: A Little Help needed walking in hospital room?: A Little Help needed climbing 3-5 steps with a railing? : A Lot 6 Click Score: 13    End of Session Equipment Utilized During Treatment: Gait belt Activity Tolerance: Patient tolerated treatment well Patient left: in bed;with call bell/phone within reach;with bed alarm set Nurse Communication: Mobility status PT Visit Diagnosis: Muscle weakness (generalized) (M62.81);Difficulty in walking, not elsewhere classified (R26.2)     Time: 9470-9628 PT Time Calculation (min) (ACUTE ONLY): 34 min  Charges:  $Gait Training: 8-22 mins $Therapeutic Activity: 8-22 mins                    G Codes:       Oni Dietzman H. Owens Shark, PT, DPT, NCS 11/17/16, 5:06 PM [phone]

## 2016-11-17 NOTE — Telephone Encounter (Signed)
Patient is being discharged to the hospital today. Please have this patient follow-up with me in 1 week/in [place]; CBC/BMP; Venofer infusion.

## 2016-11-17 NOTE — Clinical Social Work Note (Signed)
Clinical Social Work Assessment  Patient Details  Name: Courtney [name] MRN: [id number] Date of Birth: [birth year]  Date of referral:  11/17/16               Reason for consult:  Facility Placement                Permission sought to share information with:  Facility Sport and exercise psychologist, Family Supports Permission granted to share information::  Yes, Verbal Permission Granted  Name::     Courtney, [name] [phone]  [phone]   Agency::  SNF admissions  Relationship::     Contact Information:     Housing/Transportation Living arrangements for the past 2 months:  [place] of Information:  Patient, Adult Children Patient Interpreter Needed:  None Criminal Activity/Legal Involvement Pertinent to Current Situation/Hospitalization:  No - Comment as needed Significant Relationships:  Adult Children Lives with:  Self Do you feel safe going back to the place where you live?  No Need for family participation in patient care:  Yes (Comment)  Care giving concerns:  Patient and family feel she needs some short term rehab before returning back home.   Social Worker assessment / plan:  Patient is a 76 year old female who lives alone, patient is alert and oriented x3.  Patient states she has not been to rehab before, CSW explained to her what to expect and what the role of CSW is for trying to find placement for patient.  Patient was explained how insurance will pay for her stay, and also what to expect day of discharge from hospital.  Patient gave CSW permission to speak with her son in regards to trying to help with decision on which SNF to go to.  CSW spoke to patient's son and explained the process for getting SNF placement and what to expect while she is at rehab.  CSW was given permission by patient and her son to begin bed search in [place].  Patient and her son did not express any other questions or concerns.  Employment status:  Retired Programmer, applications PT Recommendations:  [place] / Referral to community resources:  [place]  Patient/Family's Response to care:  Patient and family are agreeable about going to SNF for short term rehab.  Patient/Family's Understanding of and Emotional Response to Diagnosis, Current Treatment, and Prognosis:  Patient expressed that she is hopeful she will not have to be at SNF very long.  Emotional Assessment Appearance:  Appears stated age Attitude/Demeanor/Rapport:    Affect (typically observed):  Appropriate, Calm Orientation:  Oriented to Self, Oriented to Place, Oriented to  Time Alcohol / Substance use:  Not Applicable Psych involvement (Current and /or in the community):  No (Comment)  Discharge Needs  Concerns to be addressed:  Lack of Support Readmission within the last 30 days:  No Current discharge risk:  Lack of support system Barriers to Discharge:  Ship broker, Continued Medical Work up   Anell Barr 11/17/2016, 6:11 PM

## 2016-11-17 NOTE — Clinical Social Work Note (Addendum)
CSW presented bed offers to patient's son Gwyndolyn Saxon, [phone], he is going to review the options and contact CSW back.  CSW informed him that patient is ready for discharge today, and CSW will have to let insurance know which facility she can go to.  CSW received phone call from patient's son Gwyndolyn Saxon, he has chosen [facility], [facility] contacted SNF and they can accept patient once insurance has been approved for SNF placement.  Jones Broom. Marydel, MSW, [place]  11/17/2016 1:58 PM

## 2016-11-17 NOTE — Progress Notes (Signed)
Urology Consult Follow Up 3 Days Post -Op Subjective: Patient is demanding to be sent home this am.  VSS afebrile this am.  Creatinine down to 0.59 from 1.13.  Hbg up to 9.6 from 7.3.  Complains of pain in the back which she attributes to laying in the bed.  Clear yellow urine draining from left tube and a very light pink draining from the right tube.    Anti-infectives: Anti-infectives    Start     Dose/Rate Route Frequency Ordered Stop   11/17/16 1000  sulfamethoxazole-trimethoprim (BACTRIM DS,SEPTRA DS) 800-160 MG per tablet 1 tablet     1 tablet Oral Every 12 hours 11/17/16 0735     11/14/16 0900  clindamycin (CLEOCIN) IVPB 300 mg  Status:  Discontinued     300 mg 100 mL/hr over 30 Minutes Intravenous On call 11/14/16 0847 11/14/16 1447   11/14/16 0900  clindamycin (CLEOCIN) IVPB 300 mg     300 mg 100 mL/hr over 30 Minutes Intravenous  Once 11/14/16 0847 11/14/16 0906   11/14/16 0600  ceFAZolin (ANCEF) IVPB 2g/100 mL premix     2 g 200 mL/hr over 30 Minutes Intravenous  Once 11/13/16 2025 11/14/16 1304      Current Facility-Administered Medications  Medication Dose Route Frequency Provider Last Rate Last Dose  . acetaminophen (TYLENOL) tablet 650 mg  650 mg Oral Q6H PRN Loletha Grayer, MD   650 mg at 11/13/16 2046   Or  . acetaminophen (TYLENOL) suppository 650 mg  650 mg Rectal Q6H PRN Loletha Grayer, MD      . alum & mag hydroxide-simeth (MAALOX/MYLANTA) 200-200-20 MG/5ML suspension 30 mL  30 mL Oral Q4H PRN Demetrios Loll, MD      . apixaban Arne Cleveland) tablet 5 mg  5 mg Oral BID Cammie Sickle, MD   5 mg at 11/16/16 2109  . bisacodyl (DULCOLAX) suppository 10 mg  10 mg Rectal Daily Bettey Costa, MD   10 mg at 11/16/16 1004  . cholecalciferol (VITAMIN D) tablet 1,000 Units  1,000 Units Oral Daily Loletha Grayer, MD   1,000 Units at 11/16/16 1003  . docusate sodium (COLACE) capsule 100 mg  100 mg Oral BID Bettey Costa, MD   100 mg at 11/16/16 2109  . ferrous sulfate tablet  325 mg  325 mg Oral TID WC Loletha Grayer, MD   325 mg at 11/16/16 1746  . hydrALAZINE (APRESOLINE) injection 10 mg  10 mg Intravenous Q4H PRN Saundra Shelling, MD   10 mg at 11/10/16 0112  . morphine 2 MG/ML injection 1 mg  1 mg Intravenous Q4H PRN Hugelmeyer, Alexis, DO   1 mg at 11/17/16 0409  . morphine 2 MG/ML injection 2 mg  2 mg Intravenous Once Nena Polio, MD      . multivitamin with minerals tablet 1 tablet  1 tablet Oral Daily Loletha Grayer, MD   1 tablet at 11/16/16 1005  . multivitamin-lutein (OCUVITE-LUTEIN) capsule 2 capsule  2 capsule Oral Daily Loletha Grayer, MD   2 capsule at 11/16/16 1005  . ondansetron (ZOFRAN) injection 4 mg  4 mg Intravenous Q8H PRN Hugelmeyer, Alexis, DO   4 mg at 11/16/16 1136  . opium-belladonna (B&O SUPPRETTES) 16.2-60 MG suppository 1 suppository  1 suppository Rectal Q8H PRN Hollice Espy, MD   1 suppository at 11/14/16 [phone]  . oxyCODONE (Oxy IR/ROXICODONE) immediate release tablet 5 mg  5 mg Oral Q6H PRN Loletha Grayer, MD   5 mg at 11/17/16 0640  .  phenazopyridine (PYRIDIUM) tablet 100 mg  100 mg Oral TID PRN Hollice Espy, MD   100 mg at 11/11/16 1746  . polyethylene glycol (MIRALAX / GLYCOLAX) packet 17 g  17 g Oral Daily Mody, Sital, MD   17 g at 11/16/16 1004  . risperiDONE (RISPERDAL) tablet 0.25 mg  0.25 mg Oral BID PRN Bettey Costa, MD   0.25 mg at 11/09/16 0936  . sulfamethoxazole-trimethoprim (BACTRIM DS,SEPTRA DS) 800-160 MG per tablet 1 tablet  1 tablet Oral Q12H Demetrios Loll, MD      . vitamin B-12 (CYANOCOBALAMIN) tablet 1,000 mcg  1,000 mcg Oral Daily Loletha Grayer, MD   1,000 mcg at 11/16/16 1304     Objective: Vital signs in last 24 hours: Temp:  [98.7 F (37.1 C)-99.3 F (37.4 C)] 98.7 F (37.1 C) (08/28 0441) Pulse Rate:  [99-106] 99 (08/28 0441) Resp:  [17-18] 18 (08/28 0441) BP: (129-146)/(54-73) 143/73 (08/28 0441) SpO2:  [97 %-99 %] 98 % (08/28 0441)  Intake/Output from previous day: 08/27 0701 - 08/28  0700 In: 522 [I.V.:522] Out: 1275 [Urine:1275] Intake/Output this shift: No intake/output data recorded.   Physical Exam Constitutional: Well nourished. Alert and oriented, No acute distress. HEENT: [place] AT, moist mucus membranes. Trachea midline, no masses. Cardiovascular: No clubbing, cyanosis, or edema. Respiratory: Normal respiratory effort, no increased work of breathing. GI: Abdomen is soft, non tender, non distended, no abdominal masses.  GU: No CVA tenderness.  No bladder fullness or masses.  Dressings are clean and dry.  Skin: No rashes, bruises or suspicious lesions. Lymph: No cervical or inguinal adenopathy. Neurologic: Grossly intact, no focal deficits, moving all 4 extremities. Psychiatric: Normal mood and affect.  Lab Results:   Recent Labs  11/16/16 0345 11/17/16 0510  WBC 16.5* 16.5*  HGB 9.5* 9.6*  HCT 27.3* 27.5*  PLT 272 318   BMET  Recent Labs  11/16/16 0345 11/17/16 0510  NA 135 136  K 3.5 3.9  CL 107 108  CO2 20* 22  GLUCOSE 107* 103*  BUN 31* 17  CREATININE 1.13* 0.59  CALCIUM 7.7* 7.8*   PT/INR No results for input(s): LABPROT, INR in the last 72 hours. ABG No results for input(s): PHART, HCO3 in the last 72 hours.  Invalid input(s): PCO2, PO2  Studies/Results: No results found.   Assessment and Plan -bladder cancer-Status post TURBT, stents August 13 with bilateral percutaneous nephrostomies, clot evacuation, residual TURBT and stent removal August 25. Foley out August 26. Path review pending by [facility]. -Bilateral hydronephrosis, AKI -status post bilateral percutaneous nephrostomy tubes, kidney function improving -Gross hematuria-resolved. OK to resume anticoagulation.   - stable for discharge from a urological standpoint as hbg and creatinine are much improved - will need follow up one week after discharge in the office   LOS: 10 days    [facility] 11/17/2016

## 2016-11-17 NOTE — Progress Notes (Signed)
Initial Nutrition Assessment  DOCUMENTATION CODES:   Non-severe (moderate) malnutrition in context of chronic illness  INTERVENTION:   Mighty Shake II TID with meals, each supplement provides 480-500 kcals and 20-23 grams of protein  MVI  Snacks  NUTRITION DIAGNOSIS:   Malnutrition (moderate) related to cancer and cancer related treatments as evidenced by moderate to severe depletions of body fat in arms and chest, moderate to severe depletions of muscle mass over entire body.  GOAL:   Patient will meet greater than or equal to 90% of their needs  MONITOR:   PO intake, Supplement acceptance, Labs, Weight trends  REASON FOR ASSESSMENT:   LOS    ASSESSMENT:   [name] with history of progressive metastatic GIST dx 02/2014 and recent diagnosis of bladder tumor s/p TURBT and bilateral placement who presented with left leg pain and found to have DVT. Pt status post TURBT, stents August 13 with bilateral percutaneous nephrostomies, clot evacuation, residual TURBT and stent removal August 25. Foley out August 26.   Met with pt and family in room today. Pt reports poor appetite and oral intake for several weeks pta and since admit. Pt is documented to have eaten 100% of one meal on 8/24. Family has been bringing in food for pt; yesterday she ate one dumpling and drank a few sips of a peach milkshake. Pt does not want supplements such as Ensure but she is willing to try Mighty Shakes and snacks. RD encouraged pt's family to keep bringing in foods that she enjoys. Per chart, pt is weight stable pta. Pt has not been weighed since admit; RD will order weight. RD discussed the importance of adequate protein intake for healing and to retain lean muscle mass. Continue to encourage intake of meals and snacks. RD noted during chart review that pt is currently getting 3 multivitamin tablets a day; spoke to RN about this who will find out if this was intentional.   Medications reviewed and include:  dulcolax, Vit D, colace, ferous sulfate, morphine, MVI, Ocuvite-lutein, sulfamethoxazole-trimethoprim, Vit B-12, zofran, oxycodone, miralax  Labs reviewed: Ca 7.8(L) Mg 1.8 wnl- 8/26 Wbc- 16.5(H), Hgb 9.6(L), Hct 27.5(L)  Nutrition-Focused physical exam completed. Findings are moderate to severe fat depletions in arms and chest, moderate to severe muscle depletions over entire body, and mild edema in BLE.   Diet Order:  Diet Heart Room service appropriate? Yes; Fluid consistency: Thin Diet - low sodium heart healthy  Skin:  Wound (see comment) (incision s/p nephrostomy tube placement )  Last BM:  8/27  Height:   Ht Readings from Last 1 Encounters:  11/07/16 _0  (1.499 m)    Weight:   Wt Readings from Last 1 Encounters:  11/07/16 129 lb (58.5 kg)    Ideal Body Weight:  44.5 kg  BMI:  Body mass index is 26.05 kg/m.  Estimated Nutritional Needs:   Kcal:  1300-1500kcal/day   Protein:  76-88g/day   Fluid:  >1.3L/day   EDUCATION NEEDS:   Education needs addressed  Koleen Distance MS, RD, LDN Pager #[phone] After Hours Pager: [phone]

## 2016-11-17 NOTE — Discharge Summary (Addendum)
[facility] [place] NAME: Courtney [name]    MR#:  [id number]  DATE OF BIRTH:  1940/11/06  DATE OF ADMISSION:  11/07/2016   ADMITTING PHYSICIAN: Loletha Grayer, MD  DATE OF DISCHARGE: 11/17/2016 PRIMARY CARE PHYSICIAN: Rusty Aus, MD   ADMISSION DIAGNOSIS:  Lymphadenopathy [R59.1] Left flank pain [R10.9] Acute deep vein thrombosis (DVT) of left lower extremity, unspecified vein (HCC) [I82.402] DISCHARGE DIAGNOSIS:  Active Problems:   DVT (deep venous thrombosis) (HCC)   Gross hematuria   Acute blood loss anemia   Other hydronephrosis  SECONDARY DIAGNOSIS:   Past Medical History:  Diagnosis Date  . Anemia   . Anxiety   . Arthritis   . Cancer [facility])    GIST  . Depression   . GERD (gastroesophageal reflux disease)   . GIST (gastrointestinal stroma tumor), malignant, colon ([place])   . History of hiatal hernia   . History of kidney stones   . Hypertension    NO MEDS NOW  . IBS (irritable bowel syndrome)   . 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   1. Left lower extremity DVT, nonocclusive in the common femoral vein and occlusive in the posterior tibial vein:due to underlying malignancy Due to anemia and possible need for Bilateral nephrostomy tubes, Eliquis was discontinued and restarted heparin treatment. She can not have IVCF due to underlying cancer and location on IVC. Heparin drip was discontinued due to bladder hematoma,hematuria and decreased hemoglobin. Resumed Eliquis per Dr. Erlene Quan. Gross hematuria-resolved.  2. Acute on chronic blood loss anemia with recent diagnosis of bladder tumor status post TURBT: Status post 1 unit PRBC 8/19 and 1 unit on 8/23 and 1 unit on 8/4. Hemoglobin dropped to 7.3 and got 1  unit of PRBC on 8/26. Hemoglobin 9.6.  3.history of GISTT and bladder tumor:  Appreciate urology and oncology evaluation Diagnosis is still unknown. Pathology is pending. Patient had bilateral stents due to bilateral hydronephrosis, which is removed. S/P bilateral nephrostomy tubes by interventional radiologist this am. S/p Cystoscopy with removal of bilateral double-J stents, clot evacuation with fulguration, and transurethral resection of a large bladder tumor on 8/25. Follow-up with urologist as outpatient.  Leukocytosis and UTI with positive urine culture STAPHYLOCOCCUS SPECIES (COAGULASE NEGATIVE). Bactrim bid for 5 days. Follow up CBC as outpatient.  4.hypokalemia: improved with potassium supplement.  Hypomagnesemia. Given IV magnesium and improved.  5. Delirium:Resolved.  Acute renal failure. Possible due to obstruction secondary to bilateral hematoma. Improved after above procedures.  Hyperkalemia. The patient was given 1 dose Kayexalate, improved.  Non-severe (moderate) malnutrition in context of chronic illness. Follow dietitian's recommendation.  Physical therapy evaluation: SNF.  DISCHARGE CONDITIONS:  Stable, discharge to SNF today. CONSULTS OBTAINED:  Treatment Team:  Heron Sabins, MD Hollice Espy, MD Cammie Sickle, MD Irine Seal, MD DRUG ALLERGIES:   Allergies  Allergen Reactions  . Other Swelling    Magic mouthwash  . Penicillins Swelling    Has patient had a PCN reaction causing immediate rash, facial/tongue/throat swelling, SOB or lightheadedness with hypotension: No Has patient had a PCN reaction causing severe rash involving mucus membranes or skin necrosis: No Has patient had a PCN reaction that required hospitalization: No Has patient had a PCN  reaction occurring within the last 10 years: No If all of the above answers are "NO", then may proceed with Cephalosporin use.    . Ciprofloxacin Diarrhea    GI distress, abd  cramping   DISCHARGE MEDICATIONS:   Allergies as of 11/17/2016      Reactions   Other Swelling   Magic mouthwash   Penicillins Swelling   Has patient had a PCN reaction causing immediate rash, facial/tongue/throat swelling, SOB or lightheadedness with hypotension: No Has patient had a PCN reaction causing severe rash involving mucus membranes or skin necrosis: No Has patient had a PCN reaction that required hospitalization: No Has patient had a PCN reaction occurring within the last 10 years: No If all of the above answers are "NO", then may proceed with Cephalosporin use.   Ciprofloxacin Diarrhea   GI distress, abd cramping      Medication List    TAKE these medications   alum & mag hydroxide-simeth 200-200-20 MG/5ML suspension Commonly known as:  MAALOX/MYLANTA Take 30 mLs by mouth every 4 (four) hours as needed for indigestion.   apixaban 5 MG Tabs tablet Commonly known as:  ELIQUIS Take 1 tablet (5 mg total) by mouth 2 (two) times daily.   docusate sodium 100 MG capsule Commonly known as:  COLACE Take 1 capsule (100 mg total) by mouth 2 (two) times daily.   ferrous sulfate 325 (65 FE) MG tablet Take 1 tablet (325 mg total) by mouth 3 (three) times daily with meals.   gabapentin 100 MG capsule Commonly known as:  NEURONTIN Take 100 mg by mouth 3 (three) times daily.   megestrol 40 MG tablet Commonly known as:  MEGACE daily three days a week   multivitamin tablet Take 1 tablet by mouth daily.   ondansetron 4 MG tablet Commonly known as:  ZOFRAN Take 1 tablet (4 mg total) by mouth every 8 (eight) hours as needed for nausea or vomiting.   oxyCODONE 5 MG immediate release tablet Commonly known as:  Oxy IR/ROXICODONE Take 1 tablet (5 mg total) by mouth every 6 (six) hours as needed for moderate pain or severe pain.   potassium chloride SA 20 MEQ tablet Commonly known as:  K-DUR,KLOR-CON Take 1 tablet (20 mEq total) by mouth daily.   PRESERVISION AREDS PO Take  2 tablets by mouth daily.   RA VITAMIN B-12 TR 1000 MCG Tbcr Generic drug:  Cyanocobalamin Take 2 tablets by mouth daily.   regorafenib 40 MG tablet Commonly known as:  STIVARGA Take 2 tablets (80 mg total) by mouth daily with breakfast on Day 1-21 of 28 Day cycle. Take with low fat meal.   sulfamethoxazole-trimethoprim 800-160 MG tablet Commonly known as:  BACTRIM DS,SEPTRA DS Take 1 tablet by mouth every 12 (twelve) hours.   VITAMIN D3 ADULT GUMMIES 1000 units Chew Generic drug:  Cholecalciferol Chew 1 each by mouth daily.   VITRON-C 65-125 MG Tabs Generic drug:  Iron-Vitamin C Take 2 tablets by mouth daily.   ZYRTEC PO Take 1 tablet by mouth daily.            Discharge Care Instructions        Start     Ordered   11/17/16 0000  apixaban (ELIQUIS) 5 MG TABS tablet  2 times daily     11/17/16 1018   11/17/16 0000  ferrous sulfate 325 (65 FE) MG tablet  3 times daily with meals     11/17/16 1018   11/17/16 0000  oxyCODONE (OXY  IR/ROXICODONE) 5 MG immediate release tablet  Every 6 hours PRN     11/17/16 1018   11/17/16 0000  sulfamethoxazole-trimethoprim (BACTRIM DS,SEPTRA DS) 800-160 MG tablet  Every 12 hours     11/17/16 1018   11/17/16 0000  alum & mag hydroxide-simeth (MAALOX/MYLANTA) 200-200-20 MG/5ML suspension  Every 4 hours PRN     11/17/16 1018   11/17/16 0000  Increase activity slowly     11/17/16 1035   11/17/16 0000  Diet - low sodium heart healthy     11/17/16 1035   11/17/16 0000  ondansetron (ZOFRAN) 4 MG tablet  Every 8 hours PRN     11/17/16 1038   11/17/16 0000  docusate sodium (COLACE) 100 MG capsule  2 times daily     11/17/16 1038       DISCHARGE INSTRUCTIONS:  See AVS.  If you experience worsening of your admission symptoms, develop shortness of breath, life threatening emergency, suicidal or homicidal thoughts you must seek medical attention immediately by calling 911 or calling your MD immediately  if symptoms less severe.  You Must  read complete instructions/literature along with all the possible adverse reactions/side effects for all the Medicines you take and that have been prescribed to you. Take any new Medicines after you have completely understood and accpet all the possible adverse reactions/side effects.   Please note  You were cared for by a hospitalist during your hospital stay. If you have any questions about your discharge medications or the care you received while you were in the hospital after you are discharged, you can call the unit and asked to speak with the hospitalist on call if the hospitalist that took care of you is not available. Once you are discharged, your primary care physician will handle any further medical issues. Please note that NO REFILLS for any discharge medications will be authorized once you are discharged, as it is imperative that you return to your primary care physician (or establish a relationship with a primary care physician if you do not have one) for your aftercare needs so that they can reassess your need for medications and monitor your lab values.    On the day of Discharge:  VITAL SIGNS:  Blood pressure 138/69, pulse (!) 104, temperature 98.3 F (36.8 C), temperature source Oral, resp. rate 16, height 4\' 11"  (1.499 m), weight 129 lb (58.5 kg), SpO2 98 %. PHYSICAL EXAMINATION:  GENERAL:  76 y.o.-year-old patient lying in the bed with no acute distress.  EYES: Pupils equal, round, reactive to light and accommodation. No scleral icterus. Extraocular muscles intact.  HEENT: Head atraumatic, normocephalic. Oropharynx and nasopharynx clear.  NECK:  Supple, no jugular venous distention. No thyroid enlargement, no tenderness.  LUNGS: Normal breath sounds bilaterally, no wheezing, rales,rhonchi or crepitation. No use of accessory muscles of respiration.  CARDIOVASCULAR: S1, S2 normal. No murmurs, rubs, or gallops.  ABDOMEN: Soft, non-tender, non-distended. Bowel sounds present. No  organomegaly or mass.  EXTREMITIES: No pedal edema, cyanosis, or clubbing.  NEUROLOGIC: Cranial nerves II through XII are intact. Muscle strength 4/5 in all extremities. Sensation intact. Gait not checked.  PSYCHIATRIC: The patient is alert and oriented x 3.  SKIN: No obvious rash, lesion, or ulcer.  DATA REVIEW:   CBC  Recent Labs Lab 11/17/16 0510  WBC 16.5*  HGB 9.6*  HCT 27.5*  PLT 318    Chemistries   Recent Labs Lab 11/15/16 0409  11/17/16 0510  NA 137  < > 136  K 3.0*  < > 3.9  CL 108  < > 108  CO2 20*  < > 22  GLUCOSE 109*  < > 103*  BUN 39*  < > 17  CREATININE 1.27*  < > 0.59  CALCIUM 7.7*  < > 7.8*  MG 1.8  --   --   < > = values in this interval not displayed.   Microbiology Results  Results for orders placed or performed during the hospital encounter of 11/07/16  Urine culture     Status: None   Collection Time: 11/07/16 10:23 AM  Result Value Ref Range Status   Specimen Description URINE, RANDOM  Final   Special Requests NONE  Final   Culture   Final    NO GROWTH Performed at [facility], [place] [address] [place], [place] [address]    Report Status 11/08/2016 FINAL  Final  Urine Culture     Status: Abnormal   Collection Time: 11/12/16  6:00 PM  Result Value Ref Range Status   Specimen Description URINE, RANDOM  Final   Special Requests NONE  Final   Culture (A)  Final    >=100,000 COLONIES/mL STAPHYLOCOCCUS SPECIES (COAGULASE NEGATIVE)   Report Status 11/15/2016 FINAL  Final   Organism ID, Bacteria STAPHYLOCOCCUS SPECIES (COAGULASE NEGATIVE) (A)  Final      Susceptibility   Staphylococcus species (coagulase negative) - MIC*    CIPROFLOXACIN >=8 RESISTANT Resistant     GENTAMICIN <=0.5 SENSITIVE Sensitive     NITROFURANTOIN <=16 SENSITIVE Sensitive     OXACILLIN >=4 RESISTANT Resistant     TETRACYCLINE <=1 SENSITIVE Sensitive     VANCOMYCIN 2 SENSITIVE Sensitive     TRIMETH/SULFA <=10 SENSITIVE Sensitive     CLINDAMYCIN <=0.25  SENSITIVE Sensitive     RIFAMPIN <=0.5 SENSITIVE Sensitive     Inducible Clindamycin NEGATIVE Sensitive     * >=100,000 COLONIES/mL STAPHYLOCOCCUS SPECIES (COAGULASE NEGATIVE)    RADIOLOGY:  No results found.   Management plans discussed with the patient, her husband, sisters, brother, and they are in agreement.  CODE STATUS: Full Code   TOTAL TIME TAKING CARE OF THIS PATIENT: 45 minutes.    Demetrios Loll M.D on 11/17/2016 at 10:38 AM  Between 7am to 6pm - Pager - [phone]  After 6pm go to [email or web address] - Proofreader  [facility]  Office  [phone]  CC: Primary care physician; Rusty Aus, MD   Note: This dictation was prepared with Dragon dictation along with smaller phrase technology. Any transcriptional errors that result from this process are unintentional.

## 2016-11-17 NOTE — Plan of Care (Signed)
Problem: Education: Goal: Knowledge of [facility] Education information/materials will improve Outcome: Not Progressing Patient has intermittent confusion and needs assistance.  Problem: Health Behavior/Discharge Planning: Goal: Ability to manage health-related needs will improve Outcome: Progressing Patient needs assistance.

## 2016-11-17 NOTE — Telephone Encounter (Signed)
Message has been sent to scheduling team. Orders have been entered.

## 2016-11-18 DIAGNOSIS — E440 Moderate protein-calorie malnutrition: Secondary | ICD-10-CM | POA: Insufficient documentation

## 2016-11-18 DIAGNOSIS — F419 Anxiety disorder, unspecified: Secondary | ICD-10-CM

## 2016-11-18 LAB — CBC
HCT: 28.6 % — ABNORMAL LOW (ref 35.0–47.0)
HEMOGLOBIN: 9.9 g/dL — AB (ref 12.0–16.0)
MCH: 32.6 pg (ref 26.0–34.0)
MCHC: 34.6 g/dL (ref 32.0–36.0)
MCV: 94.3 fL (ref 80.0–100.0)
Platelets: 352 10*3/uL (ref 150–440)
RBC: 3.03 MIL/uL — ABNORMAL LOW (ref 3.80–5.20)
RDW: 17.7 % — AB (ref 11.5–14.5)
WBC: 15.3 10*3/uL — ABNORMAL HIGH (ref 3.6–11.0)

## 2016-11-18 MED ORDER — APIXABAN 5 MG PO TABS: 5.0000 mg | ORAL_TABLET | Freq: Every day | ORAL | 0 refills | Status: DC

## 2016-11-18 NOTE — Discharge Summary (Signed)
[facility] [place] NAME: Courtney [name]    MR#:  [id number]  DATE OF BIRTH:  1940/04/14  DATE OF ADMISSION:  11/07/2016   ADMITTING PHYSICIAN: Loletha Grayer, MD  DATE OF DISCHARGE: 11/18/2016 PRIMARY CARE PHYSICIAN: Rusty Aus, MD   ADMISSION DIAGNOSIS:  Lymphadenopathy [R59.1] Left flank pain [R10.9] Acute deep vein thrombosis (DVT) of left lower extremity, unspecified vein (HCC) [I82.402] DISCHARGE DIAGNOSIS:  Active Problems:   DVT (deep venous thrombosis) (HCC)   Gross hematuria   Acute blood loss anemia   Other hydronephrosis   Malnutrition of moderate degree  SECONDARY DIAGNOSIS:   Past Medical History:  Diagnosis Date  . Anemia   . Anxiety   . Arthritis   . Cancer [facility])    GIST  . Depression   . GERD (gastroesophageal reflux disease)   . GIST (gastrointestinal stroma tumor), malignant, colon ([place])   . History of hiatal hernia   . History of kidney stones   . Hypertension    NO MEDS NOW  . IBS (irritable bowel syndrome)   . 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   1. Left lower extremity DVT, nonocclusive in the common femoral vein and occlusive in the posterior tibial vein:due to underlying malignancy Due to anemia and possible need for Bilateral nephrostomy tubes, Eliquis was discontinued and restarted heparin treatment. She can not have IVCF due to underlying cancer and location on IVC. Heparin drip was discontinued due to bladder hematoma,hematuria and decreased hemoglobin. Resumed Eliquis per Dr. Erlene Quan. Gross hematuria-resolved. Eliquis 5 mg po daily per Dr. Rogue Bussing.  2. Acute on chronic blood loss anemia with recent diagnosis of bladder tumor status post TURBT: Status post 1 unit PRBC 8/19  and 1 unit on 8/23 and 1 unit on 8/4. Hemoglobin dropped to 7.3 and got 1 unit of PRBC on 8/26. Hemoglobin 9.9.  3.history of GISTT and bladder tumor:  Appreciate urology and oncology evaluation Diagnosis is still unknown. Pathology is pending. Patient had bilateral stents due to bilateral hydronephrosis, which is removed. S/P bilateral nephrostomy tubes by interventional radiologist this am. S/p Cystoscopy with removal of bilateral double-J stents, clot evacuation with fulguration, and transurethral resection of a large bladder tumor on 8/25. Follow-up with urologist as outpatient.  Leukocytosis and UTI with positive urine culture STAPHYLOCOCCUS SPECIES (COAGULASE NEGATIVE). Bactrim bid for 5 days. Follow up CBC as outpatient.  4.hypokalemia: improved with potassium supplement.  Hypomagnesemia. Given IV magnesium and improved.  5. Delirium:Resolved.  Acute renal failure. Possible due to obstruction secondary to bilateral hematoma. Improved after above procedures.  Hyperkalemia. The patient was given 1 dose Kayexalate, improved.  Non-severe (moderate) malnutrition in context of chronic illness. Follow dietitian's recommendation.  Physical therapy evaluation: SNF. Waiting for insurance authorization for skilled nursing facility placement. The patient's sister wants the patient to be discharged home with home health. DISCHARGE CONDITIONS:  Stable, discharge to home with HHPT today. CONSULTS OBTAINED:  Treatment Team:  Heron Sabins, MD Hollice Espy, MD Cammie Sickle, MD Irine Seal, MD DRUG ALLERGIES:   Allergies  Allergen Reactions  . Other Swelling    Magic mouthwash  . Penicillins Swelling    Has patient had a PCN reaction causing immediate rash, facial/tongue/throat swelling,  SOB or lightheadedness with hypotension: No Has patient had a PCN reaction causing severe rash involving mucus membranes or skin necrosis: No Has patient had a PCN reaction  that required hospitalization: No Has patient had a PCN reaction occurring within the last 10 years: No If all of the above answers are "NO", then may proceed with Cephalosporin use.    . Ciprofloxacin Diarrhea    GI distress, abd cramping   DISCHARGE MEDICATIONS:   Allergies as of 11/18/2016      Reactions   Other Swelling   Magic mouthwash   Penicillins Swelling   Has patient had a PCN reaction causing immediate rash, facial/tongue/throat swelling, SOB or lightheadedness with hypotension: No Has patient had a PCN reaction causing severe rash involving mucus membranes or skin necrosis: No Has patient had a PCN reaction that required hospitalization: No Has patient had a PCN reaction occurring within the last 10 years: No If all of the above answers are "NO", then may proceed with Cephalosporin use.   Ciprofloxacin Diarrhea   GI distress, abd cramping      Medication List    TAKE these medications   alum & mag hydroxide-simeth 200-200-20 MG/5ML suspension Commonly known as:  MAALOX/MYLANTA Take 30 mLs by mouth every 4 (four) hours as needed for indigestion.   apixaban 5 MG Tabs tablet Commonly known as:  ELIQUIS Take 1 tablet (5 mg total) by mouth daily.   docusate sodium 100 MG capsule Commonly known as:  COLACE Take 1 capsule (100 mg total) by mouth 2 (two) times daily.   ferrous sulfate 325 (65 FE) MG tablet Take 1 tablet (325 mg total) by mouth 3 (three) times daily with meals.   gabapentin 100 MG capsule Commonly known as:  NEURONTIN Take 100 mg by mouth 3 (three) times daily.   megestrol 40 MG tablet Commonly known as:  MEGACE daily three days a week   multivitamin tablet Take 1 tablet by mouth daily.   ondansetron 4 MG tablet Commonly known as:  ZOFRAN Take 1 tablet (4 mg total) by mouth every 8 (eight) hours as needed for nausea or vomiting.   oxyCODONE 5 MG immediate release tablet Commonly known as:  Oxy IR/ROXICODONE Take 1 tablet (5 mg total) by  mouth every 6 (six) hours as needed for moderate pain or severe pain.   potassium chloride SA 20 MEQ tablet Commonly known as:  K-DUR,KLOR-CON Take 1 tablet (20 mEq total) by mouth daily.   PRESERVISION AREDS PO Take 2 tablets by mouth daily.   RA VITAMIN B-12 TR 1000 MCG Tbcr Generic drug:  Cyanocobalamin Take 2 tablets by mouth daily.   regorafenib 40 MG tablet Commonly known as:  STIVARGA Take 2 tablets (80 mg total) by mouth daily with breakfast on Day 1-21 of 28 Day cycle. Take with low fat meal.   sulfamethoxazole-trimethoprim 800-160 MG tablet Commonly known as:  BACTRIM DS,SEPTRA DS Take 1 tablet by mouth every 12 (twelve) hours.   VITAMIN D3 ADULT GUMMIES 1000 units Chew Generic drug:  Cholecalciferol Chew 1 each by mouth daily.   VITRON-C 65-125 MG Tabs Generic drug:  Iron-Vitamin C Take 2 tablets by mouth daily.   ZYRTEC PO Take 1 tablet by mouth daily.            Discharge Care Instructions        Start     Ordered   11/18/16 0000  apixaban (ELIQUIS) 5 MG TABS tablet  Daily     11/18/16  1305   11/17/16 0000  ferrous sulfate 325 (65 FE) MG tablet  3 times daily with meals     11/17/16 1018   11/17/16 0000  oxyCODONE (OXY IR/ROXICODONE) 5 MG immediate release tablet  Every 6 hours PRN     11/17/16 1018   11/17/16 0000  sulfamethoxazole-trimethoprim (BACTRIM DS,SEPTRA DS) 800-160 MG tablet  Every 12 hours     11/17/16 1018   11/17/16 0000  alum & mag hydroxide-simeth (MAALOX/MYLANTA) 200-200-20 MG/5ML suspension  Every 4 hours PRN     11/17/16 1018   11/17/16 0000  Increase activity slowly     11/17/16 1035   11/17/16 0000  Diet - low sodium heart healthy     11/17/16 1035   11/17/16 0000  ondansetron (ZOFRAN) 4 MG tablet  Every 8 hours PRN     11/17/16 1038   11/17/16 0000  docusate sodium (COLACE) 100 MG capsule  2 times daily     11/17/16 1038       DISCHARGE INSTRUCTIONS:  See AVS.  If you experience worsening of your admission symptoms,  develop shortness of breath, life threatening emergency, suicidal or homicidal thoughts you must seek medical attention immediately by calling 911 or calling your MD immediately  if symptoms less severe.  You Must read complete instructions/literature along with all the possible adverse reactions/side effects for all the Medicines you take and that have been prescribed to you. Take any new Medicines after you have completely understood and accpet all the possible adverse reactions/side effects.   Please note  You were cared for by a hospitalist during your hospital stay. If you have any questions about your discharge medications or the care you received while you were in the hospital after you are discharged, you can call the unit and asked to speak with the hospitalist on call if the hospitalist that took care of you is not available. Once you are discharged, your primary care physician will handle any further medical issues. Please note that NO REFILLS for any discharge medications will be authorized once you are discharged, as it is imperative that you return to your primary care physician (or establish a relationship with a primary care physician if you do not have one) for your aftercare needs so that they can reassess your need for medications and monitor your lab values.    On the day of Discharge:  VITAL SIGNS:  Blood pressure (!) 141/62, pulse 97, temperature 98.2 F (36.8 C), temperature source Oral, resp. rate 19, height 4\' 11"  (1.499 m), weight 124 lb (56.2 kg), SpO2 95 %. PHYSICAL EXAMINATION:  GENERAL:  76 y.o.-year-old patient lying in the bed with no acute distress.  EYES: Pupils equal, round, reactive to light and accommodation. No scleral icterus. Extraocular muscles intact.  HEENT: Head atraumatic, normocephalic. Oropharynx and nasopharynx clear.  NECK:  Supple, no jugular venous distention. No thyroid enlargement, no tenderness.  LUNGS: Normal breath sounds bilaterally, no  wheezing, rales,rhonchi or crepitation. No use of accessory muscles of respiration.  CARDIOVASCULAR: S1, S2 normal. No murmurs, rubs, or gallops.  ABDOMEN: Soft, non-tender, non-distended. Bowel sounds present. No organomegaly or mass.  EXTREMITIES: No pedal edema, cyanosis, or clubbing.  NEUROLOGIC: Cranial nerves II through XII are intact. Muscle strength 4/5 in all extremities. Sensation intact. Gait not checked.  PSYCHIATRIC: The patient is alert and oriented x 3.  SKIN: No obvious rash, lesion, or ulcer.  DATA REVIEW:   CBC  Recent Labs Lab 11/18/16 0846  WBC  15.3*  HGB 9.9*  HCT 28.6*  PLT 352    Chemistries   Recent Labs Lab 11/15/16 0409  11/17/16 0510  NA 137  < > 136  K 3.0*  < > 3.9  CL 108  < > 108  CO2 20*  < > 22  GLUCOSE 109*  < > 103*  BUN 39*  < > 17  CREATININE 1.27*  < > 0.59  CALCIUM 7.7*  < > 7.8*  MG 1.8  --   --   < > = values in this interval not displayed.   Microbiology Results  Results for orders placed or performed during the hospital encounter of 11/07/16  Urine culture     Status: None   Collection Time: 11/07/16 10:23 AM  Result Value Ref Range Status   Specimen Description URINE, RANDOM  Final   Special Requests NONE  Final   Culture   Final    NO GROWTH Performed at [facility], [place] [address] [place], [place] [address]    Report Status 11/08/2016 FINAL  Final  Urine Culture     Status: Abnormal   Collection Time: 11/12/16  6:00 PM  Result Value Ref Range Status   Specimen Description URINE, RANDOM  Final   Special Requests NONE  Final   Culture (A)  Final    >=100,000 COLONIES/mL STAPHYLOCOCCUS SPECIES (COAGULASE NEGATIVE)   Report Status 11/15/2016 FINAL  Final   Organism ID, Bacteria STAPHYLOCOCCUS SPECIES (COAGULASE NEGATIVE) (A)  Final      Susceptibility   Staphylococcus species (coagulase negative) - MIC*    CIPROFLOXACIN >=8 RESISTANT Resistant     GENTAMICIN <=0.5 SENSITIVE Sensitive     NITROFURANTOIN  <=16 SENSITIVE Sensitive     OXACILLIN >=4 RESISTANT Resistant     TETRACYCLINE <=1 SENSITIVE Sensitive     VANCOMYCIN 2 SENSITIVE Sensitive     TRIMETH/SULFA <=10 SENSITIVE Sensitive     CLINDAMYCIN <=0.25 SENSITIVE Sensitive     RIFAMPIN <=0.5 SENSITIVE Sensitive     Inducible Clindamycin NEGATIVE Sensitive     * >=100,000 COLONIES/mL STAPHYLOCOCCUS SPECIES (COAGULASE NEGATIVE)    RADIOLOGY:  No results found.   Management plans discussed with the patient, her husband, sisters, brother, and they are in agreement.  CODE STATUS: Full Code   TOTAL TIME TAKING CARE OF THIS PATIENT: 42 minutes.    Demetrios Loll M.D on 11/18/2016 at 1:28 PM  Between 7am to 6pm - Pager - [phone]  After 6pm go to [email or web address] - Proofreader  [facility]  Office  [phone]  CC: Primary care physician; Rusty Aus, MD   Note: This dictation was prepared with Dragon dictation along with smaller phrase technology. Any transcriptional errors that result from this process are unintentional.

## 2016-11-18 NOTE — Care Management (Addendum)
Patient has decided to return home home with home health services.  Patient sister, brother, and husband at bedside.  Patient provided home health agency preference. Patient states that she does not have a preference.  Referral made to [facility] with [facility] for RN, PT and SW.  Patient's son to transport at discharge.  CSW notified to cancel SNF authorization.  RNCM signing off.

## 2016-11-18 NOTE — Clinical Social Work Note (Signed)
RN CM has informed CSW that patient and family have chosen to return home and to cancel the SNF request. CSW has updated Santiago Glad at [facility]. Shela Leff MSW,LCSW [phone]

## 2016-11-18 NOTE — Clinical Social Work Note (Signed)
CSW has spoken to [facility] and they have sent patient's information to their medical director for review. RN CM has spoken to patient and family regarding the potential that home health may need to be arranged if insurance denies. Shela Leff MSW,LCSW [phone]

## 2016-11-18 NOTE — Progress Notes (Signed)
Pt discharged per MD order. Discharge paperwork reviewed with pt. Questions answered to pt and family satisfaction. Prescriptions given to pt. Education on nephrostomy care completed with pt demonstrating how to drain bag. IVs removed per order. Pt taken to car via wheelchair.

## 2016-11-18 NOTE — Progress Notes (Signed)
Courtney [name]   DOB:[birth year]   XA#:[id number]    Subjective: Patient anxious/eager to go home; does not want to go to rehabilitation.  Patient mild bleeding in the nephrostomy tubes. Complains of pain at the site of the tubes/lower abdomen.  Review of system:  Denies any chest pain or shortness of breath.  Objective:  Vitals:   11/17/16 2052 11/18/16 0425  BP: (!) 142/72 (!) 141/62  Pulse: (!) 103 97  Resp: 18 19  Temp: 98.3 F (36.8 C) 98.2 F (36.8 C)  SpO2: 97% 95%     Intake/Output Summary (Last 24 hours) at 11/18/16 1315 Last data filed at 11/18/16 0814  Gross per 24 hour  Intake            821.3 ml  Output             1220 ml  Net           -398.7 ml    GENERAL: Well-nourished well-developed; Thin built.. Alert, no distress and comfortable. Accompanied by her Sister. EYES: Positive for pallor. OROPHARYNX: no thrush or ulceration. NECK: supple, no masses felt LYMPH:  no palpable lymphadenopathy in the cervical, axillary or inguinal regions LUNGS: decreased breath sounds to auscultation at bases and  No wheeze or crackles HEART/CVS: regular rate & rhythm and no murmurs; swelling/ pain of Left LE.  ABDOMEN: abdomen soft, non-tender and normal bowel sounds;Bilateral percutaneous nephrostomy tubes- mild blood noted. Musculoskeletal:no cyanosis of digits and no clubbing  PSYCH: alert & oriented x 3  NEURO: no focal motor/sensory deficits SKIN:  no rashes or significant lesions   Labs:  Lab Results  Component Value Date   WBC 15.3 (H) 11/18/2016   HGB 9.9 (L) 11/18/2016   HCT 28.6 (L) 11/18/2016   MCV 94.3 11/18/2016   PLT 352 11/18/2016   NEUTROABS 6.3 11/07/2016    Lab Results  Component Value Date   NA 136 11/17/2016   K 3.9 11/17/2016   CL 108 11/17/2016   CO2 22 11/17/2016    Studies:  No results found.  Assessment & Plan:   # 76 year old female patient with history of Gist tumor abdomen/pelvis-currently in the hospital for left lower extremity  DVT  # Left lower extremity DVT- bleeding noted on anticoagulation with heparin. Hemoglobin stable/improved- 9.5 today. Currently on Eliquis 5 mg twice a day; given the mild bleeding/hematuria- recommend 5 mg once a day at discharge. Discussed with Dr. Bridgett Larsson.  # Hematuria- status post resection of the bladder tumor/cystoscopy; and also removal of the stents. Improvement of the creatinine. Decreased dose of Eliquis as discussed above  # Metastatic Gist tumor- most recently progressed on Sunitinib. Preliminary pathology on TURBT/cystoscopy positive for "high-grade malignancy-morphologically appears different from her previous Gist tumor". However, I clinically suspect this is recurrence of her previous Gist tumor. Awaiting pathology review at [facility] discussed with Dr.Onley. Prescription for Regorafenib initiated; approved. However awaiting on co-pays assistance.   # Severe anemia secondary to hematuria/anticoagulation- hemoglobin 9.5 status post transfusion; Monitor hemoglobin closely on anti-coagulation. Discussed regarding IV iron infusion at next visit.  # Debility/ help with ADLs- recommend home health.    Also spoke to patient's sister, Courtney [name] by the bedside.  Follow up with me in 1 week/mebane.   Cammie Sickle, MD 11/18/2016  1:15 PM

## 2016-11-19 NOTE — Telephone Encounter (Signed)
Received a call from [facility] wanting to know if they needed to fill patients [place]. Called [facility] to let them know that we are working with [facility] to get patients meds paid for. Her copay is $2690.43. They will not need to fill. Per Tiffany H. She will cancel the fill.   [place] Patient Advocate [phone] 11/19/2016 10:23 AM

## 2016-11-20 ENCOUNTER — Inpatient Hospital Stay: Payer: PPO | Admitting: Internal Medicine

## 2016-11-20 DIAGNOSIS — Z436 Encounter for attention to other artificial openings of urinary tract: Secondary | ICD-10-CM | POA: Diagnosis not present

## 2016-11-20 DIAGNOSIS — E441 Mild protein-calorie malnutrition: Secondary | ICD-10-CM | POA: Diagnosis not present

## 2016-11-20 DIAGNOSIS — K219 Gastro-esophageal reflux disease without esophagitis: Secondary | ICD-10-CM | POA: Diagnosis not present

## 2016-11-20 DIAGNOSIS — F419 Anxiety disorder, unspecified: Secondary | ICD-10-CM | POA: Diagnosis not present

## 2016-11-20 DIAGNOSIS — D414 Neoplasm of uncertain behavior of bladder: Secondary | ICD-10-CM | POA: Diagnosis not present

## 2016-11-20 DIAGNOSIS — K5792 Diverticulitis of intestine, part unspecified, without perforation or abscess without bleeding: Secondary | ICD-10-CM | POA: Diagnosis not present

## 2016-11-20 DIAGNOSIS — M1991 Primary osteoarthritis, unspecified site: Secondary | ICD-10-CM | POA: Diagnosis not present

## 2016-11-20 DIAGNOSIS — I82402 Acute embolism and thrombosis of unspecified deep veins of left lower extremity: Secondary | ICD-10-CM | POA: Diagnosis not present

## 2016-11-20 DIAGNOSIS — Z85038 Personal history of other malignant neoplasm of large intestine: Secondary | ICD-10-CM | POA: Diagnosis not present

## 2016-11-20 DIAGNOSIS — E785 Hyperlipidemia, unspecified: Secondary | ICD-10-CM | POA: Diagnosis not present

## 2016-11-20 DIAGNOSIS — F329 Major depressive disorder, single episode, unspecified: Secondary | ICD-10-CM | POA: Diagnosis not present

## 2016-11-20 DIAGNOSIS — M5030 Other cervical disc degeneration, unspecified cervical region: Secondary | ICD-10-CM | POA: Diagnosis not present

## 2016-11-20 DIAGNOSIS — D500 Iron deficiency anemia secondary to blood loss (chronic): Secondary | ICD-10-CM | POA: Diagnosis not present

## 2016-11-20 DIAGNOSIS — I10 Essential (primary) hypertension: Secondary | ICD-10-CM | POA: Diagnosis not present

## 2016-11-24 ENCOUNTER — Inpatient Hospital Stay: Payer: PPO | Admitting: Internal Medicine

## 2016-11-24 ENCOUNTER — Inpatient Hospital Stay: Payer: PPO | Attending: Internal Medicine | Admitting: Internal Medicine

## 2016-11-24 ENCOUNTER — Ambulatory Visit: Payer: PPO

## 2016-11-24 ENCOUNTER — Encounter: Payer: Self-pay | Admitting: Urology

## 2016-11-24 ENCOUNTER — Other Ambulatory Visit: Payer: Self-pay | Admitting: *Deleted

## 2016-11-24 ENCOUNTER — Ambulatory Visit (INDEPENDENT_AMBULATORY_CARE_PROVIDER_SITE_OTHER): Payer: PPO | Admitting: Urology

## 2016-11-24 ENCOUNTER — Inpatient Hospital Stay: Payer: PPO

## 2016-11-24 ENCOUNTER — Other Ambulatory Visit: Payer: PPO

## 2016-11-24 VITALS — BP 149/84 | HR 105 | Temp 97.4°F | Resp 18

## 2016-11-24 VITALS — BP 123/85 | HR 123 | Ht 59.0 in

## 2016-11-24 VITALS — BP 132/82 | HR 128 | Temp 97.8°F | Resp 22 | Ht 59.0 in

## 2016-11-24 DIAGNOSIS — C49A4 Gastrointestinal stromal tumor of large intestine: Secondary | ICD-10-CM | POA: Diagnosis not present

## 2016-11-24 DIAGNOSIS — Z881 Allergy status to other antibiotic agents status: Secondary | ICD-10-CM

## 2016-11-24 DIAGNOSIS — Z79899 Other long term (current) drug therapy: Secondary | ICD-10-CM

## 2016-11-24 DIAGNOSIS — Z87442 Personal history of urinary calculi: Secondary | ICD-10-CM

## 2016-11-24 DIAGNOSIS — F418 Other specified anxiety disorders: Secondary | ICD-10-CM

## 2016-11-24 DIAGNOSIS — D649 Anemia, unspecified: Secondary | ICD-10-CM

## 2016-11-24 DIAGNOSIS — C786 Secondary malignant neoplasm of retroperitoneum and peritoneum: Secondary | ICD-10-CM | POA: Diagnosis not present

## 2016-11-24 DIAGNOSIS — R110 Nausea: Secondary | ICD-10-CM | POA: Diagnosis not present

## 2016-11-24 DIAGNOSIS — Z7901 Long term (current) use of anticoagulants: Secondary | ICD-10-CM | POA: Diagnosis not present

## 2016-11-24 DIAGNOSIS — R630 Anorexia: Secondary | ICD-10-CM | POA: Diagnosis not present

## 2016-11-24 DIAGNOSIS — K589 Irritable bowel syndrome without diarrhea: Secondary | ICD-10-CM | POA: Diagnosis not present

## 2016-11-24 DIAGNOSIS — N179 Acute kidney failure, unspecified: Secondary | ICD-10-CM

## 2016-11-24 DIAGNOSIS — G8918 Other acute postprocedural pain: Secondary | ICD-10-CM | POA: Diagnosis not present

## 2016-11-24 DIAGNOSIS — E860 Dehydration: Secondary | ICD-10-CM | POA: Insufficient documentation

## 2016-11-24 DIAGNOSIS — R231 Pallor: Secondary | ICD-10-CM

## 2016-11-24 DIAGNOSIS — Z7952 Long term (current) use of systemic steroids: Secondary | ICD-10-CM | POA: Diagnosis not present

## 2016-11-24 DIAGNOSIS — N1339 Other hydronephrosis: Secondary | ICD-10-CM | POA: Diagnosis not present

## 2016-11-24 DIAGNOSIS — R531 Weakness: Secondary | ICD-10-CM | POA: Diagnosis not present

## 2016-11-24 DIAGNOSIS — G893 Neoplasm related pain (acute) (chronic): Secondary | ICD-10-CM | POA: Diagnosis not present

## 2016-11-24 DIAGNOSIS — R599 Enlarged lymph nodes, unspecified: Secondary | ICD-10-CM

## 2016-11-24 DIAGNOSIS — Z936 Other artificial openings of urinary tract status: Secondary | ICD-10-CM

## 2016-11-24 DIAGNOSIS — I82402 Acute embolism and thrombosis of unspecified deep veins of left lower extremity: Secondary | ICD-10-CM

## 2016-11-24 DIAGNOSIS — C670 Malignant neoplasm of trigone of bladder: Secondary | ICD-10-CM

## 2016-11-24 DIAGNOSIS — Z803 Family history of malignant neoplasm of breast: Secondary | ICD-10-CM

## 2016-11-24 DIAGNOSIS — C269 Malignant neoplasm of ill-defined sites within the digestive system: Secondary | ICD-10-CM

## 2016-11-24 DIAGNOSIS — C679 Malignant neoplasm of bladder, unspecified: Secondary | ICD-10-CM | POA: Insufficient documentation

## 2016-11-24 DIAGNOSIS — Z9889 Other specified postprocedural states: Secondary | ICD-10-CM | POA: Diagnosis not present

## 2016-11-24 DIAGNOSIS — Z880 Allergy status to penicillin: Secondary | ICD-10-CM

## 2016-11-24 DIAGNOSIS — I10 Essential (primary) hypertension: Secondary | ICD-10-CM

## 2016-11-24 DIAGNOSIS — K219 Gastro-esophageal reflux disease without esophagitis: Secondary | ICD-10-CM | POA: Diagnosis not present

## 2016-11-24 DIAGNOSIS — D500 Iron deficiency anemia secondary to blood loss (chronic): Secondary | ICD-10-CM

## 2016-11-24 DIAGNOSIS — R634 Abnormal weight loss: Secondary | ICD-10-CM

## 2016-11-24 LAB — CBC WITH DIFFERENTIAL/PLATELET
BASOS PCT: 0 %
Basophils Absolute: 0 10*3/uL (ref 0–0.1)
EOS ABS: 0 10*3/uL (ref 0–0.7)
EOS PCT: 0 %
HCT: 29.6 % — ABNORMAL LOW (ref 35.0–47.0)
Hemoglobin: 10.2 g/dL — ABNORMAL LOW (ref 12.0–16.0)
Lymphocytes Relative: 5 %
Lymphs Abs: 0.8 10*3/uL — ABNORMAL LOW (ref 1.0–3.6)
MCH: 32 pg (ref 26.0–34.0)
MCHC: 34.3 g/dL (ref 32.0–36.0)
MCV: 93.5 fL (ref 80.0–100.0)
MONOS PCT: 4 %
Monocytes Absolute: 0.6 10*3/uL (ref 0.2–0.9)
Neutro Abs: 13.2 10*3/uL — ABNORMAL HIGH (ref 1.4–6.5)
Neutrophils Relative %: 91 %
PLATELETS: 755 10*3/uL — AB (ref 150–440)
RBC: 3.17 MIL/uL — ABNORMAL LOW (ref 3.80–5.20)
RDW: 15.7 % — ABNORMAL HIGH (ref 11.5–14.5)
WBC: 14.6 10*3/uL — AB (ref 3.6–11.0)

## 2016-11-24 LAB — COMPREHENSIVE METABOLIC PANEL
ALT: 35 U/L (ref 14–54)
ANION GAP: 12 (ref 5–15)
AST: 33 U/L (ref 15–41)
Albumin: 2.5 g/dL — ABNORMAL LOW (ref 3.5–5.0)
Alkaline Phosphatase: 163 U/L — ABNORMAL HIGH (ref 38–126)
BILIRUBIN TOTAL: 0.9 mg/dL (ref 0.3–1.2)
BUN: 18 mg/dL (ref 6–20)
CALCIUM: 8.9 mg/dL (ref 8.9–10.3)
CO2: 20 mmol/L — ABNORMAL LOW (ref 22–32)
CREATININE: 0.76 mg/dL (ref 0.44–1.00)
Chloride: 96 mmol/L — ABNORMAL LOW (ref 101–111)
GFR calc Af Amer: >60 mL/min (ref >60)
Glucose, Bld: 98 mg/dL (ref 65–99)
Potassium: 4.4 mmol/L (ref 3.5–5.1)
Sodium: 128 mmol/L — ABNORMAL LOW (ref 135–145)
TOTAL PROTEIN: 6.5 g/dL (ref 6.5–8.1)

## 2016-11-24 LAB — SAMPLE TO BLOOD BANK

## 2016-11-24 MED ORDER — DEXAMETHASONE SODIUM PHOSPHATE 10 MG/ML IJ SOLN
INTRAMUSCULAR | Status: AC
  Filled 2016-11-24: qty 1

## 2016-11-24 MED ORDER — SODIUM CHLORIDE 0.9 % IV SOLN
Freq: Once | INTRAVENOUS | Status: AC
  Administered 2016-11-24: 10:00:00 via INTRAVENOUS
  Filled 2016-11-24: qty 1000

## 2016-11-24 MED ORDER — PREDNISONE 20 MG PO TABS: 20.0000 mg | ORAL_TABLET | Freq: Every day | ORAL | 0 refills | Status: DC

## 2016-11-24 MED ORDER — MORPHINE SULFATE 2 MG/ML IJ SOLN
2.0000 mg | Freq: Once | INTRAMUSCULAR | Status: AC
  Administered 2016-11-24: 2 mg via INTRAVENOUS

## 2016-11-24 MED ORDER — OXYCODONE-ACETAMINOPHEN 7.5-325 MG PO TABS: 1.0000 | ORAL_TABLET | ORAL | 0 refills | Status: DC | PRN

## 2016-11-24 MED ORDER — SODIUM CHLORIDE 0.9 % IV SOLN
12.0000 mg | Freq: Once | INTRAVENOUS | Status: AC
  Administered 2016-11-24: 10 mg via INTRAVENOUS
  Filled 2016-11-24: qty 1.2

## 2016-11-24 MED ORDER — ONDANSETRON HCL 4 MG/2ML IJ SOLN
INTRAMUSCULAR | Status: AC
  Filled 2016-11-24: qty 4

## 2016-11-24 MED ORDER — MORPHINE SULFATE (PF) 2 MG/ML IV SOLN
INTRAVENOUS | Status: AC
  Filled 2016-11-24: qty 1

## 2016-11-24 MED ORDER — SODIUM CHLORIDE 0.9 % IV SOLN
Freq: Once | INTRAVENOUS | Status: AC
  Administered 2016-11-24: 10:00:00 via INTRAVENOUS
  Filled 2016-11-24: qty 4

## 2016-11-24 NOTE — Progress Notes (Signed)
Patient presented to clinic today with intractable nausea and vomiting and chills (no fever). Patient states that she has had dry heaves all morning. She last took a zofran at 8 am today.  BP at 8 am this morning by home health nurse was 180/90 with HR of 106. pat states that she was in severe pain due to the abdominal cramping when the bp was taking. She has bilateral nephrostomy drains. Home Health nurse is managing the drains- per family, there has been adequate output.  Rates currently pain 3/10-left lower abd. Quadrant pain that radiates to her lower back. Pt has just completed her course of bactrim.  She is not eating/drinking fluids due to increase amount of nausea. Family states that pt is completely out of narcotics.

## 2016-11-24 NOTE — Assessment & Plan Note (Addendum)
#   Metastatic gist tumor small bowel- progression noted on Sutent- increasing retroperitoneal adenopathy/ invasion into the bladder [C discussion below]. Sutent currently discontinued.  # Recurrent/progressive gist-retropectoral lymph nodes/bladder; final pathology- from [facility] pending. Also ordered [facility] on testing. Proceed with Regorafenib- when available. Start at 80 mg a day- 3 weeks on 1 week off. Discussed the potential side effects including fatigue hypertension hand-foot syndrome. Awaiting co-pay assistance.   # Nausea and dry heaves- question iron pills. Discontinue iron pills; recommend IV iron next visit. Recommend today dexamethasone/Zofran.  # Pain- malignancy/bilateral nephrostomy tubes- recommend IV morphine 2 mg now. Recommend Percocet 7.5 mg 325 every 4 hours.  # Poor appetite/generalized weakness- for malignancy; anemia. Today hemoglobin 10. Recommend IV fluids; and prednisone 20 mg a day.  # Acute renal failure - likely secondary to bilateral ureteral obstruction- status post stent placement /explantation; currently status post percutaneous nephrostomy tubes. No bleeding noted. Creatinine normal.   # Left lower extremity DVT- on Eliquis 5 mg a day. No bleeding noted. No progression of DVT noted. We'll plan to increase the dose of Eliquis to 5 mg twice a day if hemoglobin is stable.  # Financial assistance - given a letter regarding diagnosis; treatment plan.   #  Follow-up in 1 week CBC/bmp/possible  Fluids.  encouraged the patient to keep appointment with urology this afternoon.   Addendum: The patient's co-pay assistance has been approved through the manufacturer; regorafenib should be shipped in the next one to 2 days.

## 2016-11-24 NOTE — Progress Notes (Signed)
Patient received hydration of 522ml with supportive IV medications of Decadron, Zofran and Morphine. At discharge patient reports feeling considerably better.

## 2016-11-24 NOTE — Telephone Encounter (Signed)
Oral Oncology Patient Advocote Encounter    Shirlean Mylar from [facility] faxed me back the paper work that Ms. Courtney [name] signed while at her visit today. I faxed it to [facility].

## 2016-11-24 NOTE — Progress Notes (Signed)
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 Known widely metastatic disease managed on Sutent per Hay[facility] Dage at [facility] center.  2 - Malignant Ureteral Obstruction - s/p bilateral neph tube placement 10/2016 from malignant obsruction as per above. Cr rise to 3's, now <1 with neph tubes.  Today "Courtney [name]" is seen in f/u above after TURBT x 2 last month. She is now neph tube dependant. She has had progressive failure to thrive, needing help with most ADL's at this point and wheelchair / bed bound.    PMH: Past Medical History:  Diagnosis Date  . Anemia   . Anxiety   . Arthritis   . Cancer [facility])    GIST  . Depression   . GERD (gastroesophageal reflux disease)   . GIST (gastrointestinal stroma tumor), malignant, colon ([place])   . History of hiatal hernia   . History of kidney stones   . Hypertension    NO MEDS NOW  . IBS (irritable bowel syndrome)   . Tremors of nervous system    HEAD    Surgical History: Past Surgical History:  Procedure Laterality Date  . ABDOMINAL HYSTERECTOMY    . BREAST BIOPSY Right   . CATARACT EXTRACTION W/PHACO Right 01/28/2015   Procedure: CATARACT EXTRACTION PHACO AND INTRAOCULAR LENS PLACEMENT (IOC);  Surgeon: Estill Cotta, MD;  Location: [facility] ORS;  Service: Ophthalmology;  Laterality: Right;  [name] 00:59.3 AP% 22.6 CDE 24.69 fluid pack lot #[id number]  . CATARACT EXTRACTION W/PHACO Left 02/11/2015   Procedure: CATARACT EXTRACTION PHACO AND INTRAOCULAR LENS PLACEMENT (IOC);  Surgeon: Estill Cotta, MD;  Location: [facility] ORS;  Service: Ophthalmology;  Laterality: Left;  [name] 01:18 AP%  22.8 CDE 32.38  fluid pack lot # [id number]  . CHOLECYSTECTOMY    . COLON SURGERY     COLOSTOMY AND REVERSAL  . CYSTOSCOPY W/ RETROGRADES Bilateral 11/02/2016   Procedure: CYSTOSCOPY WITH RETROGRADE PYELOGRAM;  Surgeon: Hollice Espy, MD;  Location: [facility] ORS;  Service: Urology;  Laterality: Bilateral;  . CYSTOSCOPY WITH RETROGRADE PYELOGRAM, URETEROSCOPY AND STENT PLACEMENT Bilateral 11/14/2016   Procedure: cystoscopy with clot evacuation,fulguration of bleeders, and removal of bilateral stentsTUR bladder tumor;  Surgeon: Irine Seal, MD;  Location: [facility] ORS;  Service: Urology;  Laterality: Bilateral;  . CYSTOSCOPY WITH STENT PLACEMENT Bilateral 11/02/2016   Procedure: CYSTOSCOPY WITH STENT PLACEMENT;  Surgeon: Hollice Espy, MD;  Location: [facility] ORS;  Service: Urology;  Laterality: Bilateral;  . IR NEPHROSTOMY PLACEMENT LEFT  11/14/2016  . IR NEPHROSTOMY PLACEMENT RIGHT  11/14/2016  . STOMACH SURGERY     CANCER  . TRANSURETHRAL RESECTION OF BLADDER TUMOR N/A 11/02/2016   Procedure: TRANSURETHRAL RESECTION OF BLADDER TUMOR (TURBT) >5CM;  Surgeon: Hollice Espy, MD;  Location: [facility] ORS;  Service: Urology;  Laterality: N/A;    Home Medications:  Allergies as of 11/24/2016      Reactions   Other Swelling   Magic mouthwash   Penicillins Swelling   Has patient had a PCN reaction causing immediate rash, facial/tongue/throat swelling, SOB or lightheadedness with hypotension: No Has patient had a PCN reaction causing severe rash involving mucus membranes or skin necrosis: No Has patient had a PCN reaction  that required hospitalization: No Has patient had a PCN reaction occurring within the last 10 years: No If all of the above answers are "NO", then may proceed with Cephalosporin use.   Ciprofloxacin Diarrhea   GI distress, abd cramping      Medication List       Accurate as of 11/24/16  9:42 AM. Always use your most recent med list.          alum & mag hydroxide-simeth 200-200-20 MG/5ML  suspension Commonly known as:  MAALOX/MYLANTA Take 30 mLs by mouth every 4 (four) hours as needed for indigestion.   apixaban 5 MG Tabs tablet Commonly known as:  ELIQUIS Take 1 tablet (5 mg total) by mouth daily.   docusate sodium 100 MG capsule Commonly known as:  COLACE Take 1 capsule (100 mg total) by mouth 2 (two) times daily.   ferrous sulfate 325 (65 FE) MG tablet Take 1 tablet (325 mg total) by mouth 3 (three) times daily with meals.   gabapentin 100 MG capsule Commonly known as:  NEURONTIN Take 100 mg by mouth 3 (three) times daily.   megestrol 40 MG tablet Commonly known as:  MEGACE daily three days a week   multivitamin tablet Take 1 tablet by mouth daily.   ondansetron 4 MG tablet Commonly known as:  ZOFRAN Take 1 tablet (4 mg total) by mouth every 8 (eight) hours as needed for nausea or vomiting.   oxyCODONE 5 MG immediate release tablet Commonly known as:  Oxy IR/ROXICODONE Take 1 tablet (5 mg total) by mouth every 6 (six) hours as needed for moderate pain or severe pain.   potassium chloride SA 20 MEQ tablet Commonly known as:  K-DUR,KLOR-CON Take 1 tablet (20 mEq total) by mouth daily.   PRESERVISION AREDS PO Take 2 tablets by mouth daily.   RA VITAMIN B-12 TR 1000 MCG Tbcr Generic drug:  Cyanocobalamin Take 2 tablets by mouth daily.   regorafenib 40 MG tablet Commonly known as:  STIVARGA Take 2 tablets (80 mg total) by mouth daily with breakfast on Day 1-21 of 28 Day cycle. Take with low fat meal.   sulfamethoxazole-trimethoprim 800-160 MG tablet Commonly known as:  BACTRIM DS,SEPTRA DS Take 1 tablet by mouth every 12 (twelve) hours.   VITAMIN D3 ADULT GUMMIES 1000 units Chew Generic drug:  Cholecalciferol Chew 1 each by mouth daily.   VITRON-C 65-125 MG Tabs Generic drug:  Iron-Vitamin C Take 2 tablets by mouth daily.   ZYRTEC PO Take 1 tablet by mouth daily.       Allergies:  Allergies  Allergen Reactions  . Other Swelling     Magic mouthwash  . Penicillins Swelling    Has patient had a PCN reaction causing immediate rash, facial/tongue/throat swelling, SOB or lightheadedness with hypotension: No Has patient had a PCN reaction causing severe rash involving mucus membranes or skin necrosis: No Has patient had a PCN reaction that required hospitalization: No Has patient had a PCN reaction occurring within the last 10 years: No If all of the above answers are "NO", then may proceed with Cephalosporin use.    . Ciprofloxacin Diarrhea    GI distress, abd cramping    Family History: Family History  Problem Relation Age of Onset  . Breast cancer Maternal Grandmother   . CAD Mother   . CAD Father     Social History:  reports that she has never smoked. She has never used smokeless tobacco. She reports that she does not  drink alcohol or use drugs.     Review of Systems  Gastrointestinal (upper)  : Negative for upper GI symptoms  Gastrointestinal (lower) : Negative for lower GI symptoms  Constitutional : Negative for symptoms  Skin: Negative for skin symptoms  Eyes: Negative for eye symptoms  Ear/Nose/Throat : Negative for Ear/Nose/Throat symptoms  Hematologic/Lymphatic: Negative for Hematologic/Lymphatic symptoms  Cardiovascular : Negative for cardiovascular symptoms  Respiratory : Negative for respiratory symptoms  Endocrine: Negative for endocrine symptoms  Musculoskeletal: Negative for musculoskeletal symptoms  Neurological: Negative for neurological symptoms  Psychologic: Negative for psychiatric symptoms   Physical Exam: There were no vitals taken for this visit.  Constitutional:  Alert and oriented, No acute distress. HEENT: [place] AT, moist mucus membranes.  Trachea midline, no masses. Cardiovascular: No clubbing, cyanosis, or edema. Respiratory: Normal respiratory effort, no increased work of breathing. GI: Abdomen is soft, nontender, nondistended, no abdominal  masses GU: No CVA tenderness.  Skin: No rashes, bruises or suspicious lesions. Lymph: No cervical or inguinal adenopathy. Neurologic: Grossly intact, no focal deficits, moving all 4 extremities. Psychiatric: Normal mood and affect.  Laboratory Data: Lab Results  Component Value Date   WBC 14.6 (H) 11/24/2016   HGB 10.2 (L) 11/24/2016   HCT 29.6 (L) 11/24/2016   MCV 93.5 11/24/2016   PLT 755 (H) 11/24/2016    Lab Results  Component Value Date   CREATININE 0.76 11/24/2016    No results found for: PSA  No results found for: TESTOSTERONE  No results found for: HGBA1C  Urinalysis    Component Value Date/Time   COLORURINE RED (A) 11/12/2016 1800   APPEARANCEUR CLOUDY (A) 11/12/2016 1800   APPEARANCEUR Clear 10/28/2016 1418   LABSPEC 1.031 (H) 11/12/2016 1800   LABSPEC 1.020 02/16/2014 0938   PHURINE  11/12/2016 1800    TEST NOT REPORTED DUE TO COLOR INTERFERENCE OF URINE PIGMENT   GLUCOSEU (A) 11/12/2016 1800    TEST NOT REPORTED DUE TO COLOR INTERFERENCE OF URINE PIGMENT   GLUCOSEU Negative 02/16/2014 0938   HGBUR (A) 11/12/2016 1800    TEST NOT REPORTED DUE TO COLOR INTERFERENCE OF URINE PIGMENT   BILIRUBINUR (A) 11/12/2016 1800    TEST NOT REPORTED DUE TO COLOR INTERFERENCE OF URINE PIGMENT   BILIRUBINUR Negative 10/28/2016 1418   BILIRUBINUR Negative 02/16/2014 0938   KETONESUR (A) 11/12/2016 1800    TEST NOT REPORTED DUE TO COLOR INTERFERENCE OF URINE PIGMENT   PROTEINUR (A) 11/12/2016 1800    TEST NOT REPORTED DUE TO COLOR INTERFERENCE OF URINE PIGMENT   NITRITE (A) 11/12/2016 1800    TEST NOT REPORTED DUE TO COLOR INTERFERENCE OF URINE PIGMENT   LEUKOCYTESUR (A) 11/12/2016 1800    TEST NOT REPORTED DUE TO COLOR INTERFERENCE OF URINE PIGMENT   LEUKOCYTESUR 1+ (A) 10/28/2016 1418   LEUKOCYTESUR Negative 02/16/2014 0938    Pertinent Imaging: CT reviewed as well as neph tube placement images.   Assessment & Plan:    1 - Metastatic Gastrointestinal Stromal  Tumor to Bladder  - I think best means of manaement in contiued neph tubes to prevent renal failure and then PRN resections for symptomatic bladder recurrence. I do NOT favor any sort of major pelvic exenteration as pt with progressive metastatic disease. Unfortnatley we have little to offer that will drastically change the course of disease. Should this consider to progress, palliative transition likely warranted.   2 - Malignant Ureteral Obstruction - continue neph tubes, these will likley be necessary for life.  RTC  Urol PRN per pt / family request.   Alexis Frock, [place] [address], [place], [place] [address] [phone]

## 2016-11-24 NOTE — Progress Notes (Signed)
OFFICE PROGRESS NOTE  Patient Care Team: Rusty Aus, MD as PCP - General (Internal Medicine) Hollice Espy, MD as Consulting Physician (Urology)  No matching staging information was found for the patient.   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 Biopsy of the mass showed a CD117 positive spindle cell tumor consistent with a GIST; 02/21/14 showed primary mass in mesentery with a peritoneal nodule in right posterior pelvis and nodule in pelvic cul de sac] ; Surgery April-May 2017 @[facility]   # GIST-June 2016- on Gleevec 337m alternating with 400  # FEB 2017- CT marked Progression of RP/pelvic sidewall LN; new paraesophageal LN; March 2017- Started Sutent 25 mg 3W-On 7 one week OFF [sec to SEs]  # July 27th PET- Significant PR; Sutent 2 w-On & 1 w OFF;  CT 11/27-STABLE DISEASE.   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 Prognostic/predictive value]     Malignant neoplasm of gastrointestinal tract ([place]   04/04/2014 Initial Diagnosis    Malignant neoplasm of gastrointestinal tract ([facility])        INTERVAL HISTORY:  SAowyn [name] 76y.o.  female pleasant patient above history of gist metastatic- Most recently on sunitinib- is just discharged from the hospital last week after complicated stay.  Patient had TURBT resection of bladder tumor; bilateral stent placement for acute renal failure/bilateral hydronephrosis approximately 2 weeks ago- she was admitted to the hospital for left lower extremity DVT. Patient started on anticoagulation- noted to have significant hematuria/bleeding into the bladder. Patient underwent a repeat TURBT reexcision of approximately 5 cm tumor. Patient also had percutaneous nephrostomy tubes placed; stents  taken out Patient then restarted back on anticoagulation with Eliquis. IVC filter could not be placed because of retroperitoneal adenopathy.   Patient overall feels poorly. She complains of pain in the back [in the site of the nephrostomy tubes]. Denies any blood in her nephrostomy tubes.   Extreme lack of energy. Complains of nausea; dry heaving. Poor appetite. Positive for weight loss. No fevers.  REVIEW OF SYSTEMS:  A complete 10 point review of system is done which is negative except mentioned above/history of present illness.   PAST MEDICAL HISTORY :  Past Medical History:  Diagnosis Date  . Anemia   . Anxiety   . Arthritis   . Cancer ([facility]    GIST  . Depression   . GERD (gastroesophageal reflux disease)   . GIST (gastrointestinal stroma tumor), malignant, colon ([place]   . History of hiatal hernia   . History of kidney stones   . Hypertension    NO MEDS NOW  . IBS (irritable bowel syndrome)   . Tremors of nervous system    HEAD    PAST SURGICAL HISTORY :   Past Surgical History:  Procedure Laterality Date  . ABDOMINAL HYSTERECTOMY    . BREAST BIOPSY Right   . CATARACT EXTRACTION W/PHACO Right 01/28/2015   Procedure: CATARACT EXTRACTION PHACO AND INTRAOCULAR LENS PLACEMENT (IOC);  Surgeon: SEstill Cotta MD;  Location: [facility] ORS;  Service: Ophthalmology;  Laterality: Right;  [place]:59.3 AP% 22.6 CDE 24.69 fluid pack lot ##[id number]  . CATARACT EXTRACTION W/PHACO Left 02/11/2015   Procedure: CATARACT EXTRACTION PHACO AND INTRAOCULAR LENS PLACEMENT (IOC);  Surgeon: SEstill Cotta  MD;  Location: [facility] ORS;  Service: Ophthalmology;  Laterality: Left;  [name] 01:18 AP% 22.8 CDE 32.38  fluid pack lot # [id number]  . CHOLECYSTECTOMY    . COLON SURGERY     COLOSTOMY AND REVERSAL  . CYSTOSCOPY W/ RETROGRADES Bilateral 11/02/2016   Procedure: CYSTOSCOPY WITH RETROGRADE PYELOGRAM;  Surgeon: Hollice Espy, MD;  Location: [facility] OR[facility];  Service: Urology;  Laterality: Bilateral;  .  CYSTOSCOPY WITH RETROGRADE PYELOGRAM, URETEROSCOPY AND STENT PLACEMENT Bilateral 11/14/2016   Procedure: cystoscopy with clot evacuation,fulguration of bleeders, and removal of bilateral stentsTUR bladder tumor;  Surgeon: Irine Seal, MD;  Location: [facility] OR[facility];  Service: Urology;  Laterality: Bilateral;  . CYSTOSCOPY WITH STENT PLACEMENT Bilateral 11/02/2016   Procedure: CYSTOSCOPY WITH STENT PLACEMENT;  Surgeon: Hollice Espy, MD;  Location: [facility] OR[facility];  Service: Urology;  Laterality: Bilateral;  . IR NEPHROSTOMY PLACEMENT LEFT  11/14/2016  . IR NEPHROSTOMY PLACEMENT RIGHT  11/14/2016  . STOMACH SURGERY     CANCER  . TRANSURETHRAL RESECTION OF BLADDER TUMOR N/A 11/02/2016   Procedure: TRANSURETHRAL RESECTION OF BLADDER TUMOR (TURBT) >5CM;  Surgeon: Hollice Espy, MD;  Location: [facility] ORS;  Service: Urology;  Laterality: N/A;    FAMILY HISTORY :   Family History  Problem Relation Age of Onset  . Breast cancer Maternal Grandmother   . CAD Mother   . CAD Father     SOCIAL HISTORY:   Social History  Substance Use Topics  . Smoking status: Never Smoker  . Smokeless tobacco: Never Used  . Alcohol use No    ALLERGIES:  is allergic to other; penicillins; and ciprofloxacin.  MEDICATIONS:  Current Outpatient Prescriptions  Medication Sig Dispense Refill  . apixaban (ELIQUIS) 5 MG TABS tablet Take 1 tablet (5 mg total) by mouth daily. 60 tablet 0  . ferrous sulfate 325 (65 FE) MG tablet Take 1 tablet (325 mg total) by mouth 3 (three) times daily with meals. 90 tablet 0  . gabapentin (NEURONTIN) 100 MG capsule Take 100 mg by mouth 3 (three) times daily.    . ondansetron (ZOFRAN) 4 MG tablet Take 1 tablet (4 mg total) by mouth every 8 (eight) hours as needed for nausea or vomiting. 20 tablet 0  . potassium chloride SA (K-DUR,KLOR-CON) 20 MEQ tablet Take 1 tablet (20 mEq total) by mouth daily. 30 tablet 0  . alum & mag hydroxide-simeth (MAALOX/MYLANTA) 200-200-20 MG/5ML suspension Take 30 mLs by  mouth every 4 (four) hours as needed for indigestion. (Patient not taking: Reported on 11/24/2016) 355 mL 0  . Cetirizine HCl (ZYRTEC PO) Take 1 tablet by mouth daily.     . Cholecalciferol (VITAMIN D3 ADULT GUMMIES) 1000 units CHEW Chew 1 each by mouth daily.    . Cyanocobalamin (RA VITAMIN B-12 TR) 1000 MCG TBCR Take 2 tablets by mouth daily.     Marland Kitchen docusate sodium (COLACE) 100 MG capsule Take 1 capsule (100 mg total) by mouth 2 (two) times daily. 20 capsule 0  . Iron-Vitamin C (VITRON-C) 65-125 MG TABS Take 2 tablets by mouth daily.     . megestrol (MEGACE) 40 MG tablet daily three days a week  2  . Multiple Vitamin (MULTIVITAMIN) tablet Take 1 tablet by mouth daily.    . Multiple Vitamins-Minerals (PRESERVISION AREDS PO) Take 2 tablets by mouth daily.    Marland Kitchen oxyCODONE-acetaminophen (PERCOCET) 7.5-325 MG tablet Take 1 tablet by mouth every 4 (four) hours as needed for severe pain. 90 tablet 0  . predniSONE (DELTASONE) 20 MG tablet  Take 1 tablet (20 mg total) by mouth daily with breakfast. Once a day with food x 10 days. 30 tablet 0  . regorafenib (STIVARGA) 40 MG tablet Take 2 tablets (80 mg total) by mouth daily with breakfast on Day 1-21 of 28 Day cycle. Take with low fat meal. 42 tablet 4  . sulfamethoxazole-trimethoprim (BACTRIM DS,SEPTRA DS) 800-160 MG tablet Take 1 tablet by mouth every 12 (twelve) hours. (Patient not taking: Reported on 11/24/2016) 8 tablet 0   No current facility-administered medications for this visit.     PHYSICAL EXAMINATION: ECOG PERFORMANCE STATUS: 0 - Asymptomatic  BP 132/82   Pulse (!) 128   Temp 97.8 F (36.6 C) (Tympanic)   Resp (!) 22   Ht 4' 11"  (1.499 m)   There were no vitals filed for this visit.  GENERAL: Thin build cachectic-appearing Caucasian female patient; accompanied by her son and daughter-in-law. She is in a wheelchair. EYES: Positive for pallor. No icterus. OROPHARYNX: no thrush or ulceration; poor dentition  NECK: supple, no masses  felt LYMPH:  no palpable lymphadenopathy in the cervical, axillary or inguinal regions LUNGS: clear to auscultation and  No wheeze or crackles HEART/CVS: regular rate & rhythm and no murmurs; No lower extremity edema ABDOMEN:abdomen soft, non-tender and normal bowel sounds; positive for bilateral nephrostomy tubes no bleeding. Musculoskeletal:no cyanosis of digits and no clubbing  PSYCH: alert & oriented x 3 with fluent speech NEURO: no focal motor/sensory deficits SKIN:  no rashes or significant lesions  LABORATORY DATA:  I have reviewed the data as listed    Component Value Date/Time   NA 128 (L) 11/24/2016 0844   NA 140 07/12/2014 0902   K 4.4 11/24/2016 0844   K 3.1 (L) 07/12/2014 0902   CL 96 (L) 11/24/2016 0844   CL 104 07/12/2014 0902   CO2 20 (L) 11/24/2016 0844   CO2 31 07/12/2014 0902   GLUCOSE 98 11/24/2016 0844   GLUCOSE 100 (H) 07/12/2014 0902   BUN 18 11/24/2016 0844   BUN 13 07/12/2014 0902   CREATININE 0.76 11/24/2016 0844   CREATININE 0.49 07/12/2014 0902   CALCIUM 8.9 11/24/2016 0844   CALCIUM 9.1 07/12/2014 0902   PROT 6.5 11/24/2016 0844   PROT 6.8 07/12/2014 0902   ALBUMIN 2.5 (L) 11/24/2016 0844   ALBUMIN 3.3 (L) 07/12/2014 0902   AST 33 11/24/2016 0844   AST 15 07/12/2014 0902   ALT 35 11/24/2016 0844   ALT 11 (L) 07/12/2014 0902   ALKPHOS 163 (H) 11/24/2016 0844   ALKPHOS 73 07/12/2014 0902   BILITOT 0.9 11/24/2016 0844   BILITOT 0.5 07/12/2014 0902   GFRNONAA >60 11/24/2016 0844   GFRNONAA >60 07/12/2014 0902   GFRAA >60 11/24/2016 0844   GFRAA >60 07/12/2014 0902    No results found for: SPEP, UPEP  Lab Results  Component Value Date   WBC 14.6 (H) 11/24/2016   NEUTROABS 13.2 (H) 11/24/2016   HGB 10.2 (L) 11/24/2016   HCT 29.6 (L) 11/24/2016   MCV 93.5 11/24/2016   PLT 755 (H) 11/24/2016      Chemistry      Component Value Date/Time   NA 128 (L) 11/24/2016 0844   NA 140 07/12/2014 0902   K 4.4 11/24/2016 0844   K 3.1 (L)  07/12/2014 0902   CL 96 (L) 11/24/2016 0844   CL 104 07/12/2014 0902   CO2 20 (L) 11/24/2016 0844   CO2 31 07/12/2014 0902   BUN 18 11/24/2016 0844  BUN 13 07/12/2014 0902   CREATININE 0.76 11/24/2016 0844   CREATININE 0.49 07/12/2014 0902      Component Value Date/Time   CALCIUM 8.9 11/24/2016 0844   CALCIUM 9.1 07/12/2014 0902   ALKPHOS 163 (H) 11/24/2016 0844   ALKPHOS 73 07/12/2014 0902   AST 33 11/24/2016 0844   AST 15 07/12/2014 0902   ALT 35 11/24/2016 0844   ALT 11 (L) 07/12/2014 0902   BILITOT 0.9 11/24/2016 0844   BILITOT 0.5 07/12/2014 0902     IMPRESSION: 1. New high-density material within the bladder consistent with bladder hematoma. Hematoma occupies approximate 60% of the bladder volume. Bladder mildly distended. 2. Bilateral ureteral stents in place.  Mild hydronephrosis. 3. Bulky periaortic and iliac adenopathy. 4. Suspicion for peritoneal implants in the LEFT upper quadrant. These results will be called to the ordering clinician or representative by the Radiologist Assistant, and communication documented in the PACS or zVision Dashboard.   Electronically Signed   By: Suzy Bouchard M.D.   On: 11/13/2016 10:56   RADIOGRAPHIC STUDIES: I have personally reviewed the radiological images as listed and agreed with the findings in the report. No results found.   ASSESSMENT & PLAN:  Malignant neoplasm of gastrointestinal tract ([place]) # Metastatic gist tumor small bowel- progression noted on Sutent- increasing retroperitoneal adenopathy/ invasion into the bladder [C discussion below]. Sutent currently discontinued.  # Recurrent/progressive gist-retropectoral lymph nodes/bladder; final pathology- from [facility] pending. Also ordered [facility] on testing. Proceed with Regorafenib- when available. Start at 80 mg a day- 3 weeks on 1 week off. Discussed the potential side effects including fatigue hypertension hand-foot syndrome. Awaiting co-pay  assistance.   # Nausea and dry heaves- question iron pills. Discontinue iron pills; recommend IV iron next visit. Recommend today dexamethasone/Zofran.  # Pain- malignancy/bilateral nephrostomy tubes- recommend IV morphine 2 mg now. Recommend Percocet 7.5 mg 325 every 4 hours.  # Poor appetite/generalized weakness- for malignancy; anemia. Today hemoglobin 10. Recommend IV fluids; and prednisone 20 mg a day.  # Acute renal failure - likely secondary to bilateral ureteral obstruction- status post stent placement /explantation; currently status post percutaneous nephrostomy tubes. No bleeding noted. Creatinine normal.   # Left lower extremity DVT- on Eliquis 5 mg a day. No bleeding noted. No progression of DVT noted. We'll plan to increase the dose of Eliquis to 5 mg twice a day if hemoglobin is stable.  # Financial assistance - given a letter regarding diagnosis; treatment plan.   #  Follow-up in 1 week CBC/bmp/possible  Fluids.  encouraged the patient to keep appointment with urology this afternoon.   Addendum: The patient's co-pay assistance has been approved through the manufacturer; regorafenib should be shipped in the next one to 2 days.   No orders of the defined types were placed in this encounter.     Cammie Sickle, MD 11/25/2016 7:41 PM

## 2016-11-25 ENCOUNTER — Other Ambulatory Visit: Payer: Self-pay | Admitting: *Deleted

## 2016-11-25 DIAGNOSIS — C269 Malignant neoplasm of ill-defined sites within the digestive system: Secondary | ICD-10-CM

## 2016-11-25 MED ORDER — REGORAFENIB 40 MG PO TABS: ORAL_TABLET | ORAL | 4 refills | Status: DC

## 2016-11-26 ENCOUNTER — Other Ambulatory Visit: Payer: Self-pay | Admitting: *Deleted

## 2016-11-26 DIAGNOSIS — C269 Malignant neoplasm of ill-defined sites within the digestive system: Secondary | ICD-10-CM

## 2016-11-26 MED ORDER — REGORAFENIB 40 MG PO TABS: ORAL_TABLET | ORAL | 4 refills | Status: DC

## 2016-11-27 ENCOUNTER — Telehealth: Payer: Self-pay | Admitting: *Deleted

## 2016-11-27 ENCOUNTER — Inpatient Hospital Stay
Admission: EM | Admit: 2016-11-27 | Discharge: 2016-11-30 | DRG: 947 | Disposition: A | Discharge status: Hospice | Payer: PPO | Attending: Internal Medicine | Admitting: Internal Medicine

## 2016-11-27 DIAGNOSIS — C49A4 Gastrointestinal stromal tumor of large intestine: Secondary | ICD-10-CM | POA: Diagnosis not present

## 2016-11-27 DIAGNOSIS — E43 Unspecified severe protein-calorie malnutrition: Secondary | ICD-10-CM | POA: Diagnosis not present

## 2016-11-27 DIAGNOSIS — Z7982 Long term (current) use of aspirin: Secondary | ICD-10-CM | POA: Diagnosis not present

## 2016-11-27 DIAGNOSIS — R109 Unspecified abdominal pain: Secondary | ICD-10-CM | POA: Diagnosis not present

## 2016-11-27 DIAGNOSIS — D649 Anemia, unspecified: Secondary | ICD-10-CM | POA: Diagnosis not present

## 2016-11-27 DIAGNOSIS — Z803 Family history of malignant neoplasm of breast: Secondary | ICD-10-CM | POA: Diagnosis not present

## 2016-11-27 DIAGNOSIS — D638 Anemia in other chronic diseases classified elsewhere: Secondary | ICD-10-CM | POA: Diagnosis not present

## 2016-11-27 DIAGNOSIS — Z79899 Other long term (current) drug therapy: Secondary | ICD-10-CM

## 2016-11-27 DIAGNOSIS — G893 Neoplasm related pain (acute) (chronic): Principal | ICD-10-CM | POA: Diagnosis present

## 2016-11-27 DIAGNOSIS — Z90710 Acquired absence of both cervix and uterus: Secondary | ICD-10-CM

## 2016-11-27 DIAGNOSIS — R64 Cachexia: Secondary | ICD-10-CM | POA: Diagnosis not present

## 2016-11-27 DIAGNOSIS — R102 Pelvic and perineal pain: Secondary | ICD-10-CM

## 2016-11-27 DIAGNOSIS — C49A3 Gastrointestinal stromal tumor of small intestine: Secondary | ICD-10-CM | POA: Diagnosis present

## 2016-11-27 DIAGNOSIS — Z7952 Long term (current) use of systemic steroids: Secondary | ICD-10-CM

## 2016-11-27 DIAGNOSIS — I10 Essential (primary) hypertension: Secondary | ICD-10-CM | POA: Diagnosis not present

## 2016-11-27 DIAGNOSIS — Z8500 Personal history of malignant neoplasm of unspecified digestive organ: Secondary | ICD-10-CM | POA: Diagnosis not present

## 2016-11-27 DIAGNOSIS — I824Z2 Acute embolism and thrombosis of unspecified deep veins of left distal lower extremity: Secondary | ICD-10-CM

## 2016-11-27 DIAGNOSIS — R52 Pain, unspecified: Secondary | ICD-10-CM | POA: Diagnosis present

## 2016-11-27 DIAGNOSIS — K625 Hemorrhage of anus and rectum: Secondary | ICD-10-CM | POA: Diagnosis not present

## 2016-11-27 DIAGNOSIS — K219 Gastro-esophageal reflux disease without esophagitis: Secondary | ICD-10-CM | POA: Diagnosis not present

## 2016-11-27 DIAGNOSIS — Z7901 Long term (current) use of anticoagulants: Secondary | ICD-10-CM

## 2016-11-27 DIAGNOSIS — Z9221 Personal history of antineoplastic chemotherapy: Secondary | ICD-10-CM

## 2016-11-27 DIAGNOSIS — Z8249 Family history of ischemic heart disease and other diseases of the circulatory system: Secondary | ICD-10-CM | POA: Diagnosis not present

## 2016-11-27 DIAGNOSIS — Z66 Do not resuscitate: Secondary | ICD-10-CM | POA: Diagnosis present

## 2016-11-27 DIAGNOSIS — Z515 Encounter for palliative care: Secondary | ICD-10-CM

## 2016-11-27 DIAGNOSIS — N939 Abnormal uterine and vaginal bleeding, unspecified: Secondary | ICD-10-CM | POA: Diagnosis not present

## 2016-11-27 DIAGNOSIS — R5383 Other fatigue: Secondary | ICD-10-CM

## 2016-11-27 DIAGNOSIS — Z436 Encounter for attention to other artificial openings of urinary tract: Secondary | ICD-10-CM

## 2016-11-27 DIAGNOSIS — M1990 Unspecified osteoarthritis, unspecified site: Secondary | ICD-10-CM

## 2016-11-27 DIAGNOSIS — C7911 Secondary malignant neoplasm of bladder: Secondary | ICD-10-CM | POA: Diagnosis present

## 2016-11-27 DIAGNOSIS — R319 Hematuria, unspecified: Secondary | ICD-10-CM

## 2016-11-27 DIAGNOSIS — C786 Secondary malignant neoplasm of retroperitoneum and peritoneum: Secondary | ICD-10-CM

## 2016-11-27 DIAGNOSIS — Z8509 Personal history of malignant neoplasm of other digestive organs: Secondary | ICD-10-CM | POA: Diagnosis not present

## 2016-11-27 DIAGNOSIS — K589 Irritable bowel syndrome without diarrhea: Secondary | ICD-10-CM | POA: Diagnosis not present

## 2016-11-27 DIAGNOSIS — S3722XA Contusion of bladder, initial encounter: Secondary | ICD-10-CM | POA: Diagnosis not present

## 2016-11-27 DIAGNOSIS — E871 Hypo-osmolality and hyponatremia: Secondary | ICD-10-CM | POA: Diagnosis not present

## 2016-11-27 LAB — COMPREHENSIVE METABOLIC PANEL
ALT: 51 U/L (ref 14–54)
ANION GAP: 11 (ref 5–15)
AST: 43 U/L — ABNORMAL HIGH (ref 15–41)
Albumin: 2.8 g/dL — ABNORMAL LOW (ref 3.5–5.0)
Alkaline Phosphatase: 137 U/L — ABNORMAL HIGH (ref 38–126)
BUN: 27 mg/dL — ABNORMAL HIGH (ref 6–20)
CHLORIDE: 97 mmol/L — AB (ref 101–111)
CO2: 23 mmol/L (ref 22–32)
Calcium: 9.7 mg/dL (ref 8.9–10.3)
Creatinine, Ser: 0.84 mg/dL (ref 0.44–1.00)
GFR calc non Af Amer: >60 mL/min (ref >60)
Glucose, Bld: 95 mg/dL (ref 65–99)
POTASSIUM: 4.4 mmol/L (ref 3.5–5.1)
SODIUM: 131 mmol/L — AB (ref 135–145)
Total Bilirubin: 0.9 mg/dL (ref 0.3–1.2)
Total Protein: 6.5 g/dL (ref 6.5–8.1)

## 2016-11-27 LAB — CBC
HCT: 28.4 % — ABNORMAL LOW (ref 35.0–47.0)
HEMOGLOBIN: 10 g/dL — AB (ref 12.0–16.0)
MCH: 33.2 pg (ref 26.0–34.0)
MCHC: 35.3 g/dL (ref 32.0–36.0)
MCV: 94 fL (ref 80.0–100.0)
Platelets: 748 10*3/uL — ABNORMAL HIGH (ref 150–440)
RBC: 3.03 MIL/uL — AB (ref 3.80–5.20)
RDW: 16 % — ABNORMAL HIGH (ref 11.5–14.5)
WBC: 10.3 10*3/uL (ref 3.6–11.0)

## 2016-11-27 MED ORDER — ACETAMINOPHEN 325 MG PO TABS: 650.0000 mg | ORAL_TABLET | Freq: Four times a day (QID) | ORAL | Status: DC | PRN

## 2016-11-27 MED ORDER — MORPHINE SULFATE (PF) 4 MG/ML IV SOLN
4.0000 mg | INTRAVENOUS | Status: DC | PRN
Start: 2016-11-27 — End: 2016-11-30
  Administered 2016-11-27 – 2016-11-29 (×3): 4 mg via INTRAVENOUS
  Filled 2016-11-27 (×4): qty 1

## 2016-11-27 MED ORDER — MORPHINE SULFATE (CONCENTRATE) 10 MG/0.5ML PO SOLN
5.0000 mg | ORAL | Status: DC | PRN
  Administered 2016-11-27 – 2016-11-28 (×5): 5 mg via ORAL
  Filled 2016-11-27 (×6): qty 1

## 2016-11-27 MED ORDER — LORAZEPAM 2 MG/ML PO CONC: 1.0000 mg | ORAL | Status: DC | PRN

## 2016-11-27 MED ORDER — SODIUM CHLORIDE 0.9 % IV SOLN: 250.0000 mL | INTRAVENOUS | Status: DC | PRN

## 2016-11-27 MED ORDER — OXYCODONE-ACETAMINOPHEN 5-325 MG PO TABS
1.0000 | ORAL_TABLET | Freq: Once | ORAL | Status: AC
  Administered 2016-11-27: 1 via ORAL
  Filled 2016-11-27: qty 1

## 2016-11-27 MED ORDER — SODIUM CHLORIDE 0.9% FLUSH
3.0000 mL | Freq: Two times a day (BID) | INTRAVENOUS | Status: DC
  Administered 2016-11-28 – 2016-11-30 (×5): 3 mL via INTRAVENOUS

## 2016-11-27 MED ORDER — OXYCODONE-ACETAMINOPHEN 5-325 MG PO TABS
1.0000 | ORAL_TABLET | Freq: Once | ORAL | Status: DC
  Filled 2016-11-27: qty 1

## 2016-11-27 MED ORDER — ONDANSETRON HCL 4 MG/2ML IJ SOLN
4.0000 mg | Freq: Once | INTRAMUSCULAR | Status: AC
  Administered 2016-11-27: 4 mg via INTRAVENOUS
  Filled 2016-11-27: qty 2

## 2016-11-27 MED ORDER — MORPHINE SULFATE (PF) 4 MG/ML IV SOLN
4.0000 mg | Freq: Once | INTRAVENOUS | Status: AC
  Administered 2016-11-27: 4 mg via INTRAVENOUS
  Filled 2016-11-27: qty 1

## 2016-11-27 MED ORDER — ACETAMINOPHEN 650 MG RE SUPP: 650.0000 mg | Freq: Four times a day (QID) | RECTAL | Status: DC | PRN

## 2016-11-27 MED ORDER — MORPHINE SULFATE (CONCENTRATE) 10 MG/0.5ML PO SOLN
5.0000 mg | ORAL | Status: DC | PRN
  Administered 2016-11-29 – 2016-11-30 (×3): 5 mg via SUBLINGUAL
  Filled 2016-11-27 (×2): qty 1

## 2016-11-27 MED ORDER — SODIUM CHLORIDE 0.9% FLUSH
3.0000 mL | INTRAVENOUS | Status: DC | PRN
  Filled 2016-11-27: qty 3

## 2016-11-27 MED ORDER — LORAZEPAM 2 MG/ML IJ SOLN: 1.0000 mg | INTRAMUSCULAR | Status: DC | PRN

## 2016-11-27 MED ORDER — ONDANSETRON 4 MG PO TBDP
4.0000 mg | ORAL_TABLET | Freq: Four times a day (QID) | ORAL | Status: DC | PRN
  Filled 2016-11-27: qty 1

## 2016-11-27 MED ORDER — ONDANSETRON HCL 4 MG/2ML IJ SOLN
4.0000 mg | Freq: Four times a day (QID) | INTRAMUSCULAR | Status: DC | PRN
Start: 2016-11-27 — End: 2016-11-30

## 2016-11-27 MED ORDER — LORAZEPAM 1 MG PO TABS: 1.0000 mg | ORAL_TABLET | ORAL | Status: DC | PRN

## 2016-11-27 NOTE — Consult Note (Signed)
[facility] NOTE  Patient Care Team: Rusty Aus, MD as PCP - General (Internal Medicine) Hollice Espy, MD as Consulting Physician (Urology)  CHIEF COMPLAINTS/PURPOSE OF CONSULTATION:  Metastatic GIST  HISTORY OF PRESENTING ILLNESS: Patient is drowsy/alone- oriented 2-3.   Courtney [name] 76 y.o.  female pleasant patient  history of gist metastatic- Most recently on sunitinib- was just discharged from the hospital last week after complicated stay.  Patient had TURBT resection of bladder tumor; bilateral stent placement for acute renal failure/bilateral hydronephrosis approximately 2 weeks ago- she was admitted to the hospital for left lower extremity DVT. Patient started on anticoagulation- noted to have significant hematuria/bleeding into the bladder. Patient underwent a repeat TURBT reexcision of approximately 5 cm tumor. Patient also had percutaneous nephrostomy tubes placed; stents taken out Patient then restarted back on anticoagulation with Eliquis. IVC filter could not be placed because of retroperitoneal adenopathy.   This morning she complained of worsening pain in the abdomen pelvis. Also noted to have blood in "vaginal/urethral bleeding ". She is currently in the emergency room.   Patient overall feels poorly. She complains of pain in the back [in the site of the nephrostomy tubes]. She also complains of worsening pain in the lower abdomen/pelvis. She states that she is not interested in further treatment options given the significant decline in quality of life/performance status.  ROS: Difficult to assess given her clinical condition.  MEDICAL HISTORY:  Past Medical History:  Diagnosis Date  . Anemia   . Anxiety   . Arthritis   . Cancer [facility])    GIST  . Depression   . GERD (gastroesophageal reflux disease)   . GIST (gastrointestinal stroma tumor), malignant, colon ([place])   . History of hiatal hernia   . History of kidney stones   .  Hypertension    NO MEDS NOW  . IBS (irritable bowel syndrome)   . Tremors of nervous system    HEAD    SURGICAL HISTORY: Past Surgical History:  Procedure Laterality Date  . ABDOMINAL HYSTERECTOMY    . BREAST BIOPSY Right   . CATARACT EXTRACTION W/PHACO Right 01/28/2015   Procedure: CATARACT EXTRACTION PHACO AND INTRAOCULAR LENS PLACEMENT (IOC);  Surgeon: Estill Cotta, MD;  Location: [facility] ORS;  Service: Ophthalmology;  Laterality: Right;  [name] 00:59.3 AP% 22.6 CDE 24.69 fluid pack lot #[id number]  . CATARACT EXTRACTION W/PHACO Left 02/11/2015   Procedure: CATARACT EXTRACTION PHACO AND INTRAOCULAR LENS PLACEMENT (IOC);  Surgeon: Estill Cotta, MD;  Location: [facility] ORS;  Service: Ophthalmology;  Laterality: Left;  [name] 01:18 AP% 22.8 CDE 32.38  fluid pack lot # [id number]  . CHOLECYSTECTOMY    . COLON SURGERY     COLOSTOMY AND REVERSAL  . CYSTOSCOPY W/ RETROGRADES Bilateral 11/02/2016   Procedure: CYSTOSCOPY WITH RETROGRADE PYELOGRAM;  Surgeon: Hollice Espy, MD;  Location: [facility] ORS;  Service: Urology;  Laterality: Bilateral;  . CYSTOSCOPY WITH RETROGRADE PYELOGRAM, URETEROSCOPY AND STENT PLACEMENT Bilateral 11/14/2016   Procedure: cystoscopy with clot evacuation,fulguration of bleeders, and removal of bilateral stentsTUR bladder tumor;  Surgeon: Irine Seal, MD;  Location: [facility] ORS;  Service: Urology;  Laterality: Bilateral;  . CYSTOSCOPY WITH STENT PLACEMENT Bilateral 11/02/2016   Procedure: CYSTOSCOPY WITH STENT PLACEMENT;  Surgeon: Hollice Espy, MD;  Location: [facility] ORS;  Service: Urology;  Laterality: Bilateral;  . IR NEPHROSTOMY PLACEMENT LEFT  11/14/2016  . IR NEPHROSTOMY PLACEMENT RIGHT  11/14/2016  . STOMACH SURGERY     CANCER  . TRANSURETHRAL RESECTION OF  BLADDER TUMOR N/A 11/02/2016   Procedure: TRANSURETHRAL RESECTION OF BLADDER TUMOR (TURBT) >5CM;  Surgeon: Hollice Espy, MD;  Location: [facility] ORS;  Service: Urology;  Laterality: N/A;    SOCIAL HISTORY: Social History    Social History  . Marital status: Married    Spouse name: N/A  . Number of children: N/A  . Years of education: N/A   Occupational History  . Not on file.   Social History Main Topics  . Smoking status: Never Smoker  . Smokeless tobacco: Never Used  . Alcohol use No  . Drug use: No  . Sexual activity: Not on file   Other Topics Concern  . Not on file   Social History Narrative  . No narrative on file    FAMILY HISTORY: Family History  Problem Relation Age of Onset  . Breast cancer Maternal Grandmother   . CAD Mother   . CAD Father     ALLERGIES:  is allergic to other; penicillins; and ciprofloxacin.  MEDICATIONS:  Current Facility-Administered Medications  Medication Dose Route Frequency Provider Last Rate Last Dose  . 0.9 %  sodium chloride infusion  250 mL Intravenous PRN Sudini, Alveta Heimlich, MD      . acetaminophen (TYLENOL) tablet 650 mg  650 mg Oral Q6H PRN Sudini, Alveta Heimlich, MD       Or  . acetaminophen (TYLENOL) suppository 650 mg  650 mg Rectal Q6H PRN Sudini, Srikar, MD      . LORazepam (ATIVAN) tablet 1 mg  1 mg Oral Q4H PRN Hillary Bow, MD       Or  . LORazepam (ATIVAN) 2 MG/ML concentrated solution 1 mg  1 mg Sublingual Q4H PRN Hillary Bow, MD       Or  . LORazepam (ATIVAN) injection 1 mg  1 mg Intravenous Q4H PRN Sudini, Alveta Heimlich, MD      . morphine 4 MG/ML injection 4 mg  4 mg Intravenous Q2H PRN Sudini, Srikar, MD      . morphine CONCENTRATE 10 MG/0.5ML oral solution 5 mg  5 mg Oral Q2H PRN Sudini, Alveta Heimlich, MD       Or  . morphine CONCENTRATE 10 MG/0.5ML oral solution 5 mg  5 mg Sublingual Q2H PRN Sudini, Srikar, MD      . ondansetron (ZOFRAN-ODT) disintegrating tablet 4 mg  4 mg Oral Q6H PRN Sudini, Alveta Heimlich, MD       Or  . ondansetron (ZOFRAN) injection 4 mg  4 mg Intravenous Q6H PRN Sudini, Alveta Heimlich, MD      . oxyCODONE-acetaminophen (PERCOCET/ROXICET) 5-325 MG per tablet 1 tablet  1 tablet Oral Once Lavonia Drafts, MD      . sodium chloride flush  (NS) 0.9 % injection 3 mL  3 mL Intravenous Q12H Sudini, Srikar, MD      . sodium chloride flush (NS) 0.9 % injection 3 mL  3 mL Intravenous PRN Hillary Bow, MD       Current Outpatient Prescriptions  Medication Sig Dispense Refill  . megestrol (MEGACE) 40 MG tablet daily three days a week  2  . potassium chloride SA (K-DUR,KLOR-CON) 20 MEQ tablet Take 1 tablet (20 mEq total) by mouth daily. 30 tablet 0  . predniSONE (DELTASONE) 20 MG tablet Take 1 tablet (20 mg total) by mouth daily with breakfast. Once a day with food x 10 days. 30 tablet 0  . alum & mag hydroxide-simeth (MAALOX/MYLANTA) 200-200-20 MG/5ML suspension Take 30 mLs by mouth every 4 (four) hours as needed for indigestion. (Patient  not taking: Reported on 11/24/2016) 355 mL 0  . apixaban (ELIQUIS) 5 MG TABS tablet Take 1 tablet (5 mg total) by mouth daily. (Patient not taking: Reported on 11/27/2016) 60 tablet 0  . docusate sodium (COLACE) 100 MG capsule Take 1 capsule (100 mg total) by mouth 2 (two) times daily. (Patient not taking: Reported on 11/27/2016) 20 capsule 0  . ferrous sulfate 325 (65 FE) MG tablet Take 1 tablet (325 mg total) by mouth 3 (three) times daily with meals. (Patient not taking: Reported on 11/27/2016) 90 tablet 0  . Iron-Vitamin C (VITRON-C) 65-125 MG TABS Take 2 tablets by mouth daily.     . ondansetron (ZOFRAN) 4 MG tablet Take 1 tablet (4 mg total) by mouth every 8 (eight) hours as needed for nausea or vomiting. 20 tablet 0  . oxyCODONE-acetaminophen (PERCOCET) 7.5-325 MG tablet Take 1 tablet by mouth every 4 (four) hours as needed for severe pain. 90 tablet 0  . regorafenib (STIVARGA) 40 MG tablet Take 4 tablets (160 mg total) by mouth daily with breakfast on Day 1-21 of 28 Day cycle. Take with low fat meal. (Patient not taking: Reported on 11/27/2016) 84 tablet 4  . sulfamethoxazole-trimethoprim (BACTRIM DS,SEPTRA DS) 800-160 MG tablet Take 1 tablet by mouth every 12 (twelve) hours. (Patient not taking: Reported on  11/24/2016) 8 tablet 0      .  PHYSICAL EXAMINATION:  Vitals:   11/27/16 1500 11/27/16 1730  BP: (!) 158/73 (!) 166/85  Pulse: (!) 104 100  Resp: (!) 22 17  Temp:    SpO2: 100% 100%   Filed Weights   11/27/16 1202  Weight: 125 lb (56.7 kg)    GENERAL: Thin build cachectic-appearing Caucasian female patient; she is alone. She is resting on a stretcher in the emergency room. EYES: Positive for pallor. No icterus. OROPHARYNX: no thrush or ulceration; poor dentition  NECK: supple, no masses felt LYMPH:  no palpable lymphadenopathy in the cervical, axillary or inguinal regions LUNGS: clear to auscultation and  No wheeze or crackles HEART/CVS: regular rate & rhythm and no murmurs; No lower extremity edema ABDOMEN:abdomen soft, non-tender and normal bowel sounds; positive for bilateral nephrostomy tubes no bleeding. Musculoskeletal:no cyanosis of digits and no clubbing  PSYCH: alert & oriented x 1-2; patient is drowsy. Easily arousable. NEURO: no focal motor/sensory deficits SKIN:  no rashes or significant lesions LABORATORY DATA:  I have reviewed the data as listed Lab Results  Component Value Date   WBC 10.3 11/27/2016   HGB 10.0 (L) 11/27/2016   HCT 28.4 (L) 11/27/2016   MCV 94.0 11/27/2016   PLT 748 (H) 11/27/2016    Recent Labs  11/07/16 1023  11/17/16 0510 11/24/16 0844 11/27/16 1204  NA 135  < > 136 128* 131*  K 3.1*  < > 3.9 4.4 4.4  CL 101  < > 108 96* 97*  CO2 24  < > 22 20* 23  GLUCOSE 100*  < > 103* 98 95  BUN 15  < > 17 18 27*  CREATININE 1.07*  < > 0.59 0.76 0.84  CALCIUM 8.4*  < > 7.8* 8.9 9.7  GFRNONAA 49*  < > >60 >60 >60  GFRAA 57*  < > >60 >60 >60  PROT 6.1*  --   --  6.5 6.5  ALBUMIN 3.3*  --   --  2.5* 2.8*  AST 27  --   --  33 43*  ALT 27  --   --  35 51  ALKPHOS 49  --   --  163* 137*  BILITOT 0.8  --   --  0.9 0.9  < > = values in this interval not displayed.  RADIOGRAPHIC STUDIES: I have personally reviewed the radiological images as  listed and agreed with the findings in the report. Ct Abdomen Pelvis Wo Contrast  Result Date: 11/13/2016 CLINICAL DATA:  Hematuria. Falling hemoglobin. Ureteral stents in place. EXAM: CT ABDOMEN AND PELVIS WITHOUT CONTRAST TECHNIQUE: Multidetector CT imaging of the abdomen and pelvis was performed following the standard protocol without IV contrast. COMPARISON:  CT 11/07/2016 FINDINGS: Lower chest: Nodule in the RIGHT lower lobe measures 9 mm (image 6, series 4). This was not imaged on comparison exam. Hepatobiliary: No focal hepatic lesion on noncontrast exam. Postcholecystectomy Pancreas: Pancreas is normal. No ductal dilatation. No pancreatic inflammation. Spleen: Normal spleen Adrenals/urinary tract: Adrenal glands are normal. Bilateral ureteral stents in place. Mild hydronephrosis similar to comparison exam. Ureteral stents terminate in the bladder. There is high-density material filling the bladder. The bladder is mildly distended. Approximately 60% bladder is filled with the high density material presumed hemorrhage. Some thickening along the posterior wall again noted. Stomach/Bowel: Moderate hiatal hernia. Small bowel anastomosis in the mid abdomen. No high-grade obstruction. Contrast transits into the ascending colon. Appendix normal. Transverse and descending colon normal. Rectum appears normal. Vascular/Lymphatic: Periaortic retroperitoneal adenopathy again noted with masslike adenopathy at the level of the renal veins. Low-density external iliac adenopathy again noted greater on the LEFT measuring up to 2.5 cm short axis. LEFT inguinal adenopathy also again noted. Reproductive: Post hysterectomy Other: Peritoneal implant in the LEFT upper quadrant measuring 1.5 cm (image 21, series 3) again noted. Small implant inferior to the lower pole of LEFT kidney measuring 10 mm . Musculoskeletal: No aggressive osseous lesion. IMPRESSION: 1. New high-density material within the bladder consistent with bladder  hematoma. Hematoma occupies approximate 60% of the bladder volume. Bladder mildly distended. 2. Bilateral ureteral stents in place.  Mild hydronephrosis. 3. Bulky periaortic and iliac adenopathy. 4. Suspicion for peritoneal implants in the LEFT upper quadrant. These results will be called to the ordering clinician or representative by the Radiologist Assistant, and communication documented in the PACS or zVision Dashboard. Electronically Signed   By: Suzy Bouchard M.D.   On: 11/13/2016 10:56   Ct Head Wo Contrast  Result Date: 11/09/2016 CLINICAL DATA:  Altered mental status EXAM: CT HEAD WITHOUT CONTRAST TECHNIQUE: Contiguous axial images were obtained from the base of the skull through the vertex without intravenous contrast. COMPARISON:  02/16/2014 FINDINGS: Brain: No acute intracranial abnormality. Specifically, no hemorrhage, hydrocephalus, mass lesion, acute infarction, or significant intracranial injury. There is atrophy and chronic small vessel disease changes. Vascular: No hyperdense vessel or unexpected calcification. Skull: No acute calvarial abnormality. Sinuses/Orbits: Visualized paranasal sinuses and mastoids clear. Orbital soft tissues unremarkable. Other: None IMPRESSION: No acute intracranial abnormality. Atrophy, chronic microvascular disease. Electronically Signed   By: Rolm Baptise M.D.   On: 11/09/2016 10:03   US Pelvis Limited  Result Date: 11/13/2016 CLINICAL DATA:  Hematuria. EXAM: LIMITED ULTRASOUND OF PELVIS TECHNIQUE: Limited transabdominal ultrasound examination of the pelvis was performed. COMPARISON:  GE CT from 11/13/2016 FINDINGS: The urinary bladder is distended by a large complicated heterogeneous mass corresponding to the high density material identified on CT from today. Other findings:  None. IMPRESSION: 1. Large complex heterogeneous mass within the urinary bladder compatible with bladder hematoma. Electronically Signed   By: Kerby Moors M.D.   On: 11/13/2016  16:47    Ct Abdomen Pelvis W Contrast  Result Date: 11/07/2016 CLINICAL DATA:  Pt states she's been having back and abdominal pain, pain increases with movement, pt states last Monday bladder stints placed, hx epidural, hernia repair, GB, GERD, hyst. EXAM: CT ABDOMEN AND PELVIS WITH CONTRAST TECHNIQUE: Multidetector CT imaging of the abdomen and pelvis was performed using the standard protocol following bolus administration of intravenous contrast. CONTRAST:  58mL ISOVUE-300 IOPAMIDOL (ISOVUE-300) INJECTION 61% COMPARISON:  06/01/2016 FINDINGS: Lower chest: 4 mm nodule, left lower lobe, image 4, series 4, new since the prior CT. Minor dependent subsegmental atelectasis. No acute findings. Hepatobiliary: No focal liver abnormality is seen. Status post cholecystectomy. No biliary dilatation. Pancreas: Unremarkable. No pancreatic ductal dilatation or surrounding inflammatory changes. Spleen: Unremarkable. Adrenals/Urinary Tract: No adrenal masses. Bilateral ureteral stents appear well-positioned. Mild left and mild-to-moderate right renal collecting system dilation. Small low-density mass consistent with a cyst from the superior margin of the left kidney, measuring 7 mm. No other renal masses or lesions. No stones. There is irregular wall thickening of the posterior inferior bladder. Bladder is partly decompressed by Foley catheter. Stomach/Bowel: Moderate size hiatal hernia. Stomach otherwise unremarkable. There has been a previous partial colon resection. There are right and left: Bowel anastomosis staple lines. A few left colon diverticula noted. There is no evidence diverticulitis or other colonic inflammatory process. The small bowel is unremarkable. Normal appendix is visualized. There is a 16 mm nodule adjacent to the left transverse colon in the left upper quadrant, within the peritoneal cavity. Vascular/Lymphatic: There is bulky retroperitoneal adenopathy. Several reference measurements were made. An enlarged  node or confluence of lymph nodes surrounds the aorta at the level of the of the renal vessels. An aortocaval portion of this measures 2.4 cm in short axis. There are enlarged pelvic lymph nodes. Largest is a left external iliac chain node measuring 3.7 x 2.8 cm. There is an enlarged left inguinal lymph node measuring 1.6 cm in short axis. There is aortic and branch vessel atherosclerotic calcifications. No aneurysm or dissection. The inferior vena cava is compressed have the level of the retroperitoneal adenopathy. There is also compression of common iliac veins. There may be thrombus in both common femoral veins versus mixed unenhanced and enhanced blood. Reproductive: Status post hysterectomy. No adnexal masses. Other: Trace ascites is seen inferior to the pancreas and along the left anterior pararenal fascia, and collecting in the posterior pelvic recess. Musculoskeletal: No acute fracture. No osteoblastic or osteolytic lesions. IMPRESSION: 1. There is bulky retroperitoneal, pelvic and left inguinal adenopathy, new since the prior CT. Nodes compress the inferior vena cava and iliac veins. There may be thrombus in the common femoral veins. Consider follow-up bilateral lower extremity venous Doppler evaluation. 2. Bilateral ureteral stents are well positioned. There is mild to moderate right and mild left hydronephrosis. 3. Irregular wall thickening of the posterior and inferior bladder. This may be inflammatory or reflect neoplasm. 4. Moderate hiatal hernia. 5. 16 mm soft tissue nodule in the left upper quadrant anterior peritoneal cavity a reflect an enlarged lymph node or a neoplastic peritoneal deposit. 6. Trace ascites. 7. Aortic atherosclerosis. Electronically Signed   By: Lajean Manes M.D.   On: 11/07/2016 13:47   US Venous Img Lower Bilateral  Result Date: 11/07/2016 CLINICAL DATA:  Left leg pain. Possible femoral vein thrombosis on CT. EXAM: BILATERAL LOWER EXTREMITY VENOUS DOPPLER ULTRASOUND  TECHNIQUE: Gray-scale sonography with graded compression, as well as color Doppler and duplex ultrasound were performed  to evaluate the lower extremity deep venous systems from the level of the common femoral vein and including the common femoral, femoral, profunda femoral, popliteal and calf veins including the posterior tibial, peroneal and gastrocnemius veins when visible. The superficial great saphenous vein was also interrogated. Spectral Doppler was utilized to evaluate flow at rest and with distal augmentation maneuvers in the common femoral, femoral and popliteal veins. COMPARISON:  None. FINDINGS: RIGHT LOWER EXTREMITY Common Femoral Vein: No evidence of thrombus. Normal compressibility, respiratory phasicity and response to augmentation. Saphenofemoral Junction: No evidence of thrombus. Profunda Femoral Vein: No evidence of thrombus. Femoral Vein: No evidence of thrombus. Popliteal Vein: No evidence of thrombus. Calf Veins: No evidence of thrombus. Superficial Great Saphenous Vein: No evidence of thrombus. LEFT LOWER EXTREMITY Common Femoral Vein: Nonocclusive luminal clot is seen within the partially compressible vessel. There is neighboring bulky adenopathy in this patient with known malignancy. Saphenofemoral Junction: No evidence of thrombus. Profunda Femoral Vein: No evidence of thrombus. Femoral Vein: No evidence of thrombus. Popliteal Vein: No evidence of thrombus. Calf Veins: Thrombus within a posterior tibial vein, occlusive. Other Findings: Diffusely monophasic flow correlating with pelvic retroperitoneal adenopathy impinging on the iliac vein. IMPRESSION: 1. Confirmed left lower extremity DVT, nonocclusive in the common femoral vein and occlusive in the posterior tibial vein. 2. 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 Electronically Signed   By: Monte Fantasia M.D.   On: 11/07/2016 16:10   Ir Nephrostomy Placement Left  Result  Date: 11/14/2016 INDICATION: 76 year old with metastatic cancer and bladder involvement. Hydronephrosis and hematuria. Patient currently has bilateral ureter stents and plan for placement of bilateral nephrostomy tubes. EXAM: PLACEMENT OF LEFT PERCUTANEOUS NEPHROSTOMY TUBE WITH FLUOROSCOPY AND ULTRASOUND GUIDANCE PLACEMENT OF RIGHT PERCUTANEOUS NEPHROSTOMY TUBE WITH FLUOROSCOPY AND ULTRASOUND GUIDANCE COMPARISON:  None. MEDICATIONS: Clindamycin 300 mg; The antibiotic was administered in an appropriate time frame prior to skin puncture. ANESTHESIA/SEDATION: Fentanyl 75 mcg IV; Versed 3.0 mg IV Moderate Sedation Time:  70 minutes The patient was continuously monitored during the procedure by the interventional radiology nurse under my direct supervision. CONTRAST:  15 mL Isovue 300 - administered into the collecting system(s) FLUOROSCOPY TIME:  Fluoroscopy Time: 4 minutes 24 seconds (40 mGy). COMPLICATIONS: None immediate. PROCEDURE: Informed written consent was obtained from the patient after a thorough discussion of the procedural risks, benefits and alternatives. All questions were addressed. Maximal Sterile Barrier Technique was utilized including caps, mask, sterile gowns, sterile gloves, sterile drape, hand hygiene and skin antiseptic. A timeout was performed prior to the initiation of the procedure. Patient was placed prone. Both kidneys were identified with ultrasound. Both flanks were prepped and draped in sterile fashion. Maximal barrier sterile technique was utilized including caps, mask, sterile gowns, sterile gloves, sterile drape, hand hygiene and skin antiseptic. Left flank was anesthetized with 1% lidocaine. [place] needle was directed into left kidney lower pole calyx with ultrasound guidance. Percutaneous access in left kidney was difficult due to location of the kidney with relation to the ribs. Wire was advanced into the renal collecting system. Accustick dilator set was placed. Contrast  injection confirmed placement in the renal collecting system. A J wire was placed. The tract was dilated to accommodate a 10.2 [place] multipurpose drain. Drain was positioned in the renal pelvis. Additional contrast was injected to confirm placement. Catheter was sutured to the skin. Drain was placed to gravity bag. Attention directed to the right kidney. Skin was anesthetized  with 1% lidocaine in the right flank. 22 gauge needle was advanced into a lower pole calyx with ultrasound guidance. Wire was advanced into the collecting system. Accustick dilator set was placed. 10.2 [place] multipurpose drain was placed over a J wire without difficulty. Drain was placed in the renal pelvic region. Contrast injection confirmed placement in the renal collecting system. Catheter sutured to skin and attached to a gravity bag. Fluoroscopic and ultrasound images were taken and saved for documentation. FINDINGS: Mild to moderate hydronephrosis bilaterally. Nephrostomy tubes are in the renal pelvis bilaterally. IMPRESSION: Successful placement of bilateral percutaneous nephrostomy tubes with ultrasound and fluoroscopic guidance. Electronically Signed   By: Markus Daft M.D.   On: 11/14/2016 11:25   Ir Nephrostomy Placement Right  Result Date: 11/14/2016 INDICATION: 76 year old with metastatic cancer and bladder involvement. Hydronephrosis and hematuria. Patient currently has bilateral ureter stents and plan for placement of bilateral nephrostomy tubes. EXAM: PLACEMENT OF LEFT PERCUTANEOUS NEPHROSTOMY TUBE WITH FLUOROSCOPY AND ULTRASOUND GUIDANCE PLACEMENT OF RIGHT PERCUTANEOUS NEPHROSTOMY TUBE WITH FLUOROSCOPY AND ULTRASOUND GUIDANCE COMPARISON:  None. MEDICATIONS: Clindamycin 300 mg; The antibiotic was administered in an appropriate time frame prior to skin puncture. ANESTHESIA/SEDATION: Fentanyl 75 mcg IV; Versed 3.0 mg IV Moderate Sedation Time:  70 minutes The patient was continuously monitored during the procedure by the  interventional radiology nurse under my direct supervision. CONTRAST:  15 mL Isovue 300 - administered into the collecting system(s) FLUOROSCOPY TIME:  Fluoroscopy Time: 4 minutes 24 seconds (40 mGy). COMPLICATIONS: None immediate. PROCEDURE: Informed written consent was obtained from the patient after a thorough discussion of the procedural risks, benefits and alternatives. All questions were addressed. Maximal Sterile Barrier Technique was utilized including caps, mask, sterile gowns, sterile gloves, sterile drape, hand hygiene and skin antiseptic. A timeout was performed prior to the initiation of the procedure. Patient was placed prone. Both kidneys were identified with ultrasound. Both flanks were prepped and draped in sterile fashion. Maximal barrier sterile technique was utilized including caps, mask, sterile gowns, sterile gloves, sterile drape, hand hygiene and skin antiseptic. Left flank was anesthetized with 1% lidocaine. [place] needle was directed into left kidney lower pole calyx with ultrasound guidance. Percutaneous access in left kidney was difficult due to location of the kidney with relation to the ribs. Wire was advanced into the renal collecting system. Accustick dilator set was placed. Contrast injection confirmed placement in the renal collecting system. A J wire was placed. The tract was dilated to accommodate a 10.2 [place] multipurpose drain. Drain was positioned in the renal pelvis. Additional contrast was injected to confirm placement. Catheter was sutured to the skin. Drain was placed to gravity bag. Attention directed to the right kidney. Skin was anesthetized with 1% lidocaine in the right flank. 22 gauge needle was advanced into a lower pole calyx with ultrasound guidance. Wire was advanced into the collecting system. Accustick dilator set was placed. 10.2 [place] multipurpose drain was placed over a J wire without difficulty. Drain was placed in the renal pelvic region. Contrast  injection confirmed placement in the renal collecting system. Catheter sutured to skin and attached to a gravity bag. Fluoroscopic and ultrasound images were taken and saved for documentation. FINDINGS: Mild to moderate hydronephrosis bilaterally. Nephrostomy tubes are in the renal pelvis bilaterally. IMPRESSION: Successful placement of bilateral percutaneous nephrostomy tubes with ultrasound and fluoroscopic guidance. Electronically Signed   By: Markus Daft M.D.   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 Discussed with pathology again this evening- results on [facility] still pending. However given the rapid progression of disease/significant decline in performance status- I would recommend discontinuation of further therapy at this time. I would focus on pain control/see discussion below. We'll cancel the prescription for Stiv[place]a.   #  Intractable pain- patient currently on IV morphine; improved. Percocet as needed. Palliative care consultation pending    # Left lower extremity DVT- discontinue anticoagulation given the ongoing bleeding.  # I had a long discussion the patient regarding my recommendations for palliation symptoms/hospice. She agrees. Also discussed with patient sonGwyndolyn Saxon over the phone.  Thank you Dr. Darvin Neighbours for allowing me to participate in the care of your pleasant patient. Please do not hesitate to contact me with questions or concerns in the interim. Discussed with Dr.Sudini. Disposition likely to nursing home/hospice.  Cammie Sickle, MD 11/27/2016 6:10 PM

## 2016-11-27 NOTE — ED Triage Notes (Signed)
Pt came to ED via EMS from home c/o bleeding. Pt had surgery to remove tumors in bladder, pt family reports rectal bleeding, unsure where the bleeding is coming from. Pt has nephrostomy tube.

## 2016-11-27 NOTE — ED Provider Notes (Signed)
[facility] Emergency Department Provider Note   ____________________________________________    I have reviewed the triage vital signs and the nursing notes.   HISTORY  Chief Complaint Post-op Problem     HPI Courtney [name] is a 76 y.o. female who presents with complaints of rectal bleeding. Family reports they noted blood to be in her diaper. Patient has a somewhat Complicated history. Recently diagnosed with a DVT and has been restarted on eliquis however also has a known bladder hematoma secondary to procedure. Patient has nephrostomy tubes as well. She denies dizziness. She complains of severe pain, typically takes Percocet at home. No fevers or chills noted. [facility]   Past Medical History:  Diagnosis Date  . Anemia   . Anxiety   . Arthritis   . Cancer [facility])    GIST  . Depression   . GERD (gastroesophageal reflux disease)   . GIST (gastrointestinal stroma tumor), malignant, colon ([place])   . History of hiatal hernia   . History of kidney stones   . Hypertension    NO MEDS NOW  . IBS (irritable bowel syndrome)   . Tremors of nervous system    HEAD    Patient Active Problem List   Diagnosis Date Noted  . 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  11/24/2016  . Dehydration 11/24/2016  . Malnutrition of moderate degree 11/18/2016  . Iron deficiency anemia due to chronic blood loss 11/17/2016  . Other hydronephrosis   . Gross hematuria   . Acute blood loss anemia   . DVT (deep venous thrombosis) ([place]) 11/07/2016  . Acute renal failure ([place]) 10/27/2016  . ARF (acute renal failure) with tubular necrosis ([place]) 10/27/2016  . Diverticulitis 05/11/2014  . 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 Malignant neoplasm of gastrointestinal tract ([place]) 04/04/2014  . DDD (degenerative disc disease), cervical 11/14/2013  . Acid reflux 11/14/2013  . HLD (hyperlipidemia) 11/14/2013    Past Surgical History:   Procedure Laterality Date  . ABDOMINAL HYSTERECTOMY    . BREAST BIOPSY Right   . CATARACT EXTRACTION W/PHACO Right 01/28/2015   Procedure: CATARACT EXTRACTION PHACO AND INTRAOCULAR LENS PLACEMENT (IOC);  Surgeon: Estill Cotta, MD;  Location: [facility] ORS;  Service: Ophthalmology;  Laterality: Right;  [name] 00:59.3 AP% 22.6 CDE 24.69 fluid pack lot #[id number]  . CATARACT EXTRACTION W/PHACO Left 02/11/2015   Procedure: CATARACT EXTRACTION PHACO AND INTRAOCULAR LENS PLACEMENT (IOC);  Surgeon: Estill Cotta, MD;  Location: [facility] ORS;  Service: Ophthalmology;  Laterality: Left;  [name] 01:18 AP% 22.8 CDE 32.38  fluid pack lot # [id number]  . CHOLECYSTECTOMY    . COLON SURGERY     COLOSTOMY AND REVERSAL  . CYSTOSCOPY W/ RETROGRADES Bilateral 11/02/2016   Procedure: CYSTOSCOPY WITH RETROGRADE PYELOGRAM;  Surgeon: Hollice Espy, MD;  Location: [facility] ORS;  Service: Urology;  Laterality: Bilateral;  . CYSTOSCOPY WITH RETROGRADE PYELOGRAM, URETEROSCOPY AND STENT PLACEMENT Bilateral 11/14/2016   Procedure: cystoscopy with clot evacuation,fulguration of bleeders, and removal of bilateral stentsTUR bladder tumor;  Surgeon: Irine Seal, MD;  Location: [facility] ORS;  Service: Urology;  Laterality: Bilateral;  . CYSTOSCOPY WITH STENT PLACEMENT Bilateral 11/02/2016   Procedure: CYSTOSCOPY WITH STENT PLACEMENT;  Surgeon: Hollice Espy, MD;  Location: [facility] ORS;  Service: Urology;  Laterality: Bilateral;  . IR NEPHROSTOMY PLACEMENT LEFT  11/14/2016  . IR NEPHROSTOMY PLACEMENT RIGHT  11/14/2016  . STOMACH SURGERY     CANCER  . TRANSURETHRAL RESECTION OF BLADDER TUMOR N/A  11/02/2016   Procedure: TRANSURETHRAL RESECTION OF BLADDER TUMOR (TURBT) >5CM;  Surgeon: Hollice Espy, MD;  Location: [facility] ORS;  Service: Urology;  Laterality: N/A;    Prior to Admission medications   Medication Sig Start Date End Date Taking? Authorizing Provider  megestrol (MEGACE) 40 MG tablet daily three days a week 05/13/15  Yes [provider]  potassium chloride SA (K-DUR,KLOR-CON) 20 MEQ tablet Take 1 tablet (20 mEq total) by mouth daily. 05/03/15  Yes Choksi, Delorise Shiner, MD  predniSONE (DELTASONE) 20 MG tablet Take 1 tablet (20 mg total) by mouth daily with breakfast. Once a day with food x 10 days. 11/24/16  Yes Cammie Sickle, MD  alum & mag hydroxide-simeth (MAALOX/MYLANTA) 200-200-20 MG/5ML suspension Take 30 mLs by mouth every 4 (four) hours as needed for indigestion. Patient not taking: Reported on 11/24/2016 11/17/16   Demetrios Loll, MD  apixaban (ELIQUIS) 5 MG TABS tablet Take 1 tablet (5 mg total) by mouth daily. Patient not taking: Reported on 11/27/2016 11/18/16   Demetrios Loll, MD  docusate sodium (COLACE) 100 MG capsule Take 1 capsule (100 mg total) by mouth 2 (two) times daily. Patient not taking: Reported on 11/27/2016 11/17/16   Demetrios Loll, MD  ferrous sulfate 325 (65 FE) MG tablet Take 1 tablet (325 mg total) by mouth 3 (three) times daily with meals. Patient not taking: Reported on 11/27/2016 11/17/16   Demetrios Loll, MD  Iron-Vitamin C (VITRON-C) 65-125 MG TABS Take 2 tablets by mouth daily.     [provider]  ondansetron (ZOFRAN) 4 MG tablet Take 1 tablet (4 mg total) by mouth every 8 (eight) hours as needed for nausea or vomiting. 11/17/16   Demetrios Loll, MD  oxyCODONE-acetaminophen (PERCOCET) 7.5-325 MG tablet Take 1 tablet by mouth every 4 (four) hours as needed for severe pain. 11/24/16   Cammie Sickle, MD  regorafenib (STIVARGA) 40 MG tablet Take 4 tablets (160 mg total) by mouth daily with breakfast on Day 1-21 of 28 Day cycle. Take with low fat meal. Patient not taking: Reported on 11/27/2016 11/26/16   Cammie Sickle, MD  sulfamethoxazole-trimethoprim (BACTRIM DS,SEPTRA DS) 800-160 MG tablet Take 1 tablet by mouth every 12 (twelve) hours. Patient not taking: Reported on 11/24/2016 11/17/16   Demetrios Loll, MD     Allergies Other; Penicillins; and Ciprofloxacin  Family History  Problem Relation Age  of Onset  . Breast cancer Maternal Grandmother   . CAD Mother   . CAD Father     Social History Social History  Substance Use Topics  . Smoking status: Never Smoker  . Smokeless tobacco: Never Used  . Alcohol use No    Review of Systems  Constitutional: No fever/chills Eyes: No visual changes.  ENT: No sore throat. Cardiovascular: Denies chest pain. Respiratory: Denies shortness of breath. Gastrointestinal: Left-sided abdominal pain, chronic Genitourinary: Negative for dysuria. Musculoskeletal: Negative for back pain. Skin: Negative for rash. Neurological: Negative for headaches   ____________________________________________   PHYSICAL EXAM:  VITAL SIGNS: ED Triage Vitals  Enc Vitals Group     BP 11/27/16 1201 (!) 154/84     Pulse Rate 11/27/16 1201 100     Resp 11/27/16 1201 20     Temp 11/27/16 1201 98.5 F (36.9 C)     Temp src --      SpO2 11/27/16 1201 99 %     Weight 11/27/16 1202 56.7 kg (125 lb)     Height 11/27/16 1202 1.499 m (4\' 11" )  Head Circumference --      Peak Flow --      Pain Score 11/27/16 1316 8     Pain Loc --      Pain Edu? --      Excl. in [place]? --     Constitutional: Alert and oriented. Anxious Eyes: Conjunctivae are normal.   Nose: No congestion/rhinnorhea. Mouth/Throat: Mucous membranes are moist.    Cardiovascular: Tachycardia, regular rhythm. Grossly normal heart sounds.  Good peripheral circulation. Respiratory: Normal respiratory effort.  No retractions. Lungs CTAB. Gastrointestinal: Soft and nontender. No distention.  Brown stool, faintly guaiac positive on exam. Blood absorbed in diaper, suspect bladder source Genitourinary: deferred Musculoskeletal:Warm and well perfused Neurologic:  Normal speech and language. No gross focal neurologic deficits are appreciated.  Skin:  Skin is warm, dry and intact. No rash noted. Psychiatric: Mood and affect are normal. Speech and behavior are  normal.  ____________________________________________   LABS (all labs ordered are listed, but only abnormal results are displayed)  Labs Reviewed  COMPREHENSIVE METABOLIC PANEL - Abnormal; Notable for the following:       Result Value   Sodium 131 (*)    Chloride 97 (*)    BUN 27 (*)    Albumin 2.8 (*)    AST 43 (*)    Alkaline Phosphatase 137 (*)    All other components within normal limits  CBC - Abnormal; Notable for the following:    RBC 3.03 (*)    Hemoglobin 10.0 (*)    HCT 28.4 (*)    RDW 16.0 (*)    Platelets 748 (*)    All other components within normal limits  POC OCCULT BLOOD, ED  TYPE AND SCREEN   ____________________________________________  EKG  None ____________________________________________  RADIOLOGY  None ____________________________________________   PROCEDURES  Procedure(s) performed: No    Critical Care performed: No ____________________________________________   INITIAL IMPRESSION / ASSESSMENT AND PLAN / ED COURSE  Pertinent labs & imaging results that were available during my care of the patient were reviewed by me and considered in my medical decision making (see chart for details).  Patient presents with possible rectal bleeding. I suspect bleeding is from the bladder given history of hematoma on eliquis. Light brown stool, faintly guaiac positive on exam.  Patient given analgesics for her pain, but required additional IV doses as well. I will admit her to the hospitalist service for further evaluation    ____________________________________________   FINAL CLINICAL IMPRESSION(S) / ED DIAGNOSES  Final diagnoses:  Cancer associated pain      NEW MEDICATIONS STARTED DURING THIS VISIT:  New Prescriptions   No medications on file     Note:  This document was prepared using Dragon voice recognition software and may include unintentional dictation errors.    Lavonia Drafts, MD 11/27/16 [phone]

## 2016-11-27 NOTE — Clinical Social Work Note (Signed)
Clinical Social Work Assessment  Patient Details  Name: Courtney [name] MRN: [id number] Date of Birth: Jan 01, 1941  Date of referral:  11/27/16               Reason for consult:  Facility Placement                Permission sought to share information with:  Family Supports, Customer service manager Permission granted to share information::  Yes, Verbal Permission Granted  Name::     Natalea Sutliff [phone]  Agency::  SNF  Relationship::     Contact Information:     Housing/Transportation Living arrangements for the past 2 months:  Single Family Home Source of Information:  Patient, Adult Children, Spouse Patient Interpreter Needed:  None Criminal Activity/Legal Involvement Pertinent to Current Situation/Hospitalization:  No - Comment as needed Significant Relationships:  Adult Children, Siblings, Spouse Lives with:  Spouse, Self Do you feel safe going back to the place where you live?  No Need for family participation in patient care:  Yes (Comment)  Care giving concerns: Family would like this patient to go to SNF as they are unable to manage both pt and her (husband who had a stroke) at this time. They requested a palliative consult   Social Worker assessment / plan:  LCSW introduced myself to patient and her family and obtained verbal consent to complete assessment to identify her needs at this time. In consultation with EDP this patient has medical issues and will be admitted. Patient has had recent surgery and is bleeding out into her diaper. She has 2 drains. Patients family would like to have her placed in a SNF to deal with all medical issues and personal care at this time. It was explained she would need a 3  Night qualifying stay and for it to be covered by medicare. She also has Health [employer]vant[employer]. Patient is oriented x4 but becomes confused when on pain medications. Family has requested a palliative consult as well. Patient needs assistance with ADL's and is very  weak and unable to walk far.Her eye sight is good and her hearing is good and she can do verbal commands. Patient has had excellent family support, pts family comes inevery day in am and son and daughter in law come in 3-4 hours every night. They have a separate person to care for pts husband in evenings ( out of pocket) Home health was coming in daily for patient and family reports that is still not enough care at this time. LCSW to complete Fl2 and obtain passr. Information will go to HUB for future SNF.  Employment status:  Retired Forensic scientist:  Engineer, maintenance (IT) PT Recommendations:  [place] / Referral to community resources:  [place]  Patient/Family's Response to care: The would like to separate husband and pt as both have many different and high needs at this time  Patient/Family's Understanding of and Emotional Response to Diagnosis, Current Treatment, and Prognosis:  Patient understands she is not well and in pain. Family understanding that she wants to be comfortable and pain to be managed.  Emotional Assessment Appearance:  Appears stated age Attitude/Demeanor/Rapport:  Complaining (Reports she is in constant pain) Affect (typically observed):  Accepting, Appropriate Orientation:  Oriented to Self, Oriented to Place, Oriented to  Time, Oriented to Situation Alcohol / Substance use:  Not Applicable Psych involvement (Current and /or in the community):  No (Comment)  Discharge Needs  Concerns to be  addressed:  Adjustment to Illness Readmission within the last 30 days:  No Current discharge risk:  Chronically ill Barriers to Discharge:  Continued Medical Work up   Joana Reamer, LCSW 11/27/2016, 4:39 PM

## 2016-11-27 NOTE — Telephone Encounter (Signed)
Phone call to Pamala Hurry to inform of physician response to go to ED. She states she will let them know

## 2016-11-27 NOTE — NC FL2 (Signed)
[place] LEVEL OF CARE SCREENING TOOL     IDENTIFICATION  Patient Name: Courtney [name] Birthdate: 16-Oct-1940 Sex: female Admission Date (Current Location): 11/27/2016  [place] and [place] Number:  Engineering geologist and Address:  [facility], [address], [place], [place] [address]      Provider Number: [id number]  Attending Physician Name and Address:  Lavonia Drafts, MD  Relative Name and Phone Number:       Current Level of Care: Hospital Recommended Level of Care: [place] Prior Approval Number:    Date Approved/Denied:   PASRR Number: [id number]  Discharge Plan: SNF    Current Diagnoses: Patient Active Problem List   Diagnosis Date Noted  . Palliative care status 11/27/2016  . Inadequate pain control 11/27/2016  . DNR (do not resuscitate) 11/27/2016  . 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  11/24/2016  . Dehydration 11/24/2016  . Malnutrition of moderate degree 11/18/2016  . Iron deficiency anemia due to chronic blood loss 11/17/2016  . Other hydronephrosis   . Gross hematuria   . Acute blood loss anemia   . DVT (deep venous thrombosis) ([place]) 11/07/2016  . Acute renal failure ([place]) 10/27/2016  . ARF (acute renal failure) with tubular necrosis ([place]) 10/27/2016  . Diverticulitis 05/11/2014  . 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 Malignant neoplasm of gastrointestinal tract ([place]) 04/04/2014  . DDD (degenerative disc disease), cervical 11/14/2013  . Acid reflux 11/14/2013  . HLD (hyperlipidemia) 11/14/2013    Orientation RESPIRATION BLADDER Height & Weight     Self, Time, Situation, Place  Normal Incontinent Weight: 125 lb (56.7 kg) Height:  4\' 11"  (149.9 cm)  BEHAVIORAL SYMPTOMS/MOOD NEUROLOGICAL BOWEL NUTRITION STATUS      Incontinent Diet (heart healthy)  AMBULATORY STATUS COMMUNICATION OF NEEDS Skin   Limited Assist Verbally Normal, Other (Comment) (Drains)                     Personal Care Assistance Level of Assistance  Bathing, Feeding, Dressing Bathing Assistance: Limited assistance Feeding assistance: Independent Dressing Assistance: Limited assistance     Functional Limitations Info  Sight, Hearing, Speech Sight Info: Adequate Hearing Info: Adequate Speech Info: Adequate    SPECIAL CARE FACTORS FREQUENCY  PT (By licensed PT)                    Contractures Contractures Info: Not present    Additional Factors Info  Code Status, Allergies Code Status Info: DNR Allergies Info: Penicillan and CIPRO           Current Medications (11/27/2016):  This is the current hospital active medication list Current Facility-Administered Medications  Medication Dose Route Frequency Provider Last Rate Last Dose  . 0.9 %  sodium chloride infusion  250 mL Intravenous PRN Sudini, Alveta Heimlich, MD      . acetaminophen (TYLENOL) tablet 650 mg  650 mg Oral Q6H PRN Sudini, Alveta Heimlich, MD       Or  . acetaminophen (TYLENOL) suppository 650 mg  650 mg Rectal Q6H PRN Sudini, Srikar, MD      . LORazepam (ATIVAN) tablet 1 mg  1 mg Oral Q4H PRN Hillary Bow, MD       Or  . LORazepam (ATIVAN) 2 MG/ML concentrated solution 1 mg  1 mg Sublingual Q4H PRN Hillary Bow, MD       Or  . LORazepam (ATIVAN) injection 1 mg  1 mg Intravenous Q4H PRN Sudini, Artist,  MD      . morphine 4 MG/ML injection 4 mg  4 mg Intravenous Q2H PRN Sudini, Srikar, MD      . morphine CONCENTRATE 10 MG/0.5ML oral solution 5 mg  5 mg Oral Q2H PRN Sudini, Alveta Heimlich, MD       Or  . morphine CONCENTRATE 10 MG/0.5ML oral solution 5 mg  5 mg Sublingual Q2H PRN Sudini, Srikar, MD      . ondansetron (ZOFRAN-ODT) disintegrating tablet 4 mg  4 mg Oral Q6H PRN Sudini, Alveta Heimlich, MD       Or  . ondansetron (ZOFRAN) injection 4 mg  4 mg Intravenous Q6H PRN Sudini, Alveta Heimlich, MD      . oxyCODONE-acetaminophen (PERCOCET/ROXICET) 5-325 MG per tablet 1 tablet  1 tablet Oral Once Lavonia Drafts, MD      .  sodium chloride flush (NS) 0.9 % injection 3 mL  3 mL Intravenous Q12H Sudini, Srikar, MD      . sodium chloride flush (NS) 0.9 % injection 3 mL  3 mL Intravenous PRN Hillary Bow, MD       Current Outpatient Prescriptions  Medication Sig Dispense Refill  . megestrol (MEGACE) 40 MG tablet daily three days a week  2  . potassium chloride SA (K-DUR,KLOR-CON) 20 MEQ tablet Take 1 tablet (20 mEq total) by mouth daily. 30 tablet 0  . predniSONE (DELTASONE) 20 MG tablet Take 1 tablet (20 mg total) by mouth daily with breakfast. Once a day with food x 10 days. 30 tablet 0  . alum & mag hydroxide-simeth (MAALOX/MYLANTA) 200-200-20 MG/5ML suspension Take 30 mLs by mouth every 4 (four) hours as needed for indigestion. (Patient not taking: Reported on 11/24/2016) 355 mL 0  . apixaban (ELIQUIS) 5 MG TABS tablet Take 1 tablet (5 mg total) by mouth daily. (Patient not taking: Reported on 11/27/2016) 60 tablet 0  . docusate sodium (COLACE) 100 MG capsule Take 1 capsule (100 mg total) by mouth 2 (two) times daily. (Patient not taking: Reported on 11/27/2016) 20 capsule 0  . ferrous sulfate 325 (65 FE) MG tablet Take 1 tablet (325 mg total) by mouth 3 (three) times daily with meals. (Patient not taking: Reported on 11/27/2016) 90 tablet 0  . Iron-Vitamin C (VITRON-C) 65-125 MG TABS Take 2 tablets by mouth daily.     . ondansetron (ZOFRAN) 4 MG tablet Take 1 tablet (4 mg total) by mouth every 8 (eight) hours as needed for nausea or vomiting. 20 tablet 0  . oxyCODONE-acetaminophen (PERCOCET) 7.5-325 MG tablet Take 1 tablet by mouth every 4 (four) hours as needed for severe pain. 90 tablet 0  . regorafenib (STIVARGA) 40 MG tablet Take 4 tablets (160 mg total) by mouth daily with breakfast on Day 1-21 of 28 Day cycle. Take with low fat meal. (Patient not taking: Reported on 11/27/2016) 84 tablet 4  . sulfamethoxazole-trimethoprim (BACTRIM DS,SEPTRA DS) 800-160 MG tablet Take 1 tablet by mouth every 12 (twelve) hours. (Patient  not taking: Reported on 11/24/2016) 8 tablet 0     Discharge Medications: Please see discharge summary for a list of discharge medications.  Relevant Imaging Results:  Relevant Lab Results:   Additional Information ss: [id number]  Joana Reamer, LCSW

## 2016-11-27 NOTE — Progress Notes (Signed)
[place] responded to an OR for End of Life. Pt presented confused and sedated. Son is bedside. Pt stated that when she is ready, she will inform the MD to increase medications. MD stated that the Pt is in pain, but not necessarily End of Life at this time. She desires to be on comfort care. Pt is concerned that her family is "saved" and is comforted in knowing that they are. [place] spent time with Pt as she spoke of her family history. CH is available for follow up as needed.    11/27/16 1600  Clinical Encounter Type  Visited With Patient;Patient and family together;Health care provider  Visit Type Initial;Spiritual support;ED  Referral From Nurse  Consult/Referral To Chaplain  Spiritual Encounters  Spiritual Needs Prayer;Emotional

## 2016-11-27 NOTE — Telephone Encounter (Signed)
Gevena Mart , nurse with [facility] called to report that patient has been having bleeding from peri area since discharged from hospital, first thought to be coming form vagina or urethra. Upon inspection today, it is thought that it coming form the rectum and patient reports that she feels that she needs to have bowel movement and does not. There is currently approximately a tablespoon of blood in her brief. She is on Eliquis. Asking if she needs to be seen or if medicine needs to be changed. Please advise

## 2016-11-27 NOTE — ED Notes (Signed)
Pt family refusing percocet and would like morphine for patient and for patient to be admitted.  MD notified and will speak with patient.

## 2016-11-27 NOTE — Progress Notes (Signed)
LCSW met with patient and obtained verbal consent to speak to her son and daughter in law to complete assessment.  LCSW will complete assessment and Fl2 will be started and Passr number will be requested.  [employer] LCSW [phone]

## 2016-11-27 NOTE — Progress Notes (Signed)
LCSW requested a PT consult and EDP will put in request, discussed what the family plan was as it changed. Unable to locate son and daughter in law.   LCSW will call [employer]- no answer this evening and request authorization Will follow up with PT and send info via the [place] and contact [facility] at family request and first choice  Kaelan Amble LCSW

## 2016-11-27 NOTE — Telephone Encounter (Signed)
Per Dr. Rogue Bussing, patient needs to go to the ED to be further evaluated. Hassan Rowan, would you mind calling and advising patient of this? Thanks!

## 2016-11-27 NOTE — H&P (Signed)
[place] at [place] NAME: Courtney [name]    MR#:  [id number]  DATE OF BIRTH:  1940/10/24  DATE OF ADMISSION:  11/27/2016  PRIMARY CARE PHYSICIAN: Rusty Aus, MD   REQUESTING/REFERRING PHYSICIAN: Dr. Corky Downs  CHIEF COMPLAINT:   Chief Complaint  Patient presents with  . Post-op Problem    HISTORY OF PRESENT ILLNESS:  Courtney [name]  is a 76 y.o. 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 Here patient has received 2 doses of morphine and Percocet and continues to be in tears due to the pain.  Discussed with patient with son and daughter-in-law at bedside. She mentions that she has gone through a lot and cannot take this pain anymore. Also with 2 tumors with multiple complications she does not want to go through any more chemotherapy. She has decided to have her pain well controlled and once pain is improved to go home with hospice or to a facility with hospice.  Her hemoglobin at this time is stable.   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 Biopsy of the mass showed a CD117 positive spindle cell tumor consistent with a GIST; 02/21/14 showed primary mass in mesentery with a peritoneal nodule in right posterior pelvis and nodule in pelvic cul de sac] ; Surgery April-May 2017 @[facility]   # GIST-June 2016- on Gleevec 366m alternating with 400  # FEB 2017- CT marked Progression of RP/pelvic sidewall LN; new paraesophageal LN; March 2017- Started Sutent 25 mg 3W-On 7 one week OFF [sec to SEs]  # July 27th PET- Significant PR; Sutent 2 w-On & 1 w OFF;  CT 11/27-STABLE DISEASE.    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 Prognostic/predictive value]      PAST MEDICAL HISTORY:   Past Medical History:  Diagnosis Date  . Anemia   . Anxiety   . Arthritis   . Cancer ([facility]    GIST  . Depression   . GERD (gastroesophageal reflux disease)   . GIST (gastrointestinal stroma tumor), malignant, colon ([place]   . History of hiatal hernia   . History of kidney stones   . Hypertension    NO MEDS NOW  . IBS (irritable bowel syndrome)   . Tremors of nervous system    HEAD    PAST SURGICAL HISTORY:   Past Surgical History:  Procedure Laterality Date  . ABDOMINAL HYSTERECTOMY    . BREAST BIOPSY Right   . CATARACT EXTRACTION W/PHACO Right 01/28/2015   Procedure: CATARACT EXTRACTION PHACO AND INTRAOCULAR LENS PLACEMENT (IOC);  Surgeon: SEstill Cotta MD;  Location: [facility] ORS;  Service: Ophthalmology;  Laterality: Right;  [place]:59.3 AP% 22.6 CDE 24.69 fluid pack lot ##[id number]  . CATARACT EXTRACTION W/PHACO Left 02/11/2015   Procedure: CATARACT EXTRACTION PHACO AND INTRAOCULAR LENS PLACEMENT (IOC);  Surgeon: SEstill Cotta MD;  Location: [facility] ORS;  Service: Ophthalmology;  Laterality: Left;  [place]01:18 AP% 22.8 CDE 32.38  fluid pack lot # [id number]  . CHOLECYSTECTOMY    . COLON SURGERY     COLOSTOMY AND REVERSAL  .  CYSTOSCOPY W/ RETROGRADES Bilateral 11/02/2016   Procedure: CYSTOSCOPY WITH RETROGRADE PYELOGRAM;  Surgeon: Hollice Espy, MD;  Location: [facility] ORS;  Service: Urology;  Laterality: Bilateral;  . CYSTOSCOPY WITH RETROGRADE PYELOGRAM, URETEROSCOPY AND STENT PLACEMENT Bilateral 11/14/2016   Procedure: cystoscopy with clot evacuation,fulguration of bleeders, and removal of bilateral stentsTUR bladder tumor;  Surgeon: Irine Seal, MD;  Location: [facility] ORS;  Service: Urology;  Laterality: Bilateral;  . CYSTOSCOPY WITH STENT PLACEMENT Bilateral 11/02/2016   Procedure: CYSTOSCOPY WITH STENT PLACEMENT;   Surgeon: Hollice Espy, MD;  Location: [facility] ORS;  Service: Urology;  Laterality: Bilateral;  . IR NEPHROSTOMY PLACEMENT LEFT  11/14/2016  . IR NEPHROSTOMY PLACEMENT RIGHT  11/14/2016  . STOMACH SURGERY     CANCER  . TRANSURETHRAL RESECTION OF BLADDER TUMOR N/A 11/02/2016   Procedure: TRANSURETHRAL RESECTION OF BLADDER TUMOR (TURBT) >5CM;  Surgeon: Hollice Espy, MD;  Location: [facility] ORS;  Service: Urology;  Laterality: N/A;    SOCIAL HISTORY:   Social History  Substance Use Topics  . Smoking status: Never Smoker  . Smokeless tobacco: Never Used  . Alcohol use No    FAMILY HISTORY:   Family History  Problem Relation Age of Onset  . Breast cancer Maternal Grandmother   . CAD Mother   . CAD Father     DRUG ALLERGIES:   Allergies  Allergen Reactions  . Other Swelling    Magic mouthwash  . Penicillins Swelling    Has patient had a PCN reaction causing immediate rash, facial/tongue/throat swelling, SOB or lightheadedness with hypotension: No Has patient had a PCN reaction causing severe rash involving mucus membranes or skin necrosis: No Has patient had a PCN reaction that required hospitalization: No Has patient had a PCN reaction occurring within the last 10 years: No If all of the above answers are "NO", then may proceed with Cephalosporin use.    . Ciprofloxacin Diarrhea    GI distress, abd cramping    REVIEW OF SYSTEMS:   Review of Systems  Constitutional: Positive for malaise/fatigue. Negative for chills and fever.  HENT: Negative for sore throat.   Eyes: Negative for blurred vision, double vision and pain.  Respiratory: Negative for cough, hemoptysis, shortness of breath and wheezing.   Cardiovascular: Negative for chest pain, palpitations, orthopnea and leg swelling.  Gastrointestinal: Positive for abdominal pain and blood in stool. Negative for constipation, diarrhea, heartburn, nausea and vomiting.  Genitourinary: Positive for flank pain and hematuria.  Negative for dysuria.  Musculoskeletal: Negative for back pain and joint pain.  Skin: Negative for rash.  Neurological: Positive for weakness. Negative for sensory change, speech change, focal weakness and headaches.  Endo/Heme/Allergies: Does not bruise/bleed easily.  Psychiatric/Behavioral: Positive for memory loss. Negative for depression. The patient is not nervous/anxious.     MEDICATIONS AT HOME:   Prior to Admission medications   Medication Sig Start Date End Date Taking? Authorizing Provider  megestrol (MEGACE) 40 MG tablet daily three days a week 05/13/15  Yes [provider]  potassium chloride SA (K-DUR,KLOR-CON) 20 MEQ tablet Take 1 tablet (20 mEq total) by mouth daily. 05/03/15  Yes Choksi, Delorise Shiner, MD  predniSONE (DELTASONE) 20 MG tablet Take 1 tablet (20 mg total) by mouth daily with breakfast. Once a day with food x 10 days. 11/24/16  Yes Cammie Sickle, MD  alum & mag hydroxide-simeth (MAALOX/MYLANTA) 200-200-20 MG/5ML suspension Take 30 mLs by mouth every 4 (four) hours as needed for indigestion. Patient not taking: Reported on 11/24/2016 11/17/16   Bridgett Larsson,  Sheppard Evens, MD  apixaban (ELIQUIS) 5 MG TABS tablet Take 1 tablet (5 mg total) by mouth daily. Patient not taking: Reported on 11/27/2016 11/18/16   Demetrios Loll, MD  docusate sodium (COLACE) 100 MG capsule Take 1 capsule (100 mg total) by mouth 2 (two) times daily. Patient not taking: Reported on 11/27/2016 11/17/16   Demetrios Loll, MD  ferrous sulfate 325 (65 FE) MG tablet Take 1 tablet (325 mg total) by mouth 3 (three) times daily with meals. Patient not taking: Reported on 11/27/2016 11/17/16   Demetrios Loll, MD  Iron-Vitamin C (VITRON-C) 65-125 MG TABS Take 2 tablets by mouth daily.     [provider]  ondansetron (ZOFRAN) 4 MG tablet Take 1 tablet (4 mg total) by mouth every 8 (eight) hours as needed for nausea or vomiting. 11/17/16   Demetrios Loll, MD  oxyCODONE-acetaminophen (PERCOCET) 7.5-325 MG tablet Take 1 tablet by  mouth every 4 (four) hours as needed for severe pain. 11/24/16   Cammie Sickle, MD  regorafenib (STIVARGA) 40 MG tablet Take 4 tablets (160 mg total) by mouth daily with breakfast on Day 1-21 of 28 Day cycle. Take with low fat meal. Patient not taking: Reported on 11/27/2016 11/26/16   Cammie Sickle, MD  sulfamethoxazole-trimethoprim (BACTRIM DS,SEPTRA DS) 800-160 MG tablet Take 1 tablet by mouth every 12 (twelve) hours. Patient not taking: Reported on 11/24/2016 11/17/16   Demetrios Loll, MD     VITAL SIGNS:  Blood pressure (!) 158/73, pulse (!) 104, temperature 98.5 F (36.9 C), resp. rate (!) 22, height 4' 11"  (1.499 m), weight 56.7 kg (125 lb), SpO2 100 %.  PHYSICAL EXAMINATION:  Physical Exam  GENERAL:  76 y.o.-year-old patient lying in the bed with no acute distress.  EYES: Pupils equal, round, reactive to light and accommodation. No scleral icterus. Extraocular muscles intact.  HEENT: Head atraumatic, normocephalic. Oropharynx and nasopharynx clear. No oropharyngeal erythema, moist oral mucosa  NECK:  Supple, no jugular venous distention. No thyroid enlargement, no tenderness.  LUNGS: Normal breath sounds bilaterally, no wheezing, rales, rhonchi. No use of accessory muscles of respiration.  CARDIOVASCULAR: S1, S2 normal. No murmurs, rubs, or gallops.  ABDOMEN: Soft, diffusely tender, nondistended. Bowel sounds present. No organomegaly or mass.  EXTREMITIES: No pedal edema, cyanosis, or clubbing. + 2 pedal & radial pulses b/l.   NEUROLOGIC: Cranial nerves II through XII are intact. No focal Motor or sensory deficits appreciated b/l PSYCHIATRIC: The patient is alert and awake. tearful SKIN: No obvious rash, lesion, or ulcer.   LABORATORY PANEL:   CBC  Recent Labs Lab 11/27/16 1204  WBC 10.3  HGB 10.0*  HCT 28.4*  PLT 748*   ------------------------------------------------------------------------------------------------------------------  Chemistries   Recent  Labs Lab 11/27/16 1204  NA 131*  K 4.4  CL 97*  CO2 23  GLUCOSE 95  BUN 27*  CREATININE 0.84  CALCIUM 9.7  AST 43*  ALT 51  ALKPHOS 137*  BILITOT 0.9   ------------------------------------------------------------------------------------------------------------------  Cardiac Enzymes No results for input(s): TROPONINI in the last 168 hours. ------------------------------------------------------------------------------------------------------------------  RADIOLOGY:  No results found.   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 Discussed regarding no vitals.  No blood work. DNR  * GI bleed * hematuria * Severe protein calorie malnutrition * Hyponatremia * Anemia of chronic disease  Placed on comfort measures. IV pain meds added.  All the records are reviewed and case discussed with  ED provider. Management plans discussed with the patient, family and they are in agreement.  CODE STATUS: DNR  TOTAL TIME TAKING CARE OF THIS PATIENT: 40 minutes.   Hillary Bow R M.D on 11/27/2016 at 3:38 PM  Between 7am to 6pm - Pager - [phone]  After 6pm go to [email or web address] - password [email or web address] [facility]  Office  [phone]  CC: Primary care physician; Rusty Aus, MD  Note: This dictation was prepared with Dragon dictation along with smaller phrase technology. Any transcriptional errors that result from this process are unintentional.

## 2016-11-28 MED ORDER — OXYCODONE HCL ER 15 MG PO T12A
15.0000 mg | EXTENDED_RELEASE_TABLET | Freq: Two times a day (BID) | ORAL | Status: DC
  Administered 2016-11-28 – 2016-11-29 (×3): 15 mg via ORAL
  Filled 2016-11-28 (×3): qty 1

## 2016-11-28 NOTE — Progress Notes (Signed)
[place] at [place] NAME: Courtney [name]    MR#:  [id number]  DATE OF BIRTH:  [birth year]  SUBJECTIVE:  CHIEF COMPLAINT:   Chief Complaint  Patient presents with  . Post-op Problem   Continues to complain of abdominal pain. Family at bedside  REVIEW OF SYSTEMS:    Review of Systems  Constitutional: Positive for malaise/fatigue. Negative for chills and fever.  HENT: Negative for sore throat.   Eyes: Negative for blurred vision, double vision and pain.  Respiratory: Negative for cough, hemoptysis, shortness of breath and wheezing.   Cardiovascular: Negative for chest pain, palpitations, orthopnea and leg swelling.  Gastrointestinal: Positive for abdominal pain, blood in stool and nausea. Negative for constipation, diarrhea, heartburn and vomiting.  Genitourinary: Positive for hematuria. Negative for dysuria.  Musculoskeletal: Positive for back pain. Negative for joint pain.  Skin: Negative for rash.  Neurological: Positive for weakness. Negative for sensory change, speech change, focal weakness and headaches.  Endo/Heme/Allergies: Does not bruise/bleed easily.  Psychiatric/Behavioral: Positive for depression and memory loss. The patient is not nervous/anxious.     DRUG ALLERGIES:   Allergies  Allergen Reactions  . Other Swelling    Magic mouthwash  . Penicillins Swelling    Has patient had a PCN reaction causing immediate rash, facial/tongue/throat swelling, SOB or lightheadedness with hypotension: No Has patient had a PCN reaction causing severe rash involving mucus membranes or skin necrosis: No Has patient had a PCN reaction that required hospitalization: No Has patient had a PCN reaction occurring within the last 10 years: No If all of the above answers are "NO", then may proceed with Cephalosporin use.    . Ciprofloxacin Diarrhea    GI distress, abd cramping    VITALS:  Blood pressure 133/71, pulse (!) 109, temperature 98.3 F  (36.8 C), temperature source Oral, resp. rate 20, height 4\' 11"  (1.499 m), weight 56.7 kg (125 lb), SpO2 96 %.  PHYSICAL EXAMINATION:   Physical Exam  GENERAL:  76 y.o.-year-old patient lying in the bed with no acute distress.  EYES: Pupils equal, round, reactive to light and accommodation. No scleral icterus. Extraocular muscles intact.  HEENT: Head atraumatic, normocephalic. Oropharynx and nasopharynx clear.  NECK:  Supple, no jugular venous distention. No thyroid enlargement, no tenderness.  LUNGS: Normal breath sounds bilaterally, no wheezing, rales, rhonchi. No use of accessory muscles of respiration.  CARDIOVASCULAR: S1, S2 normal. No murmurs, rubs, or gallops.  ABDOMEN: Soft, nontender, nondistended. Bowel sounds present. No organomegaly or mass.  EXTREMITIES: No edema b/l.    PSYCHIATRIC: The patient is alert and Awake. Tearful  LABORATORY PANEL:   CBC  Recent Labs Lab 11/27/16 1204  WBC 10.3  HGB 10.0*  HCT 28.4*  PLT 748*   ------------------------------------------------------------------------------------------------------------------ Chemistries   Recent Labs Lab 11/27/16 1204  NA 131*  K 4.4  CL 97*  CO2 23  GLUCOSE 95  BUN 27*  CREATININE 0.84  CALCIUM 9.7  AST 43*  ALT 51  ALKPHOS 137*  BILITOT 0.9   ------------------------------------------------------------------------------------------------------------------  Cardiac Enzymes No results for input(s): TROPONINI in the last 168 hours. ------------------------------------------------------------------------------------------------------------------  RADIOLOGY:  No results found.   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 Needing IV morphine. We will add OxyContin 15 mg twice a day. Continue  Roxanol  and IV morphine.  Discussed with patient and family regarding pain management, further care and discharge plans.  All the records are reviewed and case discussed with Care Management/Social Workerr. Management plans discussed with the patient, family and they are in agreement.  CODE STATUS: DNR  DVT Prophylaxis: SCDs  TOTAL TIME TAKING CARE OF THIS PATIENT: 35 minutes.  >50% Time spent in discussing with patient and family and coordinating care at bedside.  POSSIBLE D/C IN 1-2 DAYS, DEPENDING ON CLINICAL CONDITION.  Hillary Bow R M.D on 11/28/2016 at 10:48 AM  Between 7am to 6pm - Pager - [phone]  After 6pm go to [email or web address] - password [email or web address] [facility]  Office  [phone]  CC: Primary care physician; Rusty Aus, MD  Note: This dictation was prepared with Dragon dictation along with smaller phrase technology. Any transcriptional errors that result from this process are unintentional.

## 2016-11-28 NOTE — Progress Notes (Signed)
LCSW consulted and met with patient and reviewed PT consultation report. LCSW did call [facility] as per the patients son wishes and their is no admission director until Monday. In reading the notes this worker will not proceed with any SNF placement or [place] authorization.  It is my understanding this patient wants no PT therapy or SNF placement she just wants comfort measures and will await her Hospice consultation on Monday. No further action required.  Deshane Cotroneo LCSW

## 2016-11-28 NOTE — Progress Notes (Signed)
PT Cancellation Note  Patient Details Name: Courtney [name] MRN: [id number] DOB: 1940/12/06   Cancelled Treatment:    Reason Eval/Treat Not Completed: Medical issues which prohibited therapy. Chart reviewed and RN consulted. Per oncology note pt states that she is not interested in further treatment options given the significant decline in quality of life/performance status. Oncologist recommending discontinuation of further therapy at this time and focusing on pain control. Oncologist recommending hospice involvement. PT spoke with patient who had a hard time articulating her wishes but stated that pain control is her number one priority. Spoke with son Gwyndolyn Saxon over the phone who agreed that pain control/comfort are the main priority. Given patient's wishes it does not appear that she desire to pursue aggressive measures such as physical therapy at a skilled rehab facility. It is also unclear if pt could tolerate aggressive therapy at this time. She cannot have hospice involvement if placed in SNF. Will complete current order given conversation with patient and son. If plan of care changes during admission please enter new order and PT evaluation can be performed.   Lyndel Safe Blanchie Zeleznik PT, DPT   Nia Nathaniel 11/28/2016, 8:41 AM

## 2016-11-29 MED ORDER — OXYCODONE HCL ER 10 MG PO T12A
20.0000 mg | EXTENDED_RELEASE_TABLET | Freq: Two times a day (BID) | ORAL | Status: DC
  Administered 2016-11-29: 22:00:00 20 mg via ORAL
  Filled 2016-11-29 (×2): qty 2

## 2016-11-29 MED ORDER — ORAL CARE MOUTH RINSE
15.0000 mL | Freq: Two times a day (BID) | OROMUCOSAL | Status: DC
  Administered 2016-11-29 – 2016-11-30 (×2): 15 mL via OROMUCOSAL

## 2016-11-29 NOTE — Progress Notes (Signed)
[place] at [place] NAME: Courtney [name]    MR#:  [id number]  DATE OF BIRTH:  April 08, 1940  SUBJECTIVE:  CHIEF COMPLAINT:   Chief Complaint  Patient presents with  . Post-op Problem   Still has pain in spite of adding OxyContin. In tears due to pain.  REVIEW OF SYSTEMS:    Review of Systems  Constitutional: Positive for malaise/fatigue. Negative for chills and fever.  HENT: Negative for sore throat.   Eyes: Negative for blurred vision, double vision and pain.  Respiratory: Negative for cough, hemoptysis, shortness of breath and wheezing.   Cardiovascular: Negative for chest pain, palpitations, orthopnea and leg swelling.  Gastrointestinal: Positive for abdominal pain, blood in stool and nausea. Negative for constipation, diarrhea, heartburn and vomiting.  Genitourinary: Positive for hematuria. Negative for dysuria.  Musculoskeletal: Positive for back pain. Negative for joint pain.  Skin: Negative for rash.  Neurological: Positive for weakness. Negative for sensory change, speech change, focal weakness and headaches.  Endo/Heme/Allergies: Does not bruise/bleed easily.  Psychiatric/Behavioral: Positive for depression and memory loss. The patient is not nervous/anxious.     DRUG ALLERGIES:   Allergies  Allergen Reactions  . Other Swelling    Magic mouthwash  . Penicillins Swelling    Has patient had a PCN reaction causing immediate rash, facial/tongue/throat swelling, SOB or lightheadedness with hypotension: No Has patient had a PCN reaction causing severe rash involving mucus membranes or skin necrosis: No Has patient had a PCN reaction that required hospitalization: No Has patient had a PCN reaction occurring within the last 10 years: No If all of the above answers are "NO", then may proceed with Cephalosporin use.    . Ciprofloxacin Diarrhea    GI distress, abd cramping    VITALS:  Blood pressure 116/90, pulse (!) 110, temperature  98.7 F (37.1 C), temperature source Oral, resp. rate (!) 21, height 4\' 11"  (1.499 m), weight 56.7 kg (125 lb), SpO2 99 %.  PHYSICAL EXAMINATION:   Physical Exam  GENERAL:  76 y.o.-year-old patient lying in the bed with no acute distress.  EYES: Pupils equal, round, reactive to light and accommodation. No scleral icterus. Extraocular muscles intact.  HEENT: Head atraumatic, normocephalic. Oropharynx and nasopharynx clear.  NECK:  Supple, no jugular venous distention. No thyroid enlargement, no tenderness.  LUNGS: Normal breath sounds bilaterally, no wheezing, rales, rhonchi. No use of accessory muscles of respiration.  CARDIOVASCULAR: S1, S2 normal. No murmurs, rubs, or gallops.  ABDOMEN: Soft, nontender, nondistended. Bowel sounds present. No organomegaly or mass.  EXTREMITIES: No edema b/l.    PSYCHIATRIC: The patient is alert and Awake. Tearful  LABORATORY PANEL:   CBC  Recent Labs Lab 11/27/16 1204  WBC 10.3  HGB 10.0*  HCT 28.4*  PLT 748*   ------------------------------------------------------------------------------------------------------------------ Chemistries   Recent Labs Lab 11/27/16 1204  NA 131*  K 4.4  CL 97*  CO2 23  GLUCOSE 95  BUN 27*  CREATININE 0.84  CALCIUM 9.7  AST 43*  ALT 51  ALKPHOS 137*  BILITOT 0.9   ------------------------------------------------------------------------------------------------------------------  Cardiac Enzymes No results for input(s): TROPONINI in the last 168 hours. ------------------------------------------------------------------------------------------------------------------  RADIOLOGY:  No results found.   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 Needing IV morphine.  Increase OxyContin 20 mg twice a  day.  Discussed with patient and family regarding pain management, further care and discharge plans.  All the records are reviewed and case discussed with Care Management/Social Worker Management plans discussed with the patient, family and they are in agreement.  CODE STATUS: DNR  DVT Prophylaxis: SCDs  TOTAL TIME TAKING CARE OF THIS PATIENT: 35 minutes.  >50% Time spent in discussing with patient and family and coordinating care at bedside.  POSSIBLE D/C IN 1-2 DAYS, DEPENDING ON CLINICAL CONDITION.  Hillary Bow R M.D on 11/29/2016 at 12:07 PM  Between 7am to 6pm - Pager - [phone]  After 6pm go to [email or web address] - password [email or web address] [facility]  Office  [phone]  CC: Primary care physician; Rusty Aus, MD  Note: This dictation was prepared with Dragon dictation along with smaller phrase technology. Any transcriptional errors that result from this process are unintentional.

## 2016-11-29 NOTE — Plan of Care (Signed)
Problem: Safety: Goal: Ability to remain free from injury will improve Outcome: Not Progressing Pt lethargic, alert to self.   Problem: Pain Managment: Goal: General experience of comfort will improve Outcome: Progressing One dose of morphine IV and one dose of morphine concentrate given during the shift for back pain with improvement.  Problem: Physical Regulation: Goal: Complications related to the disease process, condition or treatment will be avoided or minimized Outcome: Progressing Pt had small amount of dark blood in pull-up.

## 2016-11-30 MED ORDER — OXYCODONE HCL ER 20 MG PO T12A: 20.0000 mg | EXTENDED_RELEASE_TABLET | Freq: Two times a day (BID) | ORAL | Status: AC

## 2016-11-30 MED ORDER — LORAZEPAM 2 MG/ML PO CONC: 1.0000 mg | ORAL | 0 refills | Status: AC | PRN

## 2016-11-30 MED ORDER — MORPHINE SULFATE (CONCENTRATE) 10 MG/0.5ML PO SOLN: 5.0000 mg | ORAL | Status: AC | PRN

## 2016-11-30 NOTE — Progress Notes (Signed)
Pt is being discharged to hospice home. Discharge packet in pt's slot. Awaiting EMS.

## 2016-11-30 NOTE — Discharge Summary (Signed)
[place] [facility] [place] NAME: Courtney [name]    MR#:  [id number]  DATE OF BIRTH:  23-Mar-1941  DATE OF ADMISSION:  11/27/2016 ADMITTING PHYSICIAN: Hillary Bow, MD  DATE OF DISCHARGE: 11/30/2016  PRIMARY CARE PHYSICIAN: Rusty Aus, MD   ADMISSION DIAGNOSIS:  Cancer associated pain [G89.3]  DISCHARGE DIAGNOSIS:  Active Problems:   Palliative care status   Inadequate pain control   DNR (do not resuscitate)   Abdominal pain   SECONDARY DIAGNOSIS:   Past Medical History:  Diagnosis Date  . Anemia   . Anxiety   . Arthritis   . Cancer [facility])    GIST  . Depression   . GERD (gastroesophageal reflux disease)   . 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 History of hiatal hernia   . History of kidney stones   . Hypertension    NO MEDS NOW  . IBS (irritable bowel syndrome)   . Tremors of nervous system    HEAD     ADMITTING HISTORY   HISTORY OF PRESENT ILLNESS:  Courtney [name]  is a 76 y.o. 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 Here patient has received 2 doses of morphine and Percocet and continues to be in tears due to the pain.  Discussed with patient with son and daughter-in-law at bedside. She mentions that she has gone through a lot and cannot take this pain anymore. Also with 2 tumors with multiple complications she does not want to go through any more chemotherapy. She has decided to have her pain well controlled and once pain is improved to go home with hospice or to a facility with hospice.  Her hemoglobin at this time is stable.    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 Patient was started on oral morphine and IV morphine. With this pain was not adequately controlled and she was started on OxyContin 20 mg twice a day. This has helped her pain well. Presently patient is comfortable. She has not eaten anything for over a day. Drowsy. Patient seems to be at end of life at this time. Discussed with family. Patient will be discharged to hospice home for end-of-life care as family is requesting. Patient is comfortable at this time.  CONSULTS OBTAINED:    DRUG ALLERGIES:   Allergies  Allergen Reactions  . Other Swelling    Magic mouthwash  . Penicillins Swelling    Has patient had a PCN reaction causing immediate rash, facial/tongue/throat swelling, SOB or lightheadedness with hypotension: No Has patient had a PCN reaction causing severe rash involving mucus membranes or skin necrosis: No Has patient had a PCN reaction that required hospitalization: No Has patient had a PCN reaction occurring within the last 10 years: No If all of the above answers are "NO", then may proceed with Cephalosporin use.    . Ciprofloxacin Diarrhea    GI distress, abd cramping    DISCHARGE MEDICATIONS:   Current Discharge Medication List    START taking these medications   Details  LORazepam (ATIVAN) 2 MG/ML concentrated solution Place 0.5  mLs (1 mg total) under the tongue every 4 (four) hours as needed for anxiety. Qty: 30 mL, Refills: 0    Morphine Sulfate (MORPHINE CONCENTRATE) 10 MG/0.5ML SOLN concentrated solution Take 0.25 mLs (5 mg total) by mouth every 2 (two) hours as needed for moderate pain (or dyspnea). Qty: 180 mL    oxyCODONE (OXYCONTIN) 20 mg 12 hr tablet Take 1 tablet (20 mg total) by mouth every 12 (twelve) hours. Qty: 14 tablet      CONTINUE these medications which have NOT CHANGED   Details  ondansetron (ZOFRAN) 4 MG tablet Take 1 tablet (4 mg total)  by mouth every 8 (eight) hours as needed for nausea or vomiting. Qty: 20 tablet, Refills: 0      STOP taking these medications     megestrol (MEGACE) 40 MG tablet      potassium chloride SA (K-DUR,KLOR-CON) 20 MEQ tablet      predniSONE (DELTASONE) 20 MG tablet      alum & mag hydroxide-simeth (MAALOX/MYLANTA) 200-200-20 MG/5ML suspension      apixaban (ELIQUIS) 5 MG TABS tablet      docusate sodium (COLACE) 100 MG capsule      ferrous sulfate 325 (65 FE) MG tablet      Iron-Vitamin C (VITRON-C) 65-125 MG TABS      oxyCODONE-acetaminophen (PERCOCET) 7.5-325 MG tablet      regorafenib (STIVARGA) 40 MG tablet      sulfamethoxazole-trimethoprim (BACTRIM DS,SEPTRA DS) 800-160 MG tablet         Today   VITAL SIGNS:  Blood pressure (!) 100/54, pulse (!) 130, temperature 98.2 F (36.8 C), temperature source Oral, resp. rate 11, height 4\' 11"  (1.499 m), weight 56.7 kg (125 lb), SpO2 97 %.  I/O:   Intake/Output Summary (Last 24 hours) at 11/30/16 1020 Last data filed at 11/30/16 0600  Gross per 24 hour  Intake               60 ml  Output              380 ml  Net             -320 ml    PHYSICAL EXAMINATION:  Physical Exam  GENERAL:  76 y.o.-year-old patient lying in the bed with no acute distress.  LUNGS: Normal breath sounds bilaterally CARDIOVASCULAR: S1, S2 tachycardic PSYCHIATRIC: The patient is drowzy   DATA REVIEW:   CBC  Recent Labs Lab 11/27/16 1204  WBC 10.3  HGB 10.0*  HCT 28.4*  PLT 748*    Chemistries   Recent Labs Lab 11/27/16 1204  NA 131*  K 4.4  CL 97*  CO2 23  GLUCOSE 95  BUN 27*  CREATININE 0.84  CALCIUM 9.7  AST 43*  ALT 51  ALKPHOS 137*  BILITOT 0.9    Cardiac Enzymes No results for input(s): TROPONINI in the last 168 hours.  Microbiology Results  Results for orders placed or performed during the hospital encounter of 11/07/16  Urine culture     Status: None   Collection Time: 11/07/16 10:23 AM  Result Value Ref  Range Status   Specimen Description URINE, RANDOM  Final   Special Requests NONE  Final   Culture   Final    NO GROWTH Performed at [facility], [place] [address] [place], [place] [address]    Report Status 11/08/2016 FINAL  Final  Urine Culture     Status: Abnormal   Collection Time: 11/12/16  6:00  PM  Result Value Ref Range Status   Specimen Description URINE, RANDOM  Final   Special Requests NONE  Final   Culture (A)  Final    >=100,000 COLONIES/mL STAPHYLOCOCCUS SPECIES (COAGULASE NEGATIVE)   Report Status 11/15/2016 FINAL  Final   Organism ID, Bacteria STAPHYLOCOCCUS SPECIES (COAGULASE NEGATIVE) (A)  Final      Susceptibility   Staphylococcus species (coagulase negative) - MIC*    CIPROFLOXACIN >=8 RESISTANT Resistant     GENTAMICIN <=0.5 SENSITIVE Sensitive     NITROFURANTOIN <=16 SENSITIVE Sensitive     OXACILLIN >=4 RESISTANT Resistant     TETRACYCLINE <=1 SENSITIVE Sensitive     VANCOMYCIN 2 SENSITIVE Sensitive     TRIMETH/SULFA <=10 SENSITIVE Sensitive     CLINDAMYCIN <=0.25 SENSITIVE Sensitive     RIFAMPIN <=0.5 SENSITIVE Sensitive     Inducible Clindamycin NEGATIVE Sensitive     * >=100,000 COLONIES/mL STAPHYLOCOCCUS SPECIES (COAGULASE NEGATIVE)    RADIOLOGY:  No results found.  Follow up with PCP in 1 week.  Management plans discussed with the patient, family and they are in agreement.  CODE STATUS:     Code Status Orders        Start     Ordered   11/27/16 1537  Do not attempt resuscitation (DNR)  Continuous    Question Answer Comment  In the event of cardiac or respiratory ARREST Do not call a "code blue"   In the event of cardiac or respiratory ARREST Do not perform Intubation, CPR, defibrillation or ACLS   In the event of cardiac or respiratory ARREST Use medication by any route, position, wound care, and other measures to relive pain and suffering. May use oxygen, suction and manual treatment of airway obstruction as needed for comfort.       11/27/16 1537    Code Status History    Date Active Date Inactive Code Status Order ID Comments User Context   11/07/2016  5:30 PM 11/18/2016  6:28 PM Full Code [id number]  Loletha Grayer, MD ED    Advance Directive Documentation     Most Recent Value  Type of Advance Directive  Healthcare Power of [place], Living will  Pre-existing out of facility DNR order (yellow form or pink MOST form)  -  "MOST" Form in Place?  -      TOTAL TIME TAKING CARE OF THIS PATIENT ON DAY OF DISCHARGE: more than 30 minutes.   Hillary Bow R M.D on 11/30/2016 at 10:20 AM  Between 7am to 6pm - Pager - [phone]  After 6pm go to [email or web address] - password [email or web address] [place] [facility]  Office  [phone]  CC: Primary care physician; Rusty Aus, MD  Note: This dictation was prepared with Dragon dictation along with smaller phrase technology. Any transcriptional errors that result from this process are unintentional.

## 2016-11-30 NOTE — Progress Notes (Signed)
Palliative Medicine consult noted. Due to high referral volume, there may be a delay seeing this patient. Please call the Palliative Medicine Team office at [phone] if recommendations are needed in the interim.  Thank you for inviting [name] to see this patient.  Marjie Skiff Rhodesia Stanger, RN, BSN, [facility] 11/30/2016 9:57 AM Cell [phone] 8:00-4:00 Monday-Friday Office [phone]

## 2016-11-30 NOTE — Plan of Care (Signed)
Problem: Education: Goal: Knowledge of [facility] Education information/materials will improve Outcome: Progressing Family members at bedside to visit patient. Family very appreciative of care being rendered by staff to patient. Patient remained calm and comfortable. Not in any form of distress. Asked the nurse "When will I gonna go to heaven?". Medicated patient with scheduled pain medicine, tolerated well. Needs attended. Patient has been sleeping comfortably.  Problem: Safety: Goal: Ability to remain free from injury will improve Outcome: Progressing Bed alarm turn on. Safety measures in place, call light button within reach.   Problem: Pain Managment: Goal: General experience of comfort will improve Outcome: Progressing Remained comfortable in bed. Not in any form of distress. Repositioning done.   Problem: Education: Goal: Knowledge of the prescribed therapeutic regimen will improve Outcome: Progressing Patient is well aware that she can take medicines to alleviate her body pain. Patient at the moment does not exhibit signs of pain. Kept monitored.

## 2016-11-30 NOTE — Progress Notes (Signed)
New hospice home referral received from [facility]. Patient is a 76 year old woman with known past medical history of GIST and bladder tumor with recent nephrostomy tube placement, admitted to [facility] on 9/7 for treatment of intractable pain. Family was hoping for her to discharge to a rehab facility to be followed by Palliative, but she has declined over the weekend with very little oral intake,and increased weakness. She was started on Oxycontin 20 mg bid with PRN doses of IV morphine and liquid morphine used for breakthrough on 9/9. Her pain has ranged from 5-8 when awake. Writer met int he patient's room with her husband Courtney [name], brother Courtney [name], and sisters Courtney [name] and Courtney [name] to initiate education regarding hospice services, philosophy and team approach to care with good understanding voiced. Questions answered, consents signed by Courtney [name]. Courtney [name] did reach patient's son Courtney [name] by phone and he also agreed to the transfer to the hospice home. Courtney [name] is a Psychologist, sport and exercise and was unable to come to the hospital. Patient was asleep thorough out the visit and did not participate in the conversation. Patient information faxed to referral, report called to the hospice home. EMS notified for transport. Signed DNR in place in discharge packet. Hospital care team all aware of and in agreement with plan. Thank you. Flo Shanks RN, BSN, [facility] [place], hospital Liaison [phone] c

## 2016-11-30 NOTE — Clinical Social Work Note (Addendum)
CSW received referral for Hospice Home placement.  Patient has been reviewed by hospice facility and they can accept patient today.  Patient to be d/c'ed today to [facility].  Patient and family agreeable to plans will transport via ems hospice home RN to call report.  Evette Cristal, MSW, [place]

## 2016-11-30 NOTE — Care Management (Signed)
Informed that patient is now comfort care.  Craige Cotta with [facility] informed.  Palliative consult is pending for hospice and pain.

## 2016-12-01 ENCOUNTER — Other Ambulatory Visit: Payer: PPO

## 2016-12-01 ENCOUNTER — Ambulatory Visit: Payer: PPO | Admitting: Internal Medicine

## 2016-12-01 ENCOUNTER — Ambulatory Visit: Payer: PPO

## 2016-12-01 DIAGNOSIS — C679 Malignant neoplasm of bladder, unspecified: Secondary | ICD-10-CM | POA: Diagnosis not present

## 2016-12-01 LAB — BPAM RBC
BLOOD PRODUCT EXPIRATION DATE: 201810042359
Blood Product Expiration Date: 201810042359
UNIT TYPE AND RH: 5100
Unit Type and Rh: 5100

## 2016-12-01 LAB — TYPE AND SCREEN
ABO/RH(D): A POS
Antibody Screen: POSITIVE
UNIT DIVISION: 0
UNIT DIVISION: 0

## 2016-12-03 ENCOUNTER — Encounter: Payer: Self-pay | Admitting: Internal Medicine

## 2016-12-03 LAB — SURGICAL PATHOLOGY

## 2016-12-07 DIAGNOSIS — I82402 Acute embolism and thrombosis of unspecified deep veins of left lower extremity: Secondary | ICD-10-CM | POA: Diagnosis not present

## 2016-12-07 DIAGNOSIS — D414 Neoplasm of uncertain behavior of bladder: Secondary | ICD-10-CM | POA: Diagnosis not present

## 2016-12-07 DIAGNOSIS — D500 Iron deficiency anemia secondary to blood loss (chronic): Secondary | ICD-10-CM | POA: Diagnosis not present

## 2016-12-07 DIAGNOSIS — Z436 Encounter for attention to other artificial openings of urinary tract: Secondary | ICD-10-CM | POA: Diagnosis not present

## 2016-12-21 ENCOUNTER — Other Ambulatory Visit: Payer: Self-pay | Admitting: Internal Medicine

## 2016-12-21 NOTE — Telephone Encounter (Signed)
Oral Oncology Patient Advocate Encounter   Per notes on patient, I called [facility] and asked that Ms. Cobin's medication be canceled due to her decline in health.  If things change we can call back and get it started.    [facility] Patient Advocate [phone] 12/03/16 11:31 AM

## 2016-12-21 DEATH — deceased

## 2017-01-01 ENCOUNTER — Other Ambulatory Visit: Payer: Self-pay | Admitting: Nurse Practitioner
